# Patient Record
Sex: Male | Born: 1995
Health system: Southern US, Community
[De-identification: ages and names within clinical notes are randomized; demographics above are authoritative.]

## PROBLEM LIST (undated history)

## (undated) DIAGNOSIS — K6289 Other specified diseases of anus and rectum: Secondary | ICD-10-CM

## (undated) DIAGNOSIS — IMO0002 Reserved for concepts with insufficient information to code with codable children: Secondary | ICD-10-CM

## (undated) DIAGNOSIS — E669 Obesity, unspecified: Secondary | ICD-10-CM

## (undated) DIAGNOSIS — H539 Unspecified visual disturbance: Secondary | ICD-10-CM

## (undated) DIAGNOSIS — J189 Pneumonia, unspecified organism: Secondary | ICD-10-CM

## (undated) DIAGNOSIS — B20 Human immunodeficiency virus [HIV] disease: Secondary | ICD-10-CM

## (undated) DIAGNOSIS — F329 Major depressive disorder, single episode, unspecified: Secondary | ICD-10-CM

## (undated) DIAGNOSIS — L0293 Carbuncle, unspecified: Secondary | ICD-10-CM

## (undated) DIAGNOSIS — L0292 Furuncle, unspecified: Secondary | ICD-10-CM

## (undated) HISTORY — DX: Reserved for concepts with insufficient information to code with codable children: IMO0002

## (undated) HISTORY — DX: Major depressive disorder, single episode, unspecified: F32.9

## (undated) HISTORY — PX: EXCISIONAL HEMORRHOIDECTOMY: SHX1541

---
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
## 2015-01-09 ENCOUNTER — Ambulatory Visit: Payer: Medicaid Other | Admitting: Internal Medicine

## 2015-02-03 ENCOUNTER — Encounter (HOSPITAL_COMMUNITY): Payer: Self-pay | Admitting: *Deleted

## 2015-02-03 ENCOUNTER — Emergency Department (HOSPITAL_COMMUNITY)
Admission: EM | Admit: 2015-02-03 | Discharge: 2015-02-03 | Disposition: A | Discharge status: Home / Self Care | Payer: Medicaid Other | Attending: Emergency Medicine | Admitting: Emergency Medicine

## 2015-02-03 DIAGNOSIS — E119 Type 2 diabetes mellitus without complications: Secondary | ICD-10-CM | POA: Insufficient documentation

## 2015-02-03 DIAGNOSIS — J069 Acute upper respiratory infection, unspecified: Secondary | ICD-10-CM | POA: Diagnosis not present

## 2015-02-03 DIAGNOSIS — Z8614 Personal history of Methicillin resistant Staphylococcus aureus infection: Secondary | ICD-10-CM | POA: Insufficient documentation

## 2015-02-03 DIAGNOSIS — E669 Obesity, unspecified: Secondary | ICD-10-CM | POA: Diagnosis not present

## 2015-02-03 DIAGNOSIS — Z79899 Other long term (current) drug therapy: Secondary | ICD-10-CM | POA: Insufficient documentation

## 2015-02-03 DIAGNOSIS — H578 Other specified disorders of eye and adnexa: Secondary | ICD-10-CM | POA: Diagnosis present

## 2015-02-03 DIAGNOSIS — H109 Unspecified conjunctivitis: Secondary | ICD-10-CM | POA: Insufficient documentation

## 2015-02-03 DIAGNOSIS — Z8701 Personal history of pneumonia (recurrent): Secondary | ICD-10-CM | POA: Diagnosis not present

## 2015-02-03 DIAGNOSIS — J45909 Unspecified asthma, uncomplicated: Secondary | ICD-10-CM | POA: Diagnosis not present

## 2015-02-03 DIAGNOSIS — Z87891 Personal history of nicotine dependence: Secondary | ICD-10-CM | POA: Insufficient documentation

## 2015-02-03 DIAGNOSIS — Z21 Asymptomatic human immunodeficiency virus [HIV] infection status: Secondary | ICD-10-CM | POA: Diagnosis not present

## 2015-02-03 DIAGNOSIS — Z794 Long term (current) use of insulin: Secondary | ICD-10-CM | POA: Diagnosis not present

## 2015-02-03 MED ORDER — POLYMYXIN B-TRIMETHOPRIM 10000-0.1 UNIT/ML-% OP SOLN: 1.0000 [drp] | OPHTHALMIC | Status: DC

## 2015-02-03 NOTE — Discharge Instructions (Signed)
Read the information below.  Use the prescribed medication as directed.  Please discuss all new medications with your pharmacist.  You may return to the Emergency Department at any time for worsening condition or any new symptoms that concern you.     If you develop worsening pain in your eye, change in your vision, swelling around your eye, difficulty moving your eye, or fevers greater than 100.4, see your eye doctor or return to the Emergency Department immediately for a recheck.    ° ° °Conjunctivitis °Conjunctivitis is commonly called "pink eye." Conjunctivitis can be caused by bacterial or viral infection, allergies, or injuries. There is usually redness of the lining of the eye, itching, discomfort, and sometimes discharge. There may be deposits of matter along the eyelids. A viral infection usually causes a watery discharge, while a bacterial infection causes a yellowish, thick discharge. Pink eye is very contagious and spreads by direct contact. °You may be given antibiotic eyedrops as part of your treatment. Before using your eye medicine, remove all drainage from the eye by washing gently with warm water and cotton balls. Continue to use the medication until you have awakened 2 mornings in a row without discharge from the eye. Do not rub your eye. This increases the irritation and helps spread infection. Use separate towels from other household members. Wash your hands with soap and water before and after touching your eyes. Use cold compresses to reduce pain and sunglasses to relieve irritation from light. Do not wear contact lenses or wear eye makeup until the infection is gone. °SEEK MEDICAL CARE IF:  °· Your symptoms are not better after 3 days of treatment. °· You have increased pain or trouble seeing. °· The outer eyelids become very red or swollen. °Document Released: 06/03/2004 Document Revised: 07/19/2011 Document Reviewed: 04/26/2005 °ExitCare® Patient Information ©2015 [employer]. This  information is not intended to replace advice given to you by your health care provider. Make sure you discuss any questions you have with your health care provider. ° °

## 2015-02-03 NOTE — ED Provider Notes (Signed)
CSN: [id number]     Arrival date & time 02/03/15  4098 History   This chart was scribed for non-physician practitioner, Trixie Dredge, PA-C, working with Elwin Mocha, MD by Marica Otter, ED Scribe. This patient was seen in room TR10C/TR10C and the patient's care was started at 9:09 AM.   Chief Complaint  Patient presents with  . Eye Drainage   The history is provided by the patient. No language interpreter was used.   PCP: Grayce Sessions, NP HPI Comments: TADAN SHILL is a 19 y.o. male, with PMHx noted below including DM (pt reports glucose levels are controlled (120s per patient)and is compliant with meds), HIV disease (Pt's CD4 count was 470 in 11/2014), who presents to the Emergency Department complaining of sudden onset atraumatic left eye itching onset one week ago following a visit with his opthamologist at [facility] Pt notes the left eye became red and painful this morning. Pt reports using warm compresses at home without relief. Pt further notes a Hx of pink eye and reports his current Sx are consistent with pink eye. Pt also reports an improving cold with associated cough and runny nose onset approximately one week ago. Pt denies SOB, chest pain, fever, Sx associated with right eye, ear pain, fever, or chills.    Past Medical History  Diagnosis Date  . Asthma   . Diabetes mellitus   . Vision abnormalities   . Obesity   . IUGR (intrauterine growth restriction)   . Intrauterine drug exposure   . Proctitis   . HCAP (healthcare-associated pneumonia) 08/31/2014  . HIV disease 09/03/2014  . Recurrent boils     MRSA and MSSA   Past Surgical History  Procedure Laterality Date  . Excisional hemorrhoidectomy    . Foot surgery  1999    ingrown toenail removal   Family History  Problem Relation Age of Onset  . Drug abuse Mother   . Heart disease Mother   . Alcohol abuse Father   . Diabetes Paternal Aunt   . Arthritis Paternal Grandmother   . Stroke Paternal Grandfather   .  Diabetes Paternal Grandfather    Social History  Substance Use Topics  . Smoking status: Former Smoker    Types: Cigarettes    Quit date: 09/03/2014  . Smokeless tobacco: Never Used  . Alcohol Use: No    Review of Systems  Constitutional: Negative for fever and chills.  HENT: Positive for rhinorrhea. Negative for ear pain.   Eyes: Positive for pain, discharge, redness and itching (left eye). Negative for photophobia and visual disturbance.  Respiratory: Positive for cough. Negative for shortness of breath.   Cardiovascular: Negative for chest pain.  Musculoskeletal: Negative for myalgias, neck pain and neck stiffness.  Skin: Negative for rash.  Allergic/Immunologic: Positive for immunocompromised state.  Hematological: Does not bruise/bleed easily.  Psychiatric/Behavioral: Negative for self-injury.      Allergies  Review of patient's allergies indicates no known allergies.  Home Medications   Prior to Admission medications   Medication Sig Start Date End Date Taking? Authorizing Mycal Conde  albuterol (PROVENTIL HFA;VENTOLIN HFA) 108 (90 BASE) MCG/ACT inhaler Inhale 2 puffs into the lungs every 6 (six) hours as needed for wheezing or shortness of breath.     Historical Shaylynne Lunt, MD  elvitegravir-cobicistat-emtricitabine-tenofovir (GENVOYA) 150-150-200-10 MG TABS tablet Take 1 tablet by mouth daily with breakfast. 09/24/14   Cliffton Asters, MD  gabapentin (NEURONTIN) 100 MG capsule Take 2 capsules (200 mg total) by mouth 3 (three) times daily.  10/10/14   Cliffton Asters, MD  insulin aspart (NOVOLOG FLEXPEN) 100 UNIT/ML FlexPen Use up to 50 units daily 11/12/14   Dessa Phi, MD  Insulin Glargine (LANTUS SOLOSTAR) 100 UNIT/ML Solostar Pen Use up to 50 units daily 11/12/14   Dessa Phi, MD  ondansetron (ZOFRAN ODT) 4 MG disintegrating tablet Take 1 tablet (4 mg total) by mouth every 8 (eight) hours as needed for nausea or vomiting. Patient not taking: Reported on 09/10/2014 08/28/14    Vanetta Mulders, MD  potassium chloride SA (K-DUR,KLOR-CON) 20 MEQ tablet Take 1 tablet (20 mEq total) by mouth daily. Patient not taking: Reported on 09/10/2014 09/04/14   Dorothea Ogle, MD  ramipril (ALTACE) 1.25 MG capsule Take 1 capsule (1.25 mg total) by mouth daily. Patient not taking: Reported on 09/24/2014 09/04/14   Dorothea Ogle, MD   Triage Vitals: BP 123/72 mmHg  Pulse 78  Temp(Src) 97.8 F (36.6 C) (Oral)  Resp 18  Ht  (1.753 m)  Wt 180 lb (81.647 kg)  BMI 26.57 kg/m2  SpO2 100% Physical Exam  Constitutional: He appears well-developed and well-nourished. No distress.  HENT:  Head: Normocephalic and atraumatic.  Mouth/Throat: Oropharynx is clear and moist. No oropharyngeal exudate.  Eyes: EOM are normal. Pupils are equal, round, and reactive to light. Right eye exhibits no discharge. Left eye exhibits discharge (Dry crusted white discharge. ). Left conjunctiva is injected.  Left Eye: No periorbital erythema, edema and tenderness   Neck: Normal range of motion. Neck supple.  Cardiovascular: Normal rate and regular rhythm.   Pulmonary/Chest: Effort normal and breath sounds normal. No stridor. No respiratory distress. He has no wheezes. He has no rales.  Lymphadenopathy:    He has no cervical adenopathy.  Neurological: He is alert.  Skin: He is not diaphoretic.  Nursing note and vitals reviewed.   ED Course  Procedures (including critical care time) DIAGNOSTIC STUDIES: Oxygen Saturation is 100% on RA, nl by my interpretation.    COORDINATION OF CARE: 9:14 AM: Discussed treatment plan which includes antibacterial drops and with pt at bedside; patient verbalizes understanding and agrees with treatment plan.  MDM   Final diagnoses:  Conjunctivitis of left eye  URI (upper respiratory infection)    Afebrile, nontoxic patient with constellation of symptoms suggestive of viral syndrome and left eye conjunctivitis.  No concerning findings on exam.  Discharged home with  supportive care, PCP follow up.  Discussed result, findings, treatment, and follow up  with patient.  Pt given return precautions.  Pt verbalizes understanding and agrees with plan.      I personally performed the services described in this documentation, which was scribed in my presence. The recorded information has been reviewed and is accurate.    Trixie Dredge, PA-C 02/03/15 1415  Elwin Mocha, MD 02/03/15 1524

## 2015-02-03 NOTE — ED Notes (Signed)
PT reports a one week HX of LT eye itching . The itching started after he went to his Eye doctor . Pt goes to [facility] for eye exams.Pt did not follow up wit eye doctor for sx's

## 2015-02-03 NOTE — ED Notes (Signed)
Declined W/C at D/C and was escorted to lobby by RN. 

## 2015-04-15 ENCOUNTER — Other Ambulatory Visit: Payer: Self-pay

## 2015-04-15 DIAGNOSIS — B20 Human immunodeficiency virus [HIV] disease: Secondary | ICD-10-CM

## 2015-04-16 ENCOUNTER — Other Ambulatory Visit: Payer: Medicaid Other

## 2015-04-17 ENCOUNTER — Other Ambulatory Visit: Payer: Self-pay | Admitting: *Deleted

## 2015-04-17 ENCOUNTER — Other Ambulatory Visit: Payer: Medicaid Other

## 2015-04-20 ENCOUNTER — Emergency Department (HOSPITAL_COMMUNITY)
Admission: EM | Admit: 2015-04-20 | Discharge: 2015-04-21 | Disposition: A | Discharge status: Home / Self Care | Payer: Medicaid Other | Attending: Emergency Medicine | Admitting: Emergency Medicine

## 2015-04-20 ENCOUNTER — Encounter (HOSPITAL_COMMUNITY): Payer: Self-pay

## 2015-04-20 DIAGNOSIS — Z87891 Personal history of nicotine dependence: Secondary | ICD-10-CM | POA: Insufficient documentation

## 2015-04-20 DIAGNOSIS — Z79899 Other long term (current) drug therapy: Secondary | ICD-10-CM | POA: Insufficient documentation

## 2015-04-20 DIAGNOSIS — L0390 Cellulitis, unspecified: Secondary | ICD-10-CM

## 2015-04-20 DIAGNOSIS — Z8614 Personal history of Methicillin resistant Staphylococcus aureus infection: Secondary | ICD-10-CM | POA: Diagnosis not present

## 2015-04-20 DIAGNOSIS — E669 Obesity, unspecified: Secondary | ICD-10-CM | POA: Insufficient documentation

## 2015-04-20 DIAGNOSIS — E119 Type 2 diabetes mellitus without complications: Secondary | ICD-10-CM | POA: Insufficient documentation

## 2015-04-20 DIAGNOSIS — Z8719 Personal history of other diseases of the digestive system: Secondary | ICD-10-CM | POA: Diagnosis not present

## 2015-04-20 DIAGNOSIS — Z21 Asymptomatic human immunodeficiency virus [HIV] infection status: Secondary | ICD-10-CM | POA: Insufficient documentation

## 2015-04-20 DIAGNOSIS — L0291 Cutaneous abscess, unspecified: Secondary | ICD-10-CM

## 2015-04-20 DIAGNOSIS — L03116 Cellulitis of left lower limb: Secondary | ICD-10-CM | POA: Insufficient documentation

## 2015-04-20 DIAGNOSIS — J45909 Unspecified asthma, uncomplicated: Secondary | ICD-10-CM | POA: Insufficient documentation

## 2015-04-20 DIAGNOSIS — Z8701 Personal history of pneumonia (recurrent): Secondary | ICD-10-CM | POA: Insufficient documentation

## 2015-04-20 DIAGNOSIS — Z794 Long term (current) use of insulin: Secondary | ICD-10-CM | POA: Insufficient documentation

## 2015-04-20 DIAGNOSIS — L02416 Cutaneous abscess of left lower limb: Secondary | ICD-10-CM | POA: Insufficient documentation

## 2015-04-20 DIAGNOSIS — Z8669 Personal history of other diseases of the nervous system and sense organs: Secondary | ICD-10-CM | POA: Insufficient documentation

## 2015-04-20 MED ORDER — LIDOCAINE-EPINEPHRINE (PF) 2 %-1:200000 IJ SOLN
20.0000 mL | Freq: Once | INTRAMUSCULAR | Status: AC
  Administered 2015-04-20: 20 mL
  Filled 2015-04-20: qty 20

## 2015-04-20 MED ORDER — CEPHALEXIN 500 MG PO CAPS: 500.0000 mg | ORAL_CAPSULE | Freq: Two times a day (BID) | ORAL | Status: DC

## 2015-04-20 NOTE — ED Notes (Signed)
Pt has an abscess to his left lower leg posterior calf area. States he thought he got all the drainage out but it swoll back up.

## 2015-04-20 NOTE — Discharge Instructions (Signed)

## 2015-04-20 NOTE — ED Provider Notes (Signed)
CSN: [id number]     Arrival date & time 04/20/15  2250 History   First MD Initiated Contact with Patient 04/20/15 2308     Chief Complaint  Patient presents with  . Abscess   Scott Baxter is a 19 y.o. male who is HIV positive on HAART who presents to the emergency department complaining of a abscess to his left lateral calf for the past 2 days. Patient reports he had drainage from the site yesterday and today it has increased in size. He finds of mild pain to the area today. He denies anything for treatment today. He denies history of previous abscesses to this area. He reports abscesses before, but none recently. Denies fevers, chills, numbness, tingling, weakness, or injury to his leg. Reports his last tetanus was less than a year ago.  (Consider location/radiation/quality/duration/timing/severity/associated sxs/prior Treatment) HPI  Past Medical History  Diagnosis Date  . Asthma   . Diabetes mellitus   . Vision abnormalities   . Obesity   . IUGR (intrauterine growth restriction)   . Intrauterine drug exposure   . Proctitis   . HCAP (healthcare-associated pneumonia) 08/31/2014  . HIV disease (HCC) 09/03/2014  . Recurrent boils     MRSA and MSSA   Past Surgical History  Procedure Laterality Date  . Excisional hemorrhoidectomy    . Foot surgery  1999    ingrown toenail removal   Family History  Problem Relation Age of Onset  . Drug abuse Mother   . Heart disease Mother   . Alcohol abuse Father   . Diabetes Paternal Aunt   . Arthritis Paternal Grandmother   . Stroke Paternal Grandfather   . Diabetes Paternal Grandfather    Social History  Substance Use Topics  . Smoking status: Former Smoker    Types: Cigarettes    Quit date: 09/03/2014  . Smokeless tobacco: Never Used  . Alcohol Use: No    Review of Systems  Constitutional: Negative for fever.  Musculoskeletal: Negative for joint swelling and arthralgias.  Skin: Positive for wound.  Neurological: Negative for  weakness and numbness.      Allergies  Review of patient's allergies indicates no known allergies.  Home Medications   Prior to Admission medications   Medication Sig Start Date End Date Taking? Authorizing Provider  albuterol (PROVENTIL HFA;VENTOLIN HFA) 108 (90 BASE) MCG/ACT inhaler Inhale 2 puffs into the lungs every 6 (six) hours as needed for wheezing or shortness of breath.    Yes Historical Provider, MD  elvitegravir-cobicistat-emtricitabine-tenofovir (GENVOYA) 150-150-200-10 MG TABS tablet Take 1 tablet by mouth daily with breakfast. 09/24/14  Yes Cliffton Asters, MD  insulin aspart (NOVOLOG FLEXPEN) 100 UNIT/ML FlexPen Use up to 50 units daily Patient taking differently: Inject 0-50 Units into the skin 4 (four) times daily as needed for high blood sugar. Use up to 50 units daily 11/12/14  Yes Dessa Phi, MD  Insulin Glargine (LANTUS SOLOSTAR) 100 UNIT/ML Solostar Pen Use up to 50 units daily Patient taking differently: Inject 0-50 Units into the skin at bedtime. Use up to 50 units daily 11/12/14  Yes Dessa Phi, MD  cephALEXin (KEFLEX) 500 MG capsule Take 1 capsule (500 mg total) by mouth 2 (two) times daily. 04/20/15   Everlene Farrier, PA-C   BP 135/86 mmHg  Pulse 86  Temp(Src) 97.9 F (36.6 C) (Oral)  Resp 16  Ht  (1.778 m)  Wt 89.132 kg  BMI 28.19 kg/m2  SpO2 96% Physical Exam  Constitutional: He appears well-developed and  well-nourished. No distress.  Nontoxic appearing.  HENT:  Head: Normocephalic and atraumatic.  Eyes: Right eye exhibits no discharge. Left eye exhibits no discharge.  Cardiovascular:  Bilateral posterior tibialis and dorsalis pedis pulses are intact.  Pulmonary/Chest: Effort normal. No respiratory distress.  Musculoskeletal: Normal range of motion. He exhibits no edema or tenderness.  Patient is good strength in his bilateral lower extremity's. He has good strength in plantar and dorsiflexion of his bilateral feet.  Neurological: He is  alert. Coordination normal.  Sensation is intact to his bilateral lower extremities.   Skin: Skin is warm and dry. No rash noted. He is not diaphoretic. There is erythema. No pallor.  Patient has a large 4 cm indurated and fluctuant abscess to his left lateral lower leg. There is overlying erythema. No surrounding erythema. No discharge noted at this time. No streaking redness.  Psychiatric: He has a normal mood and affect. His behavior is normal.  Nursing note and vitals reviewed.   ED Course  .Marland Kitchen.Incision and Drainage Date/Time: 04/20/2015 11:40 PM Performed by: Everlene FarrierANSIE, Marrion Accomando Authorized by: Everlene FarrierANSIE, Arleta Ostrum Consent: Verbal consent obtained. Risks and benefits: risks, benefits and alternatives were discussed Consent given by: patient Patient understanding: patient states understanding of the procedure being performed Patient consent: the patient's understanding of the procedure matches consent given Procedure consent: procedure consent matches procedure scheduled Relevant documents: relevant documents present and verified Site marked: the operative site was marked Required items: required blood products, implants, devices, and special equipment available Patient identity confirmed: verbally with patient Time out: Immediately prior to procedure a "time out" was called to verify the correct patient, procedure, equipment, support staff and site/side marked as required. Type: abscess Body area: lower extremity Location details: left leg Anesthesia: local infiltration Local anesthetic: lidocaine 2% with epinephrine Anesthetic total: 2 ml Patient sedated: no Scalpel size: 11 Incision type: single straight Incision depth: dermal Complexity: simple Drainage: purulent Drainage amount: copious Wound treatment: wound left open Packing material: none Patient tolerance: Patient tolerated the procedure well with no immediate complications   (including critical care time) Labs Review Labs  Reviewed - No data to display  Imaging Review No results found. EMERGENCY DEPARTMENT US SOFT TISSUE INTERPRETATION "Study: Limited Ultrasound of the noted body part in comments below"  INDICATIONS: Soft tissue infection Multiple views of the body part are obtained with a multi-frequency linear probe  PERFORMED BY:  Myself  IMAGES ARCHIVED?: Yes  SIDE:Left  BODY PART:Lower extremity  FINDINGS: Abcess present  LIMITATIONS:  N/A  INTERPRETATION:  Abcess present and some evidence of cellulitis.   COMMENT:  Abscess with cellulitis of left lower leg.   .   EKG Interpretation None      Filed Vitals:   04/20/15 2258  BP: 135/86  Pulse: 86  Temp: 97.9 F (36.6 C)  TempSrc: Oral  Resp: 16  Height: 5\' 10"  (1.778 m)  Weight: 89.132 kg  SpO2: 96%     MDM   Meds given in ED:  Medications  lidocaine-EPINEPHrine (XYLOCAINE W/EPI) 2 %-1:200000 (PF) injection 20 mL (20 mLs Infiltration Given 04/20/15 2322)    New Prescriptions   CEPHALEXIN (KEFLEX) 500 MG CAPSULE    Take 1 capsule (500 mg total) by mouth 2 (two) times daily.    Final diagnoses:  Abscess and cellulitis   This is a 19 y.o. male who is HIV positive on HAART who presents to the emergency department complaining of a abscess to his left lateral calf for the past 2 days.  Patient reports he had drainage from the site yesterday and today it has increased in size. He finds of mild pain to the area today. He denies anything for treatment today. He denies history of previous abscesses to this area. He reports abscesses before, but none recently. Denies fevers.  On exam patient is afebrile nontoxic appearing. The patient appears to have a for similar fluctuant indurated abscess to his left lateral calf. No discharge noted. There is overlying erythema but no surrounding erythema. No streaking erythema. On physical exam there appears to be no cellulitis. On ultrasound at bedside there does appear to be some cobblestoning,  indicating some evidence of cellulitis. Incision and drainage performed by me tolerate well by the patient. Has her some evidence of cellulitis on ultrasound will prescribe the patient Keflex and encourage close follow up in 2 days by his primary care provider to have his wound rechecked. I discussed wound care. I discussed return precautions. I advised the patient to follow-up with their primary care provider this week. I advised the patient to return to the emergency department with new or worsening symptoms or new concerns. The patient verbalized understanding and agreement with plan.        Everlene Farrier, PA-C 04/20/15 2358  Raeford Razor, MD 04/21/15 [phone]

## 2015-04-21 NOTE — ED Notes (Signed)
Pt A&Ox4, ambulatory at d/c with steady gait, NAD and states he has all of his belongings with him at d/c 

## 2015-04-29 ENCOUNTER — Ambulatory Visit: Payer: Medicaid Other | Admitting: Internal Medicine

## 2015-05-11 ENCOUNTER — Encounter (HOSPITAL_COMMUNITY): Payer: Self-pay | Admitting: *Deleted

## 2015-05-11 ENCOUNTER — Emergency Department (HOSPITAL_COMMUNITY)
Admission: EM | Admit: 2015-05-11 | Discharge: 2015-05-11 | Disposition: A | Discharge status: Home / Self Care | Payer: Medicaid Other | Attending: Emergency Medicine | Admitting: Emergency Medicine

## 2015-05-11 ENCOUNTER — Emergency Department (HOSPITAL_COMMUNITY): Payer: Medicaid Other

## 2015-05-11 DIAGNOSIS — R0789 Other chest pain: Secondary | ICD-10-CM | POA: Diagnosis not present

## 2015-05-11 DIAGNOSIS — E669 Obesity, unspecified: Secondary | ICD-10-CM | POA: Insufficient documentation

## 2015-05-11 DIAGNOSIS — E1165 Type 2 diabetes mellitus with hyperglycemia: Secondary | ICD-10-CM | POA: Insufficient documentation

## 2015-05-11 DIAGNOSIS — Z79899 Other long term (current) drug therapy: Secondary | ICD-10-CM | POA: Insufficient documentation

## 2015-05-11 DIAGNOSIS — Z8669 Personal history of other diseases of the nervous system and sense organs: Secondary | ICD-10-CM | POA: Diagnosis not present

## 2015-05-11 DIAGNOSIS — J45909 Unspecified asthma, uncomplicated: Secondary | ICD-10-CM | POA: Insufficient documentation

## 2015-05-11 DIAGNOSIS — Z794 Long term (current) use of insulin: Secondary | ICD-10-CM | POA: Diagnosis not present

## 2015-05-11 DIAGNOSIS — J069 Acute upper respiratory infection, unspecified: Secondary | ICD-10-CM | POA: Diagnosis not present

## 2015-05-11 DIAGNOSIS — Z87891 Personal history of nicotine dependence: Secondary | ICD-10-CM | POA: Diagnosis not present

## 2015-05-11 DIAGNOSIS — Z8719 Personal history of other diseases of the digestive system: Secondary | ICD-10-CM | POA: Diagnosis not present

## 2015-05-11 DIAGNOSIS — R739 Hyperglycemia, unspecified: Secondary | ICD-10-CM

## 2015-05-11 DIAGNOSIS — Z8614 Personal history of Methicillin resistant Staphylococcus aureus infection: Secondary | ICD-10-CM | POA: Diagnosis not present

## 2015-05-11 DIAGNOSIS — Z21 Asymptomatic human immunodeficiency virus [HIV] infection status: Secondary | ICD-10-CM | POA: Insufficient documentation

## 2015-05-11 DIAGNOSIS — Z8701 Personal history of pneumonia (recurrent): Secondary | ICD-10-CM | POA: Diagnosis not present

## 2015-05-11 DIAGNOSIS — R05 Cough: Secondary | ICD-10-CM | POA: Diagnosis present

## 2015-05-11 LAB — BASIC METABOLIC PANEL
ANION GAP: 15 (ref 5–15)
BUN: 9 mg/dL (ref 6–20)
CHLORIDE: 97 mmol/L — AB (ref 101–111)
CO2: 23 mmol/L (ref 22–32)
CREATININE: 0.9 mg/dL (ref 0.61–1.24)
Calcium: 9.1 mg/dL (ref 8.9–10.3)
Glucose, Bld: 319 mg/dL — ABNORMAL HIGH (ref 65–99)
Potassium: 5.2 mmol/L — ABNORMAL HIGH (ref 3.5–5.1)
SODIUM: 135 mmol/L (ref 135–145)

## 2015-05-11 LAB — CBC WITH DIFFERENTIAL/PLATELET
BASOS ABS: 0 10*3/uL (ref 0.0–0.1)
BASOS PCT: 1 %
EOS ABS: 0 10*3/uL (ref 0.0–0.7)
Eosinophils Relative: 0 %
HEMATOCRIT: 45.3 % (ref 39.0–52.0)
HEMOGLOBIN: 15.4 g/dL (ref 13.0–17.0)
Lymphocytes Relative: 53 %
Lymphs Abs: 1.8 10*3/uL (ref 0.7–4.0)
MCH: 29.1 pg (ref 26.0–34.0)
MCHC: 34 g/dL (ref 30.0–36.0)
MCV: 85.5 fL (ref 78.0–100.0)
MONOS PCT: 10 %
Monocytes Absolute: 0.3 10*3/uL (ref 0.1–1.0)
NEUTROS ABS: 1.2 10*3/uL — AB (ref 1.7–7.7)
NEUTROS PCT: 36 %
Platelets: 158 10*3/uL (ref 150–400)
RBC: 5.3 MIL/uL (ref 4.22–5.81)
RDW: 12.5 % (ref 11.5–15.5)
WBC: 3.4 10*3/uL — AB (ref 4.0–10.5)

## 2015-05-11 LAB — I-STAT TROPONIN, ED: Troponin i, poc: 0 ng/mL (ref 0.00–0.08)

## 2015-05-11 MED ORDER — ALBUTEROL SULFATE HFA 108 (90 BASE) MCG/ACT IN AERS: 2.0000 | INHALATION_SPRAY | RESPIRATORY_TRACT | Status: AC | PRN

## 2015-05-11 MED ORDER — ALBUTEROL SULFATE (2.5 MG/3ML) 0.083% IN NEBU
5.0000 mg | INHALATION_SOLUTION | Freq: Once | RESPIRATORY_TRACT | Status: AC
  Administered 2015-05-11: 5 mg via RESPIRATORY_TRACT
  Filled 2015-05-11: qty 6

## 2015-05-11 MED ORDER — PREDNISONE 20 MG PO TABS
60.0000 mg | ORAL_TABLET | Freq: Once | ORAL | Status: AC
  Administered 2015-05-11: 60 mg via ORAL
  Filled 2015-05-11: qty 3

## 2015-05-11 MED ORDER — SODIUM CHLORIDE 0.9 % IV BOLUS (SEPSIS)
1000.0000 mL | Freq: Once | INTRAVENOUS | Status: AC
Start: 2015-05-11 — End: 2015-05-11
  Administered 2015-05-11: 1000 mL via INTRAVENOUS

## 2015-05-11 MED ORDER — AZITHROMYCIN 250 MG PO TABS: ORAL_TABLET | ORAL | Status: DC

## 2015-05-11 MED ORDER — IBUPROFEN 400 MG PO TABS
800.0000 mg | ORAL_TABLET | Freq: Once | ORAL | Status: AC
  Administered 2015-05-11: 800 mg via ORAL
  Filled 2015-05-11: qty 2

## 2015-05-11 NOTE — ED Notes (Signed)
Pt reports recent productive cough. Has chest pain that occurs when breathing and coughing, and reports fever/chills.

## 2015-05-11 NOTE — ED Provider Notes (Signed)
CSN: [id number]     Arrival date & time 05/11/15  1111 History   First MD Initiated Contact with Patient 05/11/15 1258     Chief Complaint  Patient presents with  . Chest Pain     (Consider location/radiation/quality/duration/timing/severity/associated sxs/prior Treatment) HPI  Blood pressure 128/89, pulse 80, temperature 97.9 F (36.6 C), temperature source Oral, resp. rate 18, SpO2 99 %.  Scott Baxter is a 20 y.o. male with past medical history significant for HIV (CD4 count was 470 in 11/2014) on HAART, type 1 diabetes complaining of substernal sharp, 4/10 chest pain exacerbated by cough onset 1 week ago. Associated with productive cough, shortness of breath and lightheaded sensation when he coughs. Patient denies fever, hemoptysis, recent immobilizations, calf pain, leg swelling, cocaine or methamphetamine abuse, family history of early cardiac death. On review of systems he notes a shortness of breath, chills, nasal congestion. She also has history of asthma but states that this does not feel like an asthma exacerbation. He smokes daily. States he has not been eating and drinking normally over the past few days because he has no appetite. No immiediate family history of early cardiac death however maternal aunt died of a massive heart attack at 5946. Patient ran out of his albuterol inhaler 2 days ago.   Past Medical History  Diagnosis Date  . Asthma   . Diabetes mellitus   . Vision abnormalities   . Obesity   . IUGR (intrauterine growth restriction)   . Intrauterine drug exposure   . Proctitis   . HCAP (healthcare-associated pneumonia) 08/31/2014  . HIV disease (HCC) 09/03/2014  . Recurrent boils     MRSA and MSSA   Past Surgical History  Procedure Laterality Date  . Excisional hemorrhoidectomy    . Foot surgery  1999    ingrown toenail removal   Family History  Problem Relation Age of Onset  . Drug abuse Mother   . Heart disease Mother   . Alcohol abuse Father   .  Diabetes Paternal Aunt   . Arthritis Paternal Grandmother   . Stroke Paternal Grandfather   . Diabetes Paternal Grandfather    Social History  Substance Use Topics  . Smoking status: Former Smoker    Types: Cigarettes    Quit date: 09/03/2014  . Smokeless tobacco: Never Used  . Alcohol Use: No    Review of Systems  10 systems reviewed and found to be negative, except as noted in the HPI.   Allergies  Review of patient's allergies indicates no known allergies.  Home Medications   Prior to Admission medications   Medication Sig Start Date End Date Taking? Authorizing Provider  elvitegravir-cobicistat-emtricitabine-tenofovir (GENVOYA) 150-150-200-10 MG TABS tablet Take 1 tablet by mouth daily with breakfast. 09/24/14  Yes Cliffton AstersJohn Campbell, MD  insulin aspart (NOVOLOG FLEXPEN) 100 UNIT/ML FlexPen Use up to 50 units daily Patient taking differently: Inject 0-50 Units into the skin 4 (four) times daily as needed for high blood sugar. Use up to 50 units daily 11/12/14  Yes Dessa PhiJennifer Badik, MD  Insulin Glargine (LANTUS SOLOSTAR) 100 UNIT/ML Solostar Pen Use up to 50 units daily Patient taking differently: Inject 0-50 Units into the skin at bedtime. Use up to 50 units daily 11/12/14  Yes Dessa PhiJennifer Badik, MD  albuterol (PROVENTIL HFA;VENTOLIN HFA) 108 (90 Base) MCG/ACT inhaler Inhale 2 puffs into the lungs every 4 (four) hours as needed for wheezing or shortness of breath. 05/11/15   Quiana Cobaugh, PA-C  azithromycin (ZITHROMAX Z-PAK) 250  MG tablet 2 po day one, then 1 daily x 4 days 05/11/15   Joni Reining Job Holtsclaw, PA-C   BP 128/89 mmHg  Pulse 80  Temp(Src) 97.9 F (36.6 C) (Oral)  Resp 18  SpO2 99% Physical Exam  Constitutional: He is oriented to person, place, and time. He appears well-developed and well-nourished. No distress.  HENT:  Head: Normocephalic.  Mouth/Throat: Oropharynx is clear and moist.  Eyes: Conjunctivae and EOM are normal.  Neck: Normal range of motion. No JVD present. No  tracheal deviation present.  Cardiovascular: Normal rate, regular rhythm and intact distal pulses.   Pulmonary/Chest: Effort normal and breath sounds normal. No stridor. No respiratory distress. He has no wheezes. He has no rales. He exhibits tenderness.    Abdominal: Soft. He exhibits no distension and no mass. There is no tenderness. There is no rebound and no guarding.  Musculoskeletal: Normal range of motion. He exhibits no edema or tenderness.  No calf asymmetry, superficial collaterals, palpable cords, edema, Homans sign negative bilaterally.    Neurological: He is alert and oriented to person, place, and time.  Skin: Skin is warm. He is not diaphoretic.  Psychiatric: He has a normal mood and affect.  Nursing note and vitals reviewed.   ED Course  Procedures (including critical care time) Labs Review Labs Reviewed  CBC WITH DIFFERENTIAL/PLATELET - Abnormal; Notable for the following:    WBC 3.4 (*)    Neutro Abs 1.2 (*)    All other components within normal limits  BASIC METABOLIC PANEL - Abnormal; Notable for the following:    Potassium 5.2 (*)    Chloride 97 (*)    Glucose, Bld 319 (*)    All other components within normal limits  I-STAT TROPOININ, ED    Imaging Review Dg Chest 2 View  05/11/2015  CLINICAL DATA:  Chest pain for 3 days EXAM: CHEST  2 VIEW COMPARISON:  08/30/2014 FINDINGS: The heart size and mediastinal contours are within normal limits. Both lungs are clear. The visualized skeletal structures are unremarkable. IMPRESSION: No active cardiopulmonary disease. Electronically Signed   By: Jolaine Click M.D.   On: 05/11/2015 13:19   I have personally reviewed and evaluated these images and lab results as part of my medical decision-making.   EKG Interpretation None      MDM   Final diagnoses:  URI (upper respiratory infection)  Chest wall pain    Filed Vitals:   05/11/15 1132  BP: 128/89  Pulse: 80  Temp: 97.9 F (36.6 C)  TempSrc: Oral  Resp:  18  SpO2: 99%    Medications  albuterol (PROVENTIL) (2.5 MG/3ML) 0.083% nebulizer solution 5 mg (5 mg Nebulization Given 05/11/15 1445)  predniSONE (DELTASONE) tablet 60 mg (60 mg Oral Given 05/11/15 1444)  ibuprofen (ADVIL,MOTRIN) tablet 800 mg (800 mg Oral Given 05/11/15 1444)  sodium chloride 0.9 % bolus 1,000 mL (1,000 mLs Intravenous New Bag/Given 05/11/15 1437)    Scott Baxter is 20 y.o. male presenting with pleuritic retrosternal chest pain onset 1 week ago with associated productive cough, shortness of breath, and nasal congestion. Patient is tender on the sternum, chest x-rays without infiltrate. Patient is low risk by heart score. Pt is low risk by Wells Criteria and PERC negative.  EKG with no ischemic changes, troponin negative, blood work reassuring with a hyperglycemia without ketosis and a mild hyperkalemia of 5.2. Patient's last CD4 count was on a defining, I do not think this is a PCP pneumonia. Likely URI, considering  the length of his symptoms will start on a Z-Pak, high-dose Motrin for pain control.   Evaluation does not show pathology that would require ongoing emergent intervention or inpatient treatment. Pt is hemodynamically stable and mentating appropriately. Discussed findings and plan with patient/guardian, who agrees with care plan. All questions answered. Return precautions discussed and outpatient follow up given.   New Prescriptions   ALBUTEROL (PROVENTIL HFA;VENTOLIN HFA) 108 (90 BASE) MCG/ACT INHALER    Inhale 2 puffs into the lungs every 4 (four) hours as needed for wheezing or shortness of breath.   AZITHROMYCIN (ZITHROMAX Z-PAK) 250 MG TABLET    2 po day one, then 1 daily x 4 days         Wynetta Emery, PA-C 05/11/15 1619  Cathren Laine, MD 05/13/15 [phone]

## 2015-05-11 NOTE — ED Notes (Signed)
Called and no answer also checked sub waiting. 

## 2015-05-11 NOTE — Discharge Instructions (Signed)
Please follow with your primary care doctor in the next 2 days for a check-up. They must obtain records for further management.  ° °Do not hesitate to return to the Emergency Department for any new, worsening or concerning symptoms.  ° ° °Cough, Adult °Coughing is a reflex that clears your throat and your airways. Coughing helps to heal and protect your lungs. It is normal to cough occasionally, but a cough that happens with other symptoms or lasts a long time may be a sign of a condition that needs treatment. A cough may last only 2-3 weeks (acute), or it may last longer than 8 weeks (chronic). °CAUSES °Coughing is commonly caused by: °· Breathing in substances that irritate your lungs. °· A viral or bacterial respiratory infection. °· Allergies. °· Asthma. °· Postnasal drip. °· Smoking. °· Acid backing up from the stomach into the esophagus (gastroesophageal reflux). °· Certain medicines. °· Chronic lung problems, including COPD (or rarely, lung cancer). °· Other medical conditions such as heart failure. °HOME CARE INSTRUCTIONS  °Pay attention to any changes in your symptoms. Take these actions to help with your discomfort: °· Take medicines only as told by your health care provider. °¨ If you were prescribed an antibiotic medicine, take it as told by your health care provider. Do not stop taking the antibiotic even if you start to feel better. °¨ Talk with your health care provider before you take a cough suppressant medicine. °· Drink enough fluid to keep your urine clear or pale yellow. °· If the air is dry, use a cold steam vaporizer or humidifier in your bedroom or your home to help loosen secretions. °· Avoid anything that causes you to cough at work or at home. °· If your cough is worse at night, try sleeping in a semi-upright position. °· Avoid cigarette smoke. If you smoke, quit smoking. If you need help quitting, ask your health care provider. °· Avoid caffeine. °· Avoid alcohol. °· Rest as needed. °SEEK  MEDICAL CARE IF:  °· You have new symptoms. °· You cough up pus. °· Your cough does not get better after 2-3 weeks, or your cough gets worse. °· You cannot control your cough with suppressant medicines and you are losing sleep. °· You develop pain that is getting worse or pain that is not controlled with pain medicines. °· You have a fever. °· You have unexplained weight loss. °· You have night sweats. °SEEK IMMEDIATE MEDICAL CARE IF: °· You cough up blood. °· You have difficulty breathing. °· Your heartbeat is very fast. °  °This information is not intended to replace advice given to you by your health care provider. Make sure you discuss any questions you have with your health care provider. °  °Document Released: 10/23/2010 Document Revised: 01/15/2015 Document Reviewed: 07/03/2014 °Elsevier Interactive Patient Education ©2016 Elsevier Inc. ° °

## 2015-05-11 NOTE — ED Notes (Signed)
IV attempt x 2 w/o success. Second RN asked to try.

## 2015-08-18 ENCOUNTER — Emergency Department (HOSPITAL_COMMUNITY)
Admission: EM | Admit: 2015-08-18 | Discharge: 2015-08-18 | Disposition: A | Discharge status: Home / Self Care | Payer: Medicaid Other | Attending: Emergency Medicine | Admitting: Emergency Medicine

## 2015-08-18 ENCOUNTER — Encounter (HOSPITAL_COMMUNITY): Payer: Self-pay | Admitting: *Deleted

## 2015-08-18 DIAGNOSIS — J45909 Unspecified asthma, uncomplicated: Secondary | ICD-10-CM | POA: Diagnosis not present

## 2015-08-18 DIAGNOSIS — Z87891 Personal history of nicotine dependence: Secondary | ICD-10-CM | POA: Diagnosis not present

## 2015-08-18 DIAGNOSIS — Z79899 Other long term (current) drug therapy: Secondary | ICD-10-CM | POA: Diagnosis not present

## 2015-08-18 DIAGNOSIS — L02411 Cutaneous abscess of right axilla: Secondary | ICD-10-CM | POA: Diagnosis not present

## 2015-08-18 DIAGNOSIS — E669 Obesity, unspecified: Secondary | ICD-10-CM | POA: Insufficient documentation

## 2015-08-18 DIAGNOSIS — Z8669 Personal history of other diseases of the nervous system and sense organs: Secondary | ICD-10-CM | POA: Diagnosis not present

## 2015-08-18 DIAGNOSIS — Z8719 Personal history of other diseases of the digestive system: Secondary | ICD-10-CM | POA: Insufficient documentation

## 2015-08-18 DIAGNOSIS — Z794 Long term (current) use of insulin: Secondary | ICD-10-CM | POA: Diagnosis not present

## 2015-08-18 DIAGNOSIS — Z8701 Personal history of pneumonia (recurrent): Secondary | ICD-10-CM | POA: Diagnosis not present

## 2015-08-18 DIAGNOSIS — E119 Type 2 diabetes mellitus without complications: Secondary | ICD-10-CM | POA: Diagnosis not present

## 2015-08-18 DIAGNOSIS — Z21 Asymptomatic human immunodeficiency virus [HIV] infection status: Secondary | ICD-10-CM | POA: Insufficient documentation

## 2015-08-18 MED ORDER — LIDOCAINE-EPINEPHRINE (PF) 2 %-1:200000 IJ SOLN
10.0000 mL | Freq: Once | INTRAMUSCULAR | Status: AC
  Administered 2015-08-18: 10 mL via INTRADERMAL
  Filled 2015-08-18: qty 20

## 2015-08-18 NOTE — ED Provider Notes (Signed)
By signing my name below, I, Freida BusmanDiana Omoyeni, attest that this documentation has been prepared under the direction and in the presence of Kristen N Ward, DO . Electronically Signed: Freida Busmaniana Omoyeni, Scribe. 08/18/2015. 4:11 AM.  TIME SEEN: 4:01 AM  CHIEF COMPLAINT:  Chief Complaint  Patient presents with  . Abscess    HPI:   HPI Comments:  Scott Baxter is a 20 y.o. male with a history of HIV (CD4 470 and VL 78 in July 2016, on Anti-retroviral therapy), DM, who presents to the Emergency Department complaining of an abscess to his right axilla which he first noticed 4 days ago. He reports associated surrounding 6/10 pain.  Pt also reports a h/o same to the same area, as well as to his LE which have required incision and drainage. He denies fever, nausea and vomiting. No alleviating factors noted. Pt has no other complaints or symptoms at this time. Reports blood sugars have been normal.  NKDA   ROS: See HPI Constitutional: no fever  Eyes: no drainage  ENT: no runny nose   Cardiovascular:  no chest pain  Resp: no SOB  GI: no vomiting GU: no dysuria Integumentary: no rash  Allergy: no hives  Musculoskeletal: no leg swelling  Neurological: no slurred speech ROS otherwise negative  PAST MEDICAL HISTORY/PAST SURGICAL HISTORY:  Past Medical History  Diagnosis Date  . Asthma   . Diabetes mellitus   . Vision abnormalities   . Obesity   . IUGR (intrauterine growth restriction)   . Intrauterine drug exposure   . Proctitis   . HCAP (healthcare-associated pneumonia) 08/31/2014  . HIV disease (HCC) 09/03/2014  . Recurrent boils     MRSA and MSSA    MEDICATIONS:  Prior to Admission medications   Medication Sig Start Date End Date Taking? Authorizing Provider  albuterol (PROVENTIL HFA;VENTOLIN HFA) 108 (90 Base) MCG/ACT inhaler Inhale 2 puffs into the lungs every 4 (four) hours as needed for wheezing or shortness of breath. 05/11/15  Yes Nicole Pisciotta, PA-C   elvitegravir-cobicistat-emtricitabine-tenofovir (GENVOYA) 150-150-200-10 MG TABS tablet Take 1 tablet by mouth daily with breakfast. 09/24/14  Yes Cliffton AstersJohn Campbell, MD  insulin aspart (NOVOLOG FLEXPEN) 100 UNIT/ML FlexPen Use up to 50 units daily Patient taking differently: Inject 0-50 Units into the skin 3 (three) times daily with meals.  11/12/14  Yes Dessa PhiJennifer Badik, MD  Insulin Glargine (LANTUS SOLOSTAR) 100 UNIT/ML Solostar Pen Use up to 50 units daily Patient taking differently: Inject 25 Units into the skin at bedtime.  11/12/14  Yes Dessa PhiJennifer Badik, MD  azithromycin (ZITHROMAX Z-PAK) 250 MG tablet 2 po day one, then 1 daily x 4 days Patient not taking: Reported on 08/18/2015 05/11/15   Joni ReiningNicole Pisciotta, PA-C    ALLERGIES:  No Known Allergies  SOCIAL HISTORY:  Social History  Substance Use Topics  . Smoking status: Former Smoker    Types: Cigarettes    Quit date: 09/03/2014  . Smokeless tobacco: Never Used  . Alcohol Use: No    FAMILY HISTORY: Family History  Problem Relation Age of Onset  . Drug abuse Mother   . Heart disease Mother   . Alcohol abuse Father   . Diabetes Paternal Aunt   . Arthritis Paternal Grandmother   . Stroke Paternal Grandfather   . Diabetes Paternal Grandfather     EXAM: BP 102/66 mmHg  Pulse 54  Temp(Src) 98.9 F (37.2 C)  Resp 16  Ht 5\' 10"  (1.778 m)  SpO2 100% CONSTITUTIONAL: Alert and oriented and  responds appropriately to questions. Well-appearing; well-nourished HEAD: Normocephalic EYES: Conjunctivae clear, PERRL ENT: normal nose; no rhinorrhea; moist mucous membranes NECK: Supple, no meningismus, no LAD  CARD: RRR; S1 and S2 appreciated; no murmurs, no clicks, no rubs, no gallops RESP: Normal chest excursion without splinting or tachypnea; breath sounds clear and equal bilaterally; no wheezes, no rhonchi, no rales, no hypoxia or respiratory distress, speaking full sentences ABD/GI: Normal bowel sounds; non-distended; soft, non-tender, no  rebound, no guarding, no peritoneal signs BACK:  The back appears normal and is non-tender to palpation, there is no CVA tenderness EXT: Normal ROM in all joints; non-tender to palpation; no edema; normal capillary refill; no cyanosis, no calf tenderness or swelling    SKIN: Normal color for age and race; warm; no rash;  right axilla with 5x4 cm area of  fluctuance  without drainage, no surrounding erythema or warmth NEURO: Moves all extremities equally, sensation to light touch intact diffusely, cranial nerves II through XII intact PSYCH: The patient's mood and manner are appropriate. Grooming and personal hygiene are appropriate.  4:04 AM INCISION AND DRAINAGE PROCEDURE NOTE: Patient identification was confirmed and verbal consent was obtained. This procedure was performed by Layla Maw Ward, DO at 4:04 AM. Site: right axilla  Sterile procedures observed Needle size: 21 Anesthetic used (type and amt): 2% lido with epi Blade size: 11 Drainage: Moderate amount of purulent Complexity: Complex Packing used quarter inch iodoform Site anesthetized, incision made over site, wound drained and explored loculations, rinsed with copious amounts of normal saline, wound packed with sterile gauze, covered with dry, sterile dressing.  Pt tolerated procedure well without complications.  Instructions for care discussed verbally and pt provided with additional written instructions for homecare and f/u.  MEDICAL DECISION MAKING: Patient here with abscess to the right axilla. Hemodynamically stable, nontoxic. Reports normal blood sugar at home. No other complaints. Has HIV but is essentially immunocompetent on last CD4 check. No sign of surrounding cellulitis. We have incise and drain this area and placed packing. I do not feel he needs to be on antibiotics. We'll have him remove the packing in 3 days. Discussed return precautions.    At this time, I do not feel there is any life-threatening condition  present. I have reviewed and discussed all results (EKG, imaging, lab, urine as appropriate), exam findings with patient. I have reviewed nursing notes and appropriate previous records.  I feel the patient is safe to be discharged home without further emergent workup. Discussed usual and customary return precautions. Patient and family (if present) verbalize understanding and are comfortable with this plan.  Patient will follow-up with their primary care provider. If they do not have a primary care provider, information for follow-up has been provided to them. All questions have been answered.   I personally performed the services described in this documentation, which was scribed in my presence. The recorded information has been reviewed and is accurate.    Layla Maw Ward, DO 08/18/15 [id number]

## 2015-08-18 NOTE — ED Notes (Signed)
The pt is c/o an abscess under his rt arm for 4 days history of the same

## 2015-08-18 NOTE — ED Notes (Signed)
Patient has abscess  noted to right underarm. Noted to the red and hard. Patient rates pain 6/10.

## 2015-08-18 NOTE — Discharge Instructions (Signed)
Please remove your packing in 3 days. If you feel like your swelling is getting worse or you having redness, warmth streaking throughout your arm, fever, please return to the hospital.  You may alternate between ibuprofen 800 mg every 8 hours as needed for pain and Tylenol 1009 g every 6 hours as needed for pain.   Abscess An abscess is an infected area that contains a collection of pus and debris.It can occur in almost any part of the body. An abscess is also known as a furuncle or boil. CAUSES  An abscess occurs when tissue gets infected. This can occur from blockage of oil or sweat glands, infection of hair follicles, or a minor injury to the skin. As the body tries to fight the infection, pus collects in the area and creates pressure under the skin. This pressure causes pain. People with weakened immune systems have difficulty fighting infections and get certain abscesses more often.  SYMPTOMS Usually an abscess develops on the skin and becomes a painful mass that is red, warm, and tender. If the abscess forms under the skin, you may feel a moveable soft area under the skin. Some abscesses break open (rupture) on their own, but most will continue to get worse without care. The infection can spread deeper into the body and eventually into the bloodstream, causing you to feel ill.  DIAGNOSIS  Your caregiver will take your medical history and perform a physical exam. A sample of fluid may also be taken from the abscess to determine what is causing your infection. TREATMENT  Your caregiver may prescribe antibiotic medicines to fight the infection. However, taking antibiotics alone usually does not cure an abscess. Your caregiver may need to make a small cut (incision) in the abscess to drain the pus. In some cases, gauze is packed into the abscess to reduce pain and to continue draining the area. HOME CARE INSTRUCTIONS   Only take over-the-counter or prescription medicines for pain, discomfort, or  fever as directed by your caregiver.  If you were prescribed antibiotics, take them as directed. Finish them even if you start to feel better.  If gauze is used, follow your caregiver's directions for changing the gauze.  To avoid spreading the infection:  Keep your draining abscess covered with a bandage.  Wash your hands well.  Do not share personal care items, towels, or whirlpools with others.  Avoid skin contact with others.  Keep your skin and clothes clean around the abscess.  Keep all follow-up appointments as directed by your caregiver. SEEK MEDICAL CARE IF:   You have increased pain, swelling, redness, fluid drainage, or bleeding.  You have muscle aches, chills, or a general ill feeling.  You have a fever. MAKE SURE YOU:   Understand these instructions.  Will watch your condition.  Will get help right away if you are not doing well or get worse.   This information is not intended to replace advice given to you by your health care provider. Make sure you discuss any questions you have with your health care provider.   Document Released: 02/03/2005 Document Revised: 10/26/2011 Document Reviewed: 07/09/2011 Elsevier Interactive Patient Education 2016 Elsevier Inc.  Incision and Drainage Incision and drainage is a procedure in which a sac-like structure (cystic structure) is opened and drained. The area to be drained usually contains material such as pus, fluid, or blood.  LET YOUR CAREGIVER KNOW ABOUT:   Allergies to medicine.  Medicines taken, including vitamins, herbs, eyedrops, over-the-counter medicines, and  creams.  Use of steroids (by mouth or creams).  Previous problems with anesthetics or numbing medicines.  History of bleeding problems or blood clots.  Previous surgery.  Other health problems, including diabetes and kidney problems.  Possibility of pregnancy, if this applies. RISKS AND  COMPLICATIONS  Pain.  Bleeding.  Scarring.  Infection. BEFORE THE PROCEDURE  You may need to have an ultrasound or other imaging tests to see how large or deep your cystic structure is. Blood tests may also be used to determine if you have an infection or how severe the infection is. You may need to have a tetanus shot. PROCEDURE  The affected area is cleaned with a cleaning fluid. The cyst area will then be numbed with a medicine (local anesthetic). A small incision will be made in the cystic structure. A syringe or catheter may be used to drain the contents of the cystic structure, or the contents may be squeezed out. The area will then be flushed with a cleansing solution. After cleansing the area, it is often gently packed with a gauze or another wound dressing. Once it is packed, it will be covered with gauze and tape or some other type of wound dressing. AFTER THE PROCEDURE   Often, you will be allowed to go home right after the procedure.  You may be given antibiotic medicine to prevent or heal an infection.  If the area was packed with gauze or some other wound dressing, you will likely need to come back in 1 to 2 days to get it removed.  The area should heal in about 14 days.   This information is not intended to replace advice given to you by your health care provider. Make sure you discuss any questions you have with your health care provider.   Document Released: 10/20/2000 Document Revised: 10/26/2011 Document Reviewed: 06/21/2011 Elsevier Interactive Patient Education [employer] Elsevier Inc.

## 2015-08-26 ENCOUNTER — Other Ambulatory Visit (HOSPITAL_COMMUNITY)
Admission: RE | Admit: 2015-08-26 | Discharge: 2015-08-26 | Disposition: A | Discharge status: Home / Self Care | Payer: Medicaid Other | Source: Ambulatory Visit | Attending: Internal Medicine | Admitting: Internal Medicine

## 2015-08-26 ENCOUNTER — Other Ambulatory Visit: Payer: Medicaid Other

## 2015-08-26 ENCOUNTER — Other Ambulatory Visit: Payer: Self-pay | Admitting: Internal Medicine

## 2015-08-26 DIAGNOSIS — Z113 Encounter for screening for infections with a predominantly sexual mode of transmission: Secondary | ICD-10-CM | POA: Diagnosis present

## 2015-08-26 DIAGNOSIS — Z21 Asymptomatic human immunodeficiency virus [HIV] infection status: Secondary | ICD-10-CM

## 2015-08-26 LAB — CBC WITH DIFFERENTIAL/PLATELET
BASOS ABS: 0 {cells}/uL (ref 0–200)
BASOS PCT: 0 %
EOS ABS: 45 {cells}/uL (ref 15–500)
Eosinophils Relative: 1 %
HEMATOCRIT: 47.8 % (ref 38.5–50.0)
HEMOGLOBIN: 15.4 g/dL (ref 13.2–17.1)
LYMPHS PCT: 39 %
Lymphs Abs: 1755 cells/uL (ref 850–3900)
MCH: 29.2 pg (ref 27.0–33.0)
MCHC: 32.2 g/dL (ref 32.0–36.0)
MCV: 90.5 fL (ref 80.0–100.0)
MONOS PCT: 7 %
MPV: 12.3 fL (ref 7.5–12.5)
Monocytes Absolute: 315 cells/uL (ref 200–950)
NEUTROS PCT: 53 %
Neutro Abs: 2385 cells/uL (ref 1500–7800)
PLATELETS: 207 10*3/uL (ref 140–400)
RBC: 5.28 MIL/uL (ref 4.20–5.80)
RDW: 14 % (ref 11.0–15.0)
WBC: 4.5 10*3/uL (ref 3.8–10.8)

## 2015-08-27 LAB — COMPREHENSIVE METABOLIC PANEL
ALBUMIN: 4.2 g/dL (ref 3.6–5.1)
ALK PHOS: 79 U/L (ref 48–230)
ALT: 29 U/L (ref 8–46)
AST: 24 U/L (ref 12–32)
BUN: 15 mg/dL (ref 7–20)
CO2: 23 mmol/L (ref 20–31)
Calcium: 9.2 mg/dL (ref 8.9–10.4)
Chloride: 100 mmol/L (ref 98–110)
Creat: 1 mg/dL (ref 0.60–1.26)
Glucose, Bld: 282 mg/dL — ABNORMAL HIGH (ref 65–99)
POTASSIUM: 4.4 mmol/L (ref 3.8–5.1)
Sodium: 137 mmol/L (ref 135–146)
TOTAL PROTEIN: 6.9 g/dL (ref 6.3–8.2)
Total Bilirubin: 0.4 mg/dL (ref 0.2–1.1)

## 2015-08-27 LAB — T-HELPER CELL (CD4) - (RCID CLINIC ONLY)
CD4 % Helper T Cell: 34 % (ref 33–55)
CD4 T Cell Abs: 720 /uL (ref 400–2700)

## 2015-08-27 LAB — HIV-1 RNA QUANT-NO REFLEX-BLD
HIV 1 RNA Quant: <20 copies/mL (ref <20)
HIV-1 RNA Quant, Log: <1.3 Log copies/mL (ref <1.30)

## 2015-08-27 LAB — URINE CYTOLOGY ANCILLARY ONLY
CHLAMYDIA, DNA PROBE: NEGATIVE
NEISSERIA GONORRHEA: NEGATIVE

## 2015-08-27 LAB — RPR

## 2015-09-23 ENCOUNTER — Ambulatory Visit: Payer: Medicaid Other | Admitting: Internal Medicine

## 2015-09-23 ENCOUNTER — Other Ambulatory Visit: Payer: Self-pay | Admitting: *Deleted

## 2015-09-23 ENCOUNTER — Telehealth: Payer: Self-pay | Admitting: *Deleted

## 2015-09-23 DIAGNOSIS — B20 Human immunodeficiency virus [HIV] disease: Secondary | ICD-10-CM

## 2015-09-23 MED ORDER — ELVITEG-COBIC-EMTRICIT-TENOFAF 150-150-200-10 MG PO TABS: 1.0000 | ORAL_TABLET | Freq: Every day | ORAL | Status: DC

## 2015-09-23 NOTE — Telephone Encounter (Signed)
Unable to contact pt to schedule a new appt.

## 2015-10-07 ENCOUNTER — Other Ambulatory Visit: Payer: Self-pay | Admitting: *Deleted

## 2015-10-07 DIAGNOSIS — B20 Human immunodeficiency virus [HIV] disease: Secondary | ICD-10-CM

## 2015-10-07 MED ORDER — ELVITEG-COBIC-EMTRICIT-TENOFAF 150-150-200-10 MG PO TABS: 1.0000 | ORAL_TABLET | Freq: Every day | ORAL | Status: DC

## 2015-10-21 ENCOUNTER — Ambulatory Visit: Payer: Medicaid Other | Admitting: Internal Medicine

## 2015-11-25 ENCOUNTER — Telehealth: Payer: Self-pay

## 2015-11-25 NOTE — Telephone Encounter (Signed)
Per Efrain SellaMichelle Edward, NP patient will need A1C at next visit.   Laurell Josephsammy K Okie Jansson, RN

## 2015-11-26 ENCOUNTER — Encounter: Payer: Self-pay | Admitting: Internal Medicine

## 2015-11-26 ENCOUNTER — Ambulatory Visit: Payer: Medicaid Other | Admitting: *Deleted

## 2015-11-26 ENCOUNTER — Ambulatory Visit (INDEPENDENT_AMBULATORY_CARE_PROVIDER_SITE_OTHER): Payer: Medicaid Other | Admitting: Internal Medicine

## 2015-11-26 VITALS — BP 131/80 | HR 67 | Temp 98.6°F | Wt 201.0 lb

## 2015-11-26 DIAGNOSIS — E109 Type 1 diabetes mellitus without complications: Secondary | ICD-10-CM | POA: Diagnosis not present

## 2015-11-26 DIAGNOSIS — F329 Major depressive disorder, single episode, unspecified: Secondary | ICD-10-CM

## 2015-11-26 DIAGNOSIS — B001 Herpesviral vesicular dermatitis: Secondary | ICD-10-CM

## 2015-11-26 DIAGNOSIS — E1065 Type 1 diabetes mellitus with hyperglycemia: Secondary | ICD-10-CM

## 2015-11-26 DIAGNOSIS — F32A Depression, unspecified: Secondary | ICD-10-CM

## 2015-11-26 DIAGNOSIS — B20 Human immunodeficiency virus [HIV] disease: Secondary | ICD-10-CM | POA: Diagnosis not present

## 2015-11-26 DIAGNOSIS — IMO0001 Reserved for inherently not codable concepts without codable children: Secondary | ICD-10-CM

## 2015-11-26 NOTE — Assessment & Plan Note (Signed)
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 His viral load is undetectable and he's had complete CD4 reconstitution back to normal. He is taking his Genvoya correctly and consistently. He will continue it and follow-up after lab work in 6 months. I did talk to him about the importance of partner selection, limiting the number partners and always using condoms.

## 2015-11-26 NOTE — BH Specialist Note (Signed)
Counselor met with Scott Baxter in the exam room today for a warm hand off.  Counselor found patient to be oriented times four with hopeful affect.  Patient shared that he was happy and in good spirits.  Patient said that he was depressed when he first found out about his HIV.  But, patient said that he has since risen above the diagnosis and realizes that he is able to move on with his life and his goals.  Counselor provided support and encouragement.  Counselor shared with patient about support group and encouraged him to attend.  Patient has a very mature and positive attitude and was encouraged to share his story with others.  Patient indicated that he would love to speak to other patients his age that have been newly diagnosed. Patient was given the necessary information regarding the support group.  Rolena Infante, MA ,LPC [facility]

## 2015-11-26 NOTE — Assessment & Plan Note (Signed)
He can continue to use Valtrex as self-directed therapy.

## 2015-11-26 NOTE — Progress Notes (Signed)
Patient Active Problem List   Diagnosis Date Noted  . HIV disease (HCC) 09/03/2014    Priority: High  . Herpes labialis 11/26/2015  . Gastroparesis 08/30/2014  . Diabetic neuropathy (HCC) 08/30/2014  . Asthma 08/30/2014  . Tobacco abuse 08/17/2014  . Uncontrolled type 1 diabetes mellitus (HCC) 09/01/2010  . Goiter, unspecified 09/01/2010  . Hypertension 09/01/2010    Patient's Medications  New Prescriptions   No medications on file  Previous Medications   ALBUTEROL (PROVENTIL HFA;VENTOLIN HFA) 108 (90 BASE) MCG/ACT INHALER    Inhale 2 puffs into the lungs every 4 (four) hours as needed for wheezing or shortness of breath.   AZITHROMYCIN (ZITHROMAX Z-PAK) 250 MG TABLET    2 po day one, then 1 daily x 4 days   ELVITEGRAVIR-COBICISTAT-EMTRICITABINE-TENOFOVIR (GENVOYA) 150-150-200-10 MG TABS TABLET    Take 1 tablet by mouth daily with breakfast.   INSULIN ASPART (NOVOLOG FLEXPEN) 100 UNIT/ML FLEXPEN    Use up to 50 units daily   INSULIN GLARGINE (LANTUS SOLOSTAR) 100 UNIT/ML SOLOSTAR PEN    Use up to 50 units daily  Modified Medications   No medications on file  Discontinued Medications   No medications on file    Subjective: Scott Baxter is in for his first visit in one year. He continues to be very busy with work and school. 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 He plans on completing school sometime next year. He has had no problems during his Genvoya except when his year-long refills ran out recently. He takes it each day around 11:30 with food. He tolerates it well. He recently developed fever blister on his lower lip. It is resolving slowly after taking Valtrex last week. His diabetes has been under reasonably good control. He has not had a recent A1c. He states that he tries to take his own food to work at [employer] and not eat fast food and sodas. He has been sexually active in the past year he  had 3 partners and all use condoms. He disclosed his HIV status to all of them. He is not in a relationship currently.  Review of Systems: Review of Systems  Constitutional: Negative for fever, chills, weight loss, malaise/fatigue and diaphoresis.  HENT: Negative for sore throat.        As noted in history of present illness  Respiratory: Negative for cough, sputum production and shortness of breath.   Cardiovascular: Negative for chest pain.  Gastrointestinal: Negative for nausea, vomiting and diarrhea.  Skin: Negative for rash.  Neurological: Negative for headaches.  Psychiatric/Behavioral: Negative for depression and substance abuse. The patient is not nervous/anxious.     Past Medical History  Diagnosis Date  . Asthma   . Diabetes mellitus   . Vision abnormalities   . Obesity   . IUGR (intrauterine growth restriction)   . Intrauterine drug exposure   . Proctitis   . HCAP (healthcare-associated pneumonia) 08/31/2014  . HIV disease (HCC) 09/03/2014  . Recurrent boils     MRSA and MSSA    Social History  Substance Use Topics  . Smoking status: Former Smoker    Types: Cigarettes    Quit date: 09/03/2014  . Smokeless tobacco: Never Used  . Alcohol Use: No    Family History  Problem Relation Age of Onset  . Drug abuse Mother   . Heart disease Mother   .  Alcohol abuse Father   . Diabetes Paternal Aunt   . Arthritis Paternal Grandmother   . Stroke Paternal Grandfather   . Diabetes Paternal Grandfather     No Known Allergies  Objective:  Filed Vitals:   11/26/15 1418  BP: 131/80  Pulse: 67  Temp: 98.6 F (37 C)  TempSrc: Oral  Weight: 201 lb (91.173 kg)   Body mass index is 28.84 kg/(m^2).  Physical Exam  Constitutional: He is oriented to person, place, and time.  He is in good spirits.  HENT:  Mouth/Throat: No oropharyngeal exudate.  His lower lip appear somewhat overall without any discrete vesicles or ulcers.  Cardiovascular: Normal rate and regular  rhythm.   No murmur heard. Pulmonary/Chest: Effort normal and breath sounds normal.  Neurological: He is alert and oriented to person, place, and time.  Skin: No rash noted.  Psychiatric: Mood and affect normal.    Lab Results Lab Results  Component Value Date   WBC 4.5 08/26/2015   HGB 15.4 08/26/2015   HCT 47.8 08/26/2015   MCV 90.5 08/26/2015   PLT 207 08/26/2015    Lab Results  Component Value Date   CREATININE 1.00 08/26/2015   BUN 15 08/26/2015   NA 137 08/26/2015   K 4.4 08/26/2015   CL 100 08/26/2015   CO2 23 08/26/2015    Lab Results  Component Value Date   ALT 29 08/26/2015   AST 24 08/26/2015   ALKPHOS 79 08/26/2015   BILITOT 0.4 08/26/2015    Lab Results  Component Value Date   CHOL 128 08/18/2014   HDL 19* 08/18/2014   LDLCALC 97 08/18/2014   TRIG 58 08/18/2014   CHOLHDL 6.7 08/18/2014   HIV 1 RNA QUANT (copies/mL)  Date Value  08/26/2015 <20  12/05/2014 78*  09/10/2014 161096*   CD4 T CELL ABS (/uL)  Date Value  08/26/2015 720  12/05/2014 470  09/03/2014 270*     Problem List Items Addressed This Visit      High   HIV disease (HCC)    His infection has come under excellent control since starting therapy last year. His viral load is undetectable and he's had complete CD4 reconstitution back to normal. He is taking his Genvoya correctly and consistently. He will continue it and follow-up after lab work in 6 months. I did talk to him about the importance of partner selection, limiting the number partners and always using condoms.      Relevant Orders   T-helper cell (CD4)- ([facility] clinic only)   HIV 1 RNA quant-no reflex-bld   CBC   Comprehensive metabolic panel   Lipid panel   RPR   Hemoglobin A1c     Unprioritized   Herpes labialis - Primary    He can continue to use Valtrex as self-directed therapy.      Uncontrolled type 1 diabetes mellitus (HCC)    He states his blood sugars have been running around 250. I will check an A1c  today.           Cliffton Asters, MD [facility] [phone] pager   [phone] cell 11/26/2015, 3:11 PM

## 2015-11-26 NOTE — Assessment & Plan Note (Signed)
He states his blood sugars have been running around 250. I will check an A1c today.

## 2015-11-27 LAB — HEMOGLOBIN A1C
Hgb A1c MFr Bld: 13.4 % — ABNORMAL HIGH (ref <5.7)
MEAN PLASMA GLUCOSE: 338 mg/dL

## 2016-02-18 ENCOUNTER — Ambulatory Visit (HOSPITAL_COMMUNITY)
Admission: EM | Admit: 2016-02-18 | Discharge: 2016-02-18 | Disposition: A | Discharge status: Home / Self Care | Payer: Medicaid Other | Attending: Internal Medicine | Admitting: Internal Medicine

## 2016-02-18 ENCOUNTER — Encounter (HOSPITAL_COMMUNITY): Payer: Self-pay | Admitting: Family Medicine

## 2016-02-18 DIAGNOSIS — L0291 Cutaneous abscess, unspecified: Secondary | ICD-10-CM | POA: Diagnosis not present

## 2016-02-18 MED ORDER — SULFAMETHOXAZOLE-TRIMETHOPRIM 800-160 MG PO TABS
1.0000 | ORAL_TABLET | Freq: Two times a day (BID) | ORAL | 0 refills | Status: AC
Start: 2016-02-18 — End: 2016-02-25

## 2016-02-18 MED ORDER — IBUPROFEN 800 MG PO TABS: 800.0000 mg | ORAL_TABLET | Freq: Three times a day (TID) | ORAL | 0 refills | Status: DC

## 2016-02-18 MED ORDER — TRAMADOL HCL 50 MG PO TABS: 50.0000 mg | ORAL_TABLET | Freq: Four times a day (QID) | ORAL | 0 refills | Status: DC | PRN

## 2016-02-18 NOTE — Discharge Instructions (Signed)
Your abscess is not ready to be drained yet. Please continue to do the wet warm compress 15-20 min each time, four times a day. Take the antibiotic as well twice a day for 7 days. For pain, you may do ibuprofen. I am giving you a prescription for tramadol ( a stronger pain medicine) to take if the ibuprofen is not enough. If your abscess becomes softer, or if it comes to a head, then it may be ready to be drain. Either us or your regular doctor can do this for you.

## 2016-02-18 NOTE — ED Triage Notes (Signed)
Pt here for abscess to right groin area. Pt has been using warm compresses. Denies drainage,

## 2016-02-18 NOTE — ED Provider Notes (Signed)
CSN: [id number]     Arrival date & time 02/18/16  1019 History   First MD Initiated Contact with Patient 02/18/16 1207     Chief Complaint  Patient presents with  . Abscess   (Consider location/radiation/quality/duration/timing/severity/associated sxs/prior Treatment) Patient is a 20 y.o male presents today for an abscess to his right groin. He reports to get abscess quite frequently at different locations. This current abscess have been present for 4 days. He have tried warm compress with no improve. He reports his past abscess would popped and go away after warm compress but warm compress has not help this time.       Past Medical History:  Diagnosis Date  . Asthma   . Diabetes mellitus   . HCAP (healthcare-associated pneumonia) 08/31/2014  . HIV disease (HCC) 09/03/2014  . Intrauterine drug exposure   . IUGR (intrauterine growth restriction)   . Obesity   . Proctitis   . Recurrent boils    MRSA and MSSA  . Vision abnormalities    Past Surgical History:  Procedure Laterality Date  . EXCISIONAL HEMORRHOIDECTOMY    . FOOT SURGERY  1999   ingrown toenail removal   Family History  Problem Relation Age of Onset  . Drug abuse Mother   . Heart disease Mother   . Alcohol abuse Father   . Diabetes Paternal Aunt   . Arthritis Paternal Grandmother   . Stroke Paternal Grandfather   . Diabetes Paternal Grandfather    Social History  Substance Use Topics  . Smoking status: Former Smoker    Types: Cigarettes    Quit date: 09/03/2014  . Smokeless tobacco: Never Used  . Alcohol use No    Review of Systems  All other systems reviewed and are negative.   Allergies  Review of patient's allergies indicates no known allergies.  Home Medications   Prior to Admission medications   Medication Sig Start Date End Date Taking? Authorizing Provider  albuterol (PROVENTIL HFA;VENTOLIN HFA) 108 (90 Base) MCG/ACT inhaler Inhale 2 puffs into the lungs every 4 (four) hours as needed for  wheezing or shortness of breath. 05/11/15   Nicole Pisciotta, PA-C  elvitegravir-cobicistat-emtricitabine-tenofovir (GENVOYA) 150-150-200-10 MG TABS tablet Take 1 tablet by mouth daily with breakfast. 10/07/15   Cliffton Asters, MD  ibuprofen (ADVIL,MOTRIN) 800 MG tablet Take 1 tablet (800 mg total) by mouth 3 (three) times daily. 02/18/16   Lucia Estelle, NP  insulin aspart (NOVOLOG FLEXPEN) 100 UNIT/ML FlexPen Use up to 50 units daily Patient taking differently: Inject 0-50 Units into the skin 3 (three) times daily with meals.  11/12/14   Dessa Phi, MD  Insulin Glargine (LANTUS SOLOSTAR) 100 UNIT/ML Solostar Pen Use up to 50 units daily Patient taking differently: Inject 25 Units into the skin at bedtime.  11/12/14   Dessa Phi, MD  sulfamethoxazole-trimethoprim (BACTRIM DS,SEPTRA DS) 800-160 MG tablet Take 1 tablet by mouth 2 (two) times daily. 02/18/16 02/25/16  Lucia Estelle, NP  traMADol (ULTRAM) 50 MG tablet Take 1 tablet (50 mg total) by mouth every 6 (six) hours as needed. 02/18/16   Lucia Estelle, NP   Meds Ordered and Administered this Visit  Medications - No data to display  BP 119/60   Pulse 70   Temp 98.6 F (37 C)   Resp 18   SpO2 100%  No data found.   Physical Exam  Constitutional: He appears well-developed and well-nourished.  HENT:  Head: Normocephalic and atraumatic.  Cardiovascular: Normal rate, regular rhythm and normal  heart sounds.   Pulmonary/Chest: Effort normal and breath sounds normal. No respiratory distress. He has no wheezes.  Abdominal: Soft.  Skin:  Has a round 2.4cm firm and indurated round lump at the right groin that is painful on palpation. No drainage. Skin is intact. 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   Nursing note and vitals reviewed.   Urgent Care Course   Clinical Course    Procedures (including critical care time)  Labs Review Labs Reviewed - No data to display  Imaging Review No results found.   MDM   1.  Abscess    Patient send home with Bactrim DS BID x 7 days and instructed to continue the warm compress 3-4 x per day, 15- 20 min each time. Rx for ibuprofen and tramadol given. Take ibuprofen first and may start tramadol if ibuprofen does not provide the adequate pain relief. [place] Controlled Substance registry reviewed and is appropriate. Return to clinic or f/u with PCP if the abscess becomes fluctuance and if it has come to a head. All questions answered. Discharge paperwork given.    Lucia EstelleFeng Melessa Cowell, NP 02/18/16 1236

## 2016-04-28 ENCOUNTER — Telehealth: Payer: Self-pay | Admitting: Internal Medicine

## 2016-04-28 NOTE — Telephone Encounter (Signed)
Gwinda PasseMichelle Edwards, NP called to let me know that she had made special [facility] Dr. Debara PickettJeff Kerr so he could help manage Joshaua's diabetes. Unfortunately [name] did not show for that appointment.

## 2016-10-20 ENCOUNTER — Telehealth: Payer: Self-pay | Admitting: *Deleted

## 2016-10-20 ENCOUNTER — Telehealth: Payer: Self-pay | Admitting: Pharmacist

## 2016-10-20 ENCOUNTER — Telehealth: Payer: Self-pay | Admitting: Internal Medicine

## 2016-10-20 NOTE — Telephone Encounter (Signed)
Called and left voicemail for him to call me back so we can get him in to see us ASAP.

## 2016-10-20 NOTE — Telephone Encounter (Signed)
I received a phone call today from Scott PasseMichelle Edwards, NP. She was seeing Scott Baxter after a long absence. Scott Baxter has not been here since July of last year. He told Scott Baxter that he has been breaking his Genvoya in half to make his supply last longer. I instructed Scott Baxter to have him quit Genvoya completely now. I will ask one of our infectious disease pharmacists to reach out to him today to schedule a visit as soon as possible for blood work and adherence counseling.

## 2016-10-20 NOTE — Telephone Encounter (Signed)
Patient's PCP Gwinda PasseMichelle Edwards NP called on behalf of the patient. He has not been to see Dr Orvan Falconerampbell in 1 year. He has been breaking his pills in half to "make them last." NP spoke with Dr Orvan Falconerampbell first, stated he will direct Pharmacy to contact the patient for labs, follow up.  NP called triage to see if A1C and Lipid panel could be added to this future bloodwork. Andree CossHowell, Chanele Douglas M, RN

## 2016-10-27 ENCOUNTER — Ambulatory Visit (INDEPENDENT_AMBULATORY_CARE_PROVIDER_SITE_OTHER): Payer: Medicaid Other | Admitting: Pharmacist Clinician (PhC)/ Clinical Pharmacy Specialist

## 2016-10-27 ENCOUNTER — Other Ambulatory Visit (HOSPITAL_COMMUNITY)
Admission: RE | Admit: 2016-10-27 | Discharge: 2016-10-27 | Disposition: A | Discharge status: Home / Self Care | Payer: Medicaid Other | Source: Ambulatory Visit | Attending: Internal Medicine | Admitting: Internal Medicine

## 2016-10-27 DIAGNOSIS — B20 Human immunodeficiency virus [HIV] disease: Secondary | ICD-10-CM

## 2016-10-27 LAB — COMPLETE METABOLIC PANEL WITH GFR
ALBUMIN: 4.1 g/dL (ref 3.6–5.1)
ALT: 10 U/L (ref 9–46)
AST: 13 U/L (ref 10–40)
Alkaline Phosphatase: 60 U/L (ref 40–115)
BILIRUBIN TOTAL: 0.6 mg/dL (ref 0.2–1.2)
BUN: 12 mg/dL (ref 7–25)
CO2: 21 mmol/L (ref 20–31)
Calcium: 8.9 mg/dL (ref 8.6–10.3)
Chloride: 103 mmol/L (ref 98–110)
Creat: 0.86 mg/dL (ref 0.60–1.35)
GFR, Est African American: >89 mL/min (ref >=60)
GLUCOSE: 311 mg/dL — AB (ref 65–99)
Potassium: 4.4 mmol/L (ref 3.5–5.3)
SODIUM: 134 mmol/L — AB (ref 135–146)
TOTAL PROTEIN: 8 g/dL (ref 6.1–8.1)

## 2016-10-27 LAB — CBC
HCT: 47.2 % (ref 38.5–50.0)
Hemoglobin: 15.3 g/dL (ref 13.2–17.1)
MCH: 28 pg (ref 27.0–33.0)
MCHC: 32.4 g/dL (ref 32.0–36.0)
MCV: 86.3 fL (ref 80.0–100.0)
MPV: 12.5 fL (ref 7.5–12.5)
PLATELETS: 176 10*3/uL (ref 140–400)
RBC: 5.47 MIL/uL (ref 4.20–5.80)
RDW: 14.6 % (ref 11.0–15.0)
WBC: 3.6 10*3/uL — ABNORMAL LOW (ref 3.8–10.8)

## 2016-10-27 LAB — LIPID PANEL
Cholesterol: 162 mg/dL (ref <200)
HDL: 47 mg/dL (ref >40)
LDL CALC: 106 mg/dL — AB (ref <100)
TRIGLYCERIDES: 46 mg/dL (ref <150)
Total CHOL/HDL Ratio: 3.4 Ratio (ref <5.0)
VLDL: 9 mg/dL (ref <30)

## 2016-10-27 NOTE — Patient Instructions (Signed)
Stop your Genvoya Come back ans see me on 7/5 at 10:30

## 2016-10-27 NOTE — Progress Notes (Signed)
HPI: Scott Baxter is a 21 y.o. male is here to see pharmacy after missing several appts with Scott Baxter.   Allergies: No Known Allergies  Vitals:    Past Medical History: Past Medical History:  Diagnosis Date  . Asthma   . Diabetes mellitus   . HCAP (healthcare-associated pneumonia) 08/31/2014  . HIV disease (HCC) 09/03/2014  . Intrauterine drug exposure   . IUGR (intrauterine growth restriction)   . Obesity   . Proctitis   . Recurrent boils    MRSA and MSSA  . Vision abnormalities     Social History: Social History   Social History  . Marital status: Single    Spouse name: N/A  . Number of children: N/A  . Years of education: N/A   Social History Main Topics  . Smoking status: Former Smoker    Types: Cigarettes    Quit date: 09/03/2014  . Smokeless tobacco: Never Used  . Alcohol use No  . Drug use: No  . Sexual activity: Yes     Comment: declined condoms   Other Topics Concern  . Not on file   Social History Narrative   Scott Baxter has custody. 12th grade at [employer] Accepted to [employer] for the fall. Wants to major in Nursing.     Previous Regimen:   Current Regimen: Genvoya  Labs: HIV 1 RNA Quant (copies/mL)  Date Value  08/26/2015 <20  12/05/2014 78 (H)  09/10/2014 929,083 (H)   CD4 T Cell Abs (/uL)  Date Value  08/26/2015 720  12/05/2014 470  09/03/2014 270 (L)   Hepatitis B Surface Ag (no units)  Date Value  09/03/2014 NEGATIVE   HCV Ab (no units)  Date Value  09/03/2014 NEGATIVE    CrCl: CrCl cannot be calculated (Patient's most recent lab result is older than the maximum 21 days allowed.).  Lipids:    Component Value Date/Time   CHOL 128 08/18/2014 1443   TRIG 58 08/18/2014 1443   HDL 19 (L) 08/18/2014 1443   CHOLHDL 6.7 08/18/2014 1443   VLDL 12 08/18/2014 1443   LDLCALC 97 08/18/2014 1443    Assessment: Scott Baxter was last seen by Dr. Orvan Falconerampbell in July of 2017. He has missed a lot of appts since then due to stress  with work and school. Luckily, got him on the phone today and told him to come in today. 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 He started halving his Genvoya about 2 months ago. When asked why, he stated that due to his work/school schedule, he didn't have time to pick it up. Therefore, he decided to halving the tablet. This could really be a disaster with resistance here. Counseled him extensively on why you should never do that because resistance could easily develop. Before that, he missed about 1 dose of Genvoya a month. Told him that mail order is always an option and he should have asked Scott Baxter. We will [facility] in the future once we figure out his new regimen.   In the past 6 months, he had about 3 sexual partners. He is a bottom person. He did claim that condoms were used 100% of the time. He said that condoms are always used since his HIV dx. He stated that he has not had any sores or genital discharge in the past year. We are going to swab all sites today and get all labs including VL with reflex. Told him  to stop his therapy completely today and f/u with me in 3 wks to see if we can craft a new regimen. If a genotype can't be done today due to a VL of <2000, I'll repeat it in 3 wks since he'll be off of therapy.   Recommendations:  Stop Genvoya HIV VL with reflex with all classes today STDs all sites All other HIV labs F/u with me in 3 wks F/u with Dr. Orvan Baxter in July   Ulyses Southward, PharmD, BCPS, AAHIVP, CPP Clinical Infectious Disease Pharmacist [facility] 10/27/2016, 11:08 AM

## 2016-10-28 LAB — RPR

## 2016-10-28 LAB — CYTOLOGY, (ORAL, ANAL, URETHRAL) ANCILLARY ONLY
CHLAMYDIA, DNA PROBE: POSITIVE — AB
Chlamydia: NEGATIVE
NEISSERIA GONORRHEA: NEGATIVE
Neisseria Gonorrhea: NEGATIVE

## 2016-10-28 LAB — URINE CYTOLOGY ANCILLARY ONLY
CHLAMYDIA, DNA PROBE: NEGATIVE
Neisseria Gonorrhea: NEGATIVE

## 2016-10-28 LAB — T-HELPER CELL (CD4) - (RCID CLINIC ONLY)
CD4 T CELL HELPER: 23 % — AB (ref 33–55)
CD4 T Cell Abs: 410 /uL (ref 400–2700)

## 2016-10-29 ENCOUNTER — Other Ambulatory Visit: Payer: Self-pay | Admitting: Internal Medicine

## 2016-10-29 ENCOUNTER — Telehealth: Payer: Self-pay | Admitting: *Deleted

## 2016-10-29 DIAGNOSIS — A749 Chlamydial infection, unspecified: Secondary | ICD-10-CM

## 2016-10-29 DIAGNOSIS — B20 Human immunodeficiency virus [HIV] disease: Secondary | ICD-10-CM

## 2016-10-29 MED ORDER — AZITHROMYCIN 500 MG PO TABS: 1000.0000 mg | ORAL_TABLET | Freq: Once | ORAL | 0 refills | Status: AC

## 2016-10-29 NOTE — Telephone Encounter (Signed)
-----   Message from Cliffton AstersJohn Campbell, MD sent at 10/29/2016  1:12 PM EDT ----- Please let Clydie BraunKaren knows that he has a chlamydia. I have sent an order for azithromycin to his pharmacy.

## 2016-10-29 NOTE — Telephone Encounter (Signed)
Called patient to advise him of test result, left generic message that per test results there was a medication waiting for pick up at pharmacy.   Andree CossHowell, Shimshon Narula M, RN

## 2016-10-29 NOTE — Progress Notes (Signed)
Scott Baxter rectal swab was positive for chlamydia. GC was negative. I will treat him with azithromycin 1000 mg.

## 2016-10-31 LAB — HIV RNA, RTPCR W/R GT (RTI, PI,INT)
HIV-1 RNA, QN PCR: 1290 copies/mL — ABNORMAL HIGH
HIV-1 RNA, QN PCR: 3.11 {Log_copies}/mL — AB

## 2016-10-31 NOTE — Telephone Encounter (Signed)
Thanks Marcelino DusterMichelle and Jonny RuizJohn

## 2016-11-06 LAB — HIV-1 GENOTYPE: HIV-1 GENOTYPE: DETECTED — AB

## 2016-11-06 LAB — RFLX HIV-1 INTEGRASE GENOTYPE

## 2016-11-11 ENCOUNTER — Telehealth: Payer: Self-pay | Admitting: Pharmacist

## 2016-11-11 ENCOUNTER — Other Ambulatory Visit: Payer: Medicaid Other

## 2016-11-11 ENCOUNTER — Ambulatory Visit (INDEPENDENT_AMBULATORY_CARE_PROVIDER_SITE_OTHER): Payer: Self-pay | Admitting: Pharmacist

## 2016-11-11 DIAGNOSIS — A749 Chlamydial infection, unspecified: Secondary | ICD-10-CM

## 2016-11-11 DIAGNOSIS — B20 Human immunodeficiency virus [HIV] disease: Secondary | ICD-10-CM

## 2016-11-11 MED ORDER — AZITHROMYCIN 500 MG PO TABS: 1000.0000 mg | ORAL_TABLET | Freq: Once | ORAL | 0 refills | Status: AC

## 2016-11-11 MED ORDER — BICTEGRAVIR-EMTRICITAB-TENOFOV 50-200-25 MG PO TABS: 1.0000 | ORAL_TABLET | Freq: Every day | ORAL | 5 refills | Status: DC

## 2016-11-11 NOTE — Telephone Encounter (Signed)
Pt's Medicaid has changed from regular Medicaid to family planning and he is not able to fill [facility] or his insulin at [facility].  Called Scott Baxter and he was unaware that this happened.  He will call his case manager today and get it fixed.  Told him to call us and let us know and then we can fill his Biktarvy.

## 2016-11-11 NOTE — Progress Notes (Signed)
HPI: Scott Baxter is a 21 y.o. male who presents to the [facility] pharmacy clinic for HIV follow-up.   Allergies: No Known Allergies  Past Medical History: Past Medical History:  Diagnosis Date  . Asthma   . Diabetes mellitus   . HCAP (healthcare-associated pneumonia) 08/31/2014  . HIV disease (HCC) 09/03/2014  . Intrauterine drug exposure   . IUGR (intrauterine growth restriction)   . Obesity   . Proctitis   . Recurrent boils    MRSA and MSSA  . Vision abnormalities     Social History: Social History   Social History  . Marital status: Single    Spouse name: N/A  . Number of children: N/A  . Years of education: N/A   Social History Main Topics  . Smoking status: Former Smoker    Types: Cigarettes    Quit date: 09/03/2014  . Smokeless tobacco: Never Used  . Alcohol use No  . Drug use: No  . Sexual activity: Yes     Comment: declined condoms   Other Topics Concern  . Not on file   Social History Narrative   Gearldine Baxter has custody. 12th grade [employer] [facility]ed [employer] for the fall. Wants to major in Nursing.     Current Regimen: Nothing right now  Labs: HIV 1 RNA Quant (copies/mL)  Date Value  08/26/2015 <20  12/05/2014 78 (H)  09/10/2014 929,083 (H)   CD4 T Cell Abs (/uL)  Date Value  10/27/2016 410  08/26/2015 720  12/05/2014 470   Hepatitis B Surface Ag (no units)  Date Value  09/03/2014 NEGATIVE   HCV Ab (no units)  Date Value  09/03/2014 NEGATIVE    CrCl: CrCl cannot be calculated (Unknown ideal weight.).  Lipids:    Component Value Date/Time   CHOL 162 10/27/2016 1141   TRIG 46 10/27/2016 1141   HDL 47 10/27/2016 1141   CHOLHDL 3.4 10/27/2016 1141   VLDL 9 10/27/2016 1141   LDLCALC 106 (H) 10/27/2016 1141    Assessment: Scott Baxter is here today to follow-up for his HIV infection.  He was previously on Genvoya and splitting tablets in half and taking them due to not being able to get an appointment and [facility] not  supplying any refills.  Luckily, he has no resistance when checked last month.  His HIV viral load was 1290 and his CD4 count was ok at 410. He has had HIV for ~2 years and usually takes his medications in the morning.  I counseled him extensively on how to take his medications and to not split them in half ever again.  Discussed other options besides Genvoya and we will start him on Biktarvy today.  He has Medicaid and [facility].  He was diagnosed with rectal chlamydia last time he was here and never took the azithromycin (message left on his machine to call us and he never got it). I sent in a new Rx to [facility] for him to pick up today. He sees Dr. Orvan Falconerampbell in a few weeks.  He can recheck labs at that time.   Plans: - Start Biktarvy PO once daily - [facility] - Azithromycin 1 gm PO x 1  - F/u with Dr. Orvan Falconerampbell 7/18 at 315pm  [name] L. Kuppelweiser, PharmD, CPP Infectious Diseases Clinical Pharmacist [facility] 11/11/2016, 3:20 PM

## 2016-11-18 ENCOUNTER — Telehealth: Payer: Self-pay | Admitting: Pharmacist

## 2016-11-18 MED FILL — BIKTARVY 50-200-25 MG TABS: 50-200-25 | 30 days supply | Qty: 30 | Fill #0

## 2016-11-18 NOTE — Telephone Encounter (Signed)
Scott Baxter was able to get his Medicaid fixed.  [facility] [place] [facility] and it went through without any issues.  It will be mailed out to Scott Baxter.

## 2016-11-22 ENCOUNTER — Telehealth: Payer: Self-pay | Admitting: *Deleted

## 2016-11-22 NOTE — Telephone Encounter (Signed)
PA COMPLETED FOR BIKTARVY, MEDICAIID

## 2016-11-24 ENCOUNTER — Ambulatory Visit: Payer: Medicaid Other | Admitting: Internal Medicine

## 2016-12-20 ENCOUNTER — Other Ambulatory Visit: Payer: Self-pay | Admitting: Pharmacist

## 2016-12-20 ENCOUNTER — Telehealth: Payer: Self-pay | Admitting: Pharmacist

## 2016-12-20 MED FILL — BIKTARVY 50-200-25 MG TABS: 50-200-25 | 30 days supply | Qty: 30 | Fill #1

## 2016-12-20 NOTE — Telephone Encounter (Signed)
[name] lost his Medicaid. I was able to get a 30 day supply of B[facility]vy from [facility].  He is coming in tomorrow to apply for ADAP.

## 2016-12-21 ENCOUNTER — Ambulatory Visit: Payer: Self-pay

## 2016-12-22 ENCOUNTER — Ambulatory Visit: Payer: Self-pay

## 2016-12-27 ENCOUNTER — Ambulatory Visit (INDEPENDENT_AMBULATORY_CARE_PROVIDER_SITE_OTHER): Payer: Self-pay | Admitting: Pharmacist Clinician (PhC)/ Clinical Pharmacy Specialist

## 2016-12-27 DIAGNOSIS — B20 Human immunodeficiency virus [HIV] disease: Secondary | ICD-10-CM

## 2016-12-27 NOTE — Progress Notes (Signed)
HPI: Scott Baxter is a 21 y.o. male who is here to see pharmacy for his ART coverage issue.   Allergies: No Known Allergies  Vitals:    Past Medical History: Past Medical History:  Diagnosis Date  . Asthma   . Diabetes mellitus   . HCAP (healthcare-associated pneumonia) 08/31/2014  . HIV disease (HCC) 09/03/2014  . Intrauterine drug exposure   . IUGR (intrauterine growth restriction)   . Obesity   . Proctitis   . Recurrent boils    MRSA and MSSA  . Vision abnormalities     Social History: Social History   Social History  . Marital status: Single    Spouse name: N/A  . Number of children: N/A  . Years of education: N/A   Social History Main Topics  . Smoking status: Former Smoker    Types: Cigarettes    Quit date: 09/03/2014  . Smokeless tobacco: Never Used  . Alcohol use No  . Drug use: No  . Sexual activity: Yes     Comment: declined condoms   Other Topics Concern  . Not on file   Social History Narrative   Scott Baxter has custody. 12th grade at [employer]. Accepted to [employer] for the fall. Wants to major in Nursing.     Previous Regimen: Genovya  Current Regimen: Biktarvy  Labs: HIV 1 RNA Quant (copies/mL)  Date Value  08/26/2015 <20  12/05/2014 78 (H)  09/10/2014 929,083 (H)   CD4 T Cell Abs (/uL)  Date Value  10/27/2016 410  08/26/2015 720  12/05/2014 470   Hepatitis B Surface Ag (no units)  Date Value  09/03/2014 NEGATIVE   HCV Ab (no units)  Date Value  09/03/2014 NEGATIVE    CrCl: CrCl cannot be calculated (Patient's most recent lab result is older than the maximum 21 days allowed.).  Lipids:    Component Value Date/Time   CHOL 162 10/27/2016 1141   TRIG 46 10/27/2016 1141   HDL 47 10/27/2016 1141   CHOLHDL 3.4 10/27/2016 1141   VLDL 9 10/27/2016 1141   LDLCALC 106 (H) 10/27/2016 1141    Assessment: Scott Baxter has now lost Medicaid because they have converted himt Family Planning Medicaid only. Obviously, this  will not cover any meds. Scott Baxter got him a 30d supply of Biktarvy, which he started it last week. Scott Baxter will submit with ADAP application today. I'll also send in the application for his Biktarvy to provide more coverage beyond the 30d, just in case that his ADAP won't be approved by then. He is tolerating the medication well without any side effects. He is still working at [employer] and in school for Agricultural engineer. I'll bring him back in Sept to do labs before scheduling him with Scott Baxter.   Recommendations:  Cont Biktarvy 1 PO qday ADAP and PAP app today F/u in Sept for labs Schedule f/u with Scott Baxter later  Scott Baxter, PharmD, BCPS, AAHIVP, CPP Clinical Infectious Disease Pharmacist [facility] 12/27/2016, 2:09 PM

## 2016-12-27 NOTE — Patient Instructions (Signed)
Follow up with [place] in Sept for labs Continue [place]

## 2016-12-30 ENCOUNTER — Encounter (HOSPITAL_COMMUNITY): Payer: Self-pay | Admitting: Emergency Medicine

## 2016-12-30 ENCOUNTER — Ambulatory Visit (HOSPITAL_COMMUNITY)
Admission: EM | Admit: 2016-12-30 | Discharge: 2016-12-30 | Disposition: A | Discharge status: Home / Self Care | Payer: Self-pay | Attending: Emergency Medicine | Admitting: Emergency Medicine

## 2016-12-30 DIAGNOSIS — A630 Anogenital (venereal) warts: Secondary | ICD-10-CM

## 2016-12-30 MED ORDER — IMIQUIMOD 5 % EX CREA: TOPICAL_CREAM | CUTANEOUS | 1 refills | Status: DC

## 2016-12-30 NOTE — ED Provider Notes (Signed)
[facility]    CSN: [id number] Arrival date & time: 12/30/16  1552     History   Chief Complaint Chief Complaint  Patient presents with  . Genital Warts    HPI Scott Baxter is a 21 y.o. male.   HPI  Scott Baxter is a 21 y.o. male presenting to UC with c/o multiple small bumps around his rectum.  He has known HIV for about 2 years. He is followed closely by the ID clinic and takes his medications as prescribed.  Pt believes the bumps are genital warts.  His ID doctor told him if prescription cream does not help he may need to see a dermatologist or surgeon.  He is wanting to try the prescription cream today.  So far he has only tried apply cider vinegar.  Denies shrinking of bumps. No bleeding or drainage. They are mildly itchy but not painful.    Past Medical History:  Diagnosis Date  . Asthma   . Diabetes mellitus   . HCAP (healthcare-associated pneumonia) 08/31/2014  . HIV disease (HCC) 09/03/2014  . Intrauterine drug exposure   . IUGR (intrauterine growth restriction)   . Obesity   . Proctitis   . Recurrent boils    MRSA and MSSA  . Vision abnormalities     Patient Active Problem List   Diagnosis Date Noted  . Chlamydia infection 10/29/2016  . Herpes labialis 11/26/2015  . HIV disease (HCC) 09/03/2014  . Gastroparesis 08/30/2014  . Diabetic neuropathy (HCC) 08/30/2014  . Asthma 08/30/2014  . Tobacco abuse 08/17/2014  . Uncontrolled type 1 diabetes mellitus (HCC) 09/01/2010  . Goiter, unspecified 09/01/2010  . Hypertension 09/01/2010    Past Surgical History:  Procedure Laterality Date  . EXCISIONAL HEMORRHOIDECTOMY    . FOOT SURGERY  1999   ingrown toenail removal       Home Medications    Prior to Admission medications   Medication Sig Start Date End Date Taking? Authorizing Provider  albuterol (PROVENTIL HFA;VENTOLIN HFA) 108 (90 Base) MCG/ACT inhaler Inhale 2 puffs into the lungs every 4 (four) hours as needed for wheezing or  shortness of breath. 05/11/15   Pisciotta, Joni Reining, PA-C  bictegravir-emtricitabine-tenofovir AF (BIKTARVY) 50-200-25 MG TABS tablet Take 1 tablet by mouth daily. 11/11/16   Cliffton Asters, MD  ibuprofen (ADVIL,MOTRIN) 800 MG tablet Take 1 tablet (800 mg total) by mouth 3 (three) times daily. Patient not taking: Reported on 10/27/2016 02/18/16   Lucia Estelle, NP  imiquimod Mathis Dad) 5 % cream Apply topically 3 (three) times a week. Prior to bedtime. Leave on skin 6-10 hours, remove with mild soap and water 12/31/16   Waylan Rocher O, PA-C  insulin aspart (NOVOLOG FLEXPEN) 100 UNIT/ML FlexPen Use up to 50 units daily Patient taking differently: Inject 0-50 Units into the skin 3 (three) times daily with meals.  11/12/14   Dessa Phi, MD  Insulin Glargine (LANTUS SOLOSTAR) 100 UNIT/ML Solostar Pen Use up to 50 units daily Patient taking differently: Inject 25 Units into the skin at bedtime.  11/12/14   Dessa Phi, MD  traMADol (ULTRAM) 50 MG tablet Take 1 tablet (50 mg total) by mouth every 6 (six) hours as needed. Patient not taking: Reported on 10/27/2016 02/18/16   Lucia Estelle, NP    Family History Family History  Problem Relation Age of Onset  . Drug abuse Mother   . Heart disease Mother   . Alcohol abuse Father   . Diabetes Paternal Aunt   .  Arthritis Paternal Grandmother   . Stroke Paternal Grandfather   . Diabetes Paternal Grandfather     Social History Social History  Substance Use Topics  . Smoking status: Former Smoker    Types: Cigarettes    Quit date: 09/03/2014  . Smokeless tobacco: Never Used  . Alcohol use No     Allergies   Patient has no known allergies.   Review of Systems Review of Systems  Constitutional: Negative for chills and fever.  Gastrointestinal: Positive for rectal pain (irritation, itching- small bumps). Negative for anal bleeding, constipation, diarrhea, nausea and vomiting.  Genitourinary: Negative for discharge, dysuria and hematuria.  Skin:  Negative for wound.     Physical Exam Triage Vital Signs ED Triage Vitals  Enc Vitals Group     BP 12/30/16 1622 121/82     Pulse Rate 12/30/16 1622 64     Resp 12/30/16 1622 16     Temp 12/30/16 1622 98.3 F (36.8 C)     Temp Source 12/30/16 1622 Oral     SpO2 12/30/16 1622 98 %     Weight 12/30/16 1621 196 lb (88.9 kg)     Height 12/30/16 1621 5\' 10"  (1.778 m)     Head Circumference --      Peak Flow --      Pain Score 12/30/16 1621 0     Pain Loc --      Pain Edu? --      Excl. in GC? --    No data found.   Updated Vital Signs BP 121/82 (BP Location: Left Arm)   Pulse 64   Temp 98.3 F (36.8 C) (Oral)   Resp 16   Ht 5\' 10"  (1.778 m)   Wt 196 lb (88.9 kg)   SpO2 98%   BMI 28.12 kg/m   Visual Acuity Right Eye Distance:   Left Eye Distance:   Bilateral Distance:    Right Eye Near:   Left Eye Near:    Bilateral Near:     Physical Exam  Constitutional: He is oriented to person, place, and time. He appears well-developed and well-nourished. No distress.  HENT:  Head: Normocephalic and atraumatic.  Eyes: EOM are normal.  Neck: Normal range of motion.  Cardiovascular: Normal rate.   Pulmonary/Chest: Effort normal.  Genitourinary:  Genitourinary Comments: Chaperoned exam. External rectal exam: multiple small (2-72mm) flesh colored pedunculated non-tender papular lesions. No bleeding or drainage.   Musculoskeletal: Normal range of motion.  Neurological: He is alert and oriented to person, place, and time.  Skin: Skin is warm and dry. He is not diaphoretic.  Psychiatric: He has a normal mood and affect. His behavior is normal.  Nursing note and vitals reviewed.    UC Treatments / Results  Labs (all labs ordered are listed, but only abnormal results are displayed) Labs Reviewed - No data to display  EKG  EKG Interpretation None       Radiology No results found.  Procedures Procedures (including critical care time)  Medications Ordered in  UC Medications - No data to display   Initial Impression / Assessment and Plan / UC Course  I have reviewed the triage vital signs and the nursing notes.  Pertinent labs & imaging results that were available during my care of the patient were reviewed by me and considered in my medical decision making (see chart for details).       Final Clinical Impressions(s) / UC Diagnoses   Final diagnoses:  Genital warts  Hx and exam c/w genital warts Advised pt the topical cream can take several months to work.  F/u with PCP in 4-6 weeks for recheck.  Home care instructions provided.  New Prescriptions Discharge Medication List as of 12/30/2016  4:44 PM    START taking these medications   Details  imiquimod (ALDARA) 5 % cream Apply topically 3 (three) times a week. Prior to bedtime. Leave on skin 6-10 hours, remove with mild soap and water, Starting Fri 12/31/2016, Normal         Controlled Substance Prescriptions [place] Controlled Substance Registry consulted? Not Applicable   Rolla Plate 12/30/16 1735

## 2016-12-30 NOTE — ED Triage Notes (Signed)
PT reports he is HIV positive. He noticed bumps around rectum. PT reports his HIV practice told him they may be genital herpes.

## 2017-01-04 ENCOUNTER — Encounter: Payer: Self-pay | Admitting: Internal Medicine

## 2017-01-27 ENCOUNTER — Ambulatory Visit (INDEPENDENT_AMBULATORY_CARE_PROVIDER_SITE_OTHER): Payer: Self-pay | Admitting: Pharmacist

## 2017-01-27 DIAGNOSIS — B20 Human immunodeficiency virus [HIV] disease: Secondary | ICD-10-CM

## 2017-01-27 MED FILL — BIKTARVY 50-200-25 MG TABS: 50-200-25 | 30 days supply | Qty: 30 | Fill #2

## 2017-01-27 NOTE — Progress Notes (Signed)
HPI: Scott Baxter is a 21 y.o. male who presents to the [facility] pharmacy clinic for follow-up of his HIV infection.   Allergies: No Known Allergies  Past Medical History: Past Medical History:  Diagnosis Date  . Asthma   . Diabetes mellitus   . HCAP (healthcare-associated pneumonia) 08/31/2014  . HIV disease (HCC) 09/03/2014  . Intrauterine drug exposure   . IUGR (intrauterine growth restriction)   . Obesity   . Proctitis   . Recurrent boils    MRSA and MSSA  . Vision abnormalities     Social History: Social History   Social History  . Marital status: Single    Spouse name: N/A  . Number of children: N/A  . Years of education: N/A   Social History Main Topics  . Smoking status: Former Smoker    Types: Cigarettes    Quit date: 09/03/2014  . Smokeless tobacco: Never Used  . Alcohol use No  . Drug use: No  . Sexual activity: Yes     Comment: declined condoms   Other Topics Concern  . Not on file   Social History Narrative   Scott Baxter has custody. 12th grade at [employer]. Accepted to [employer] for the fall. Wants to major in Nursing.     Current Regimen: Biktarvy  Labs: HIV 1 RNA Quant (copies/mL)  Date Value  08/26/2015 <20  12/05/2014 78 (H)  09/10/2014 929,083 (H)   CD4 T Cell Abs (/uL)  Date Value  10/27/2016 410  08/26/2015 720  12/05/2014 470   Hepatitis B Surface Ag (no units)  Date Value  09/03/2014 NEGATIVE   HCV Ab (no units)  Date Value  09/03/2014 NEGATIVE    CrCl: CrCl cannot be calculated (Patient's most recent lab result is older than the maximum 21 days allowed.).  Lipids:    Component Value Date/Time   CHOL 162 10/27/2016 1141   TRIG 46 10/27/2016 1141   HDL 47 10/27/2016 1141   CHOLHDL 3.4 10/27/2016 1141   VLDL 9 10/27/2016 1141   LDLCALC 106 (H) 10/27/2016 1141    Assessment: Scott Baxter is here today to follow-up for his HIV infection. He was recently switched to [facility] after splitting his Genvoya in half  due to not being able to make his appointments and not having refills. He lost his insurance last month but we were able to get him approval for a year through [employer] patient assistance program. His ADAP is still in process. He is doing well on the Biktarvy and states he has missed no doses in the last month. He takes it at 7am when he gets off of work (he works 3rd shift at [employer]). He isn't having any side effects but does have some bumps he complained to Scott Baxter about last month.  He will get a cream for this as soon as his ADAP is approved, which should be very soon. I will get labs today and schedule with Scott Baxter since he hasn't seen him since July 2017.  Plans: - Continue Biktarvy PO once daily - HIV RNA today - F/u on ADAP - F/u with Scott Baxter 10/3 at 145pm  Scott Baxter, PharmD, CPP Infectious Diseases Clinical Pharmacist [facility] 01/27/2017, 4:27 PM

## 2017-01-28 ENCOUNTER — Other Ambulatory Visit: Payer: Self-pay | Admitting: Pharmacist

## 2017-01-28 DIAGNOSIS — B20 Human immunodeficiency virus [HIV] disease: Secondary | ICD-10-CM

## 2017-01-28 DIAGNOSIS — A630 Anogenital (venereal) warts: Secondary | ICD-10-CM

## 2017-01-28 MED ORDER — IMIQUIMOD 5 % EX CREA: TOPICAL_CREAM | CUTANEOUS | 1 refills | Status: DC

## 2017-01-28 MED ORDER — BICTEGRAVIR-EMTRICITAB-TENOFOV 50-200-25 MG PO TABS: 1.0000 | ORAL_TABLET | Freq: Every day | ORAL | 5 refills | Status: DC

## 2017-01-31 ENCOUNTER — Telehealth: Payer: Self-pay | Admitting: Pharmacist

## 2017-01-31 LAB — HIV-1 RNA QUANT-NO REFLEX-BLD
HIV 1 RNA Quant: <20 copies/mL — AB
HIV-1 RNA Quant, Log: <1.3 Log copies/mL — AB

## 2017-01-31 NOTE — Telephone Encounter (Signed)
His ADAP is approved now so I will send his Biktarvy and Aldara cream to [facility]. He will see Dr. Orvan Falconer in a few weeks.

## 2017-02-09 ENCOUNTER — Telehealth: Payer: Self-pay

## 2017-02-09 ENCOUNTER — Ambulatory Visit: Payer: Self-pay | Admitting: Internal Medicine

## 2017-02-09 NOTE — Telephone Encounter (Signed)
I called [name] about his missed appt today 02/09/17 in an attempt to reschedule him, however, I was unable to reach him and left a call back number for him to reach [place].  Scott Baxter, [place]

## 2017-02-24 ENCOUNTER — Ambulatory Visit (INDEPENDENT_AMBULATORY_CARE_PROVIDER_SITE_OTHER): Payer: Self-pay | Admitting: Internal Medicine

## 2017-02-24 ENCOUNTER — Encounter: Payer: Self-pay | Admitting: Internal Medicine

## 2017-02-24 DIAGNOSIS — L309 Dermatitis, unspecified: Secondary | ICD-10-CM | POA: Insufficient documentation

## 2017-02-24 DIAGNOSIS — F330 Major depressive disorder, recurrent, mild: Secondary | ICD-10-CM

## 2017-02-24 DIAGNOSIS — A749 Chlamydial infection, unspecified: Secondary | ICD-10-CM

## 2017-02-24 DIAGNOSIS — B20 Human immunodeficiency virus [HIV] disease: Secondary | ICD-10-CM

## 2017-02-24 DIAGNOSIS — F329 Major depressive disorder, single episode, unspecified: Secondary | ICD-10-CM | POA: Insufficient documentation

## 2017-02-24 DIAGNOSIS — L308 Other specified dermatitis: Secondary | ICD-10-CM

## 2017-02-24 DIAGNOSIS — F32A Depression, unspecified: Secondary | ICD-10-CM | POA: Insufficient documentation

## 2017-02-24 HISTORY — DX: Depression, unspecified: F32.A

## 2017-02-24 NOTE — Progress Notes (Signed)
Patient Active Problem List   Diagnosis Date Noted  . HIV disease (HCC) 09/03/2014    Priority: High  . Depression 02/24/2017  . Eczema 02/24/2017  . Chlamydia infection 10/29/2016  . Herpes labialis 11/26/2015  . Gastroparesis 08/30/2014  . Diabetic neuropathy (HCC) 08/30/2014  . Asthma 08/30/2014  . Tobacco abuse 08/17/2014  . Uncontrolled type 1 diabetes mellitus (HCC) 09/01/2010  . Goiter, unspecified 09/01/2010  . Hypertension 09/01/2010    Patient's Medications  New Prescriptions   No medications on file  Previous Medications   ALBUTEROL (PROVENTIL HFA;VENTOLIN HFA) 108 (90 BASE) MCG/ACT INHALER    Inhale 2 puffs into the lungs every 4 (four) hours as needed for wheezing or shortness of breath.   BICTEGRAVIR-EMTRICITABINE-TENOFOVIR AF (BIKTARVY) 50-200-25 MG TABS TABLET    Take 1 tablet by mouth daily.   IBUPROFEN (ADVIL,MOTRIN) 800 MG TABLET    Take 1 tablet (800 mg total) by mouth 3 (three) times daily.   IMIQUIMOD (ALDARA) 5 % CREAM    Apply topically 3 (three) times a week. Prior to bedtime. Leave on skin 6-10 hours, remove with mild soap and water   INSULIN ASPART (NOVOLOG FLEXPEN) 100 UNIT/ML FLEXPEN    Use up to 50 units daily   INSULIN GLARGINE (LANTUS SOLOSTAR) 100 UNIT/ML SOLOSTAR PEN    Use up to 50 units daily   TRAMADOL (ULTRAM) 50 MG TABLET    Take 1 tablet (50 mg total) by mouth every 6 (six) hours as needed.  Modified Medications   No medications on file  Discontinued Medications   No medications on file    Subjective: Scott Baxter is in for his first visit with me in over one year. He lost his insurance recently and started to break his Genvoya and have to make the supply lasts longer. When we became aware that he was brought in immediately. His CD4 count was 410 and his viral load was up to 1290. Fortunately he has not developed any resistance mutations. He was changed to [employer]. He takes it each morning when he gets off of work at [employer].  He is having no side effects and has not missed any doses. 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 He now has [employer] insurance and hopes to go back to seeing Gwinda PasseMichelle Edwards, his primary care provider. He has had 3 male partners in the last year. He knows that one is HIV positive and they did not use condoms. Condoms were used with his other 2 partners. He has been recently treated for chlamydia and he is on a imiquimod cream for perirectal warts. The cream is helping. He has noted recently that his eczema has flared. He is out of his triamcinolone cream. When asked him about his mood he says that he does have his down days when he thinks about his HIV infection. He said that speaking with man well here has been helpful in the past.  Review of Systems: Review of Systems  Constitutional: Negative for chills, diaphoresis, fever, malaise/fatigue and weight loss.  HENT: Negative for sore throat.   Respiratory: Negative for cough, sputum production and shortness of breath.   Cardiovascular: Negative for chest pain.  Gastrointestinal: Negative for abdominal pain, diarrhea, heartburn, nausea and vomiting.  Genitourinary: Negative for  dysuria and frequency.  Musculoskeletal: Negative for joint pain and myalgias.  Skin: Positive for itching and rash.  Neurological: Negative for dizziness and headaches.  Psychiatric/Behavioral: Positive for depression. Negative for substance abuse. The patient is not nervous/anxious.     Past Medical History:  Diagnosis Date  . Asthma   . Depression 02/24/2017  . Diabetes mellitus   . HCAP (healthcare-associated pneumonia) 08/31/2014  . HIV disease (HCC) 09/03/2014  . Intrauterine drug exposure   . IUGR (intrauterine growth restriction)   . Obesity   . Proctitis   . Recurrent boils    MRSA and MSSA  .  Vision abnormalities     Social History  Substance Use Topics  . Smoking status: Former Smoker    Types: Cigarettes    Quit date: 09/03/2014  . Smokeless tobacco: Never Used  . Alcohol use No    Family History  Problem Relation Age of Onset  . Drug abuse Mother   . Heart disease Mother   . Alcohol abuse Father   . Diabetes Paternal Aunt   . Arthritis Paternal Grandmother   . Stroke Paternal Grandfather   . Diabetes Paternal Grandfather     No Known Allergies  Health Maintenance  Topic Date Due  . FOOT EXAM  10/11/2005  . OPHTHALMOLOGY EXAM  10/11/2005  . URINE MICROALBUMIN  07/22/2013  . PNEUMOCOCCAL POLYSACCHARIDE VACCINE (2) 04/09/2016  . HEMOGLOBIN A1C  05/28/2016  . TETANUS/TDAP  10/30/2016  . INFLUENZA VACCINE  12/08/2016  . HIV Screening  Completed    Objective:  There were no vitals filed for this visit. There is no height or weight on file to calculate BMI.  Physical Exam  Constitutional: He is oriented to person, place, and time.  He is in good spirits.  HENT:  Mouth/Throat: No oropharyngeal exudate.  Eyes: Conjunctivae are normal.  Cardiovascular: Normal rate and regular rhythm.   No murmur heard. Pulmonary/Chest: Effort normal and breath sounds normal.  Abdominal: Soft. He exhibits no mass. There is no tenderness.  Musculoskeletal: Normal range of motion.  Neurological: He is alert and oriented to person, place, and time.  Skin: Rash noted.  He has dark, hyperpigmented and scaly patches in both antecubital fossae and on his lower legs.  Psychiatric: Mood and affect normal.    Lab Results Lab Results  Component Value Date   WBC 3.6 (L) 10/27/2016   HGB 15.3 10/27/2016   HCT 47.2 10/27/2016   MCV 86.3 10/27/2016   PLT 176 10/27/2016    Lab Results  Component Value Date   CREATININE 0.86 10/27/2016   BUN 12 10/27/2016   NA 134 (L) 10/27/2016   K 4.4 10/27/2016   CL 103 10/27/2016   CO2 21 10/27/2016    Lab Results  Component Value  Date   ALT 10 10/27/2016   AST 13 10/27/2016   ALKPHOS 60 10/27/2016   BILITOT 0.6 10/27/2016    Lab Results  Component Value Date   CHOL 162 10/27/2016   HDL 47 10/27/2016   LDLCALC 106 (H) 10/27/2016   TRIG 46 10/27/2016   CHOLHDL 3.4 10/27/2016   Lab Results  Component Value Date   LABRPR NON REAC 10/27/2016   HIV 1 RNA Quant (copies/mL)  Date Value  01/27/2017 <20 DETECTED (A)  08/26/2015 <20  12/05/2014 78 (H)   CD4 T Cell Abs (/uL)  Date Value  10/27/2016 410  08/26/2015 720  12/05/2014 470     Problem List Items Addressed This Visit  High   HIV disease (HCC)    His HIV infection has come back under good control now that he is on a full and effective regimen and taking it consistently. I told him to always call us immediately or contact us with MyChart if he has any difficulty getting his medications. I told him to always think of his regimen as all or none to avoid the development of resistance. He will follow-up after blood work in 3 months.        Unprioritized   Chlamydia infection    He was recently treated for chlamydia and is on therapy for genital warts. He has had unprotected sex with in HIV-infected partner. I talked to him about the utmost importance of limiting the number of partners he has in the future, being very careful about who those partners are and condom use.      Depression    I had him meet with man well today.      Eczema    He will get back with his primary care provider as soon as possible for management of his eczema and diabetes.           Cliffton Asters, MD [facility] for Infectious Disease [facility] [phone] pager   [phone] cell 02/24/2017, 12:05 PM

## 2017-02-24 NOTE — Assessment & Plan Note (Signed)
I had him meet with man well today.

## 2017-02-24 NOTE — Assessment & Plan Note (Signed)
His HIV infection has come back under good control now that he is on a full and effective regimen and taking it consistently. I told him to always call us immediately or contact us with MyChart if he has any difficulty getting his medications. I told him to always think of his regimen as all or none to avoid the development of resistance. He will follow-up after blood work in 3 months.

## 2017-02-24 NOTE — Assessment & Plan Note (Signed)
He will get back with his primary care provider as soon as possible for management of his eczema and diabetes.

## 2017-02-24 NOTE — Assessment & Plan Note (Signed)
He was recently treated for chlamydia and is on therapy for genital warts. He has had unprotected sex with in HIV-infected partner. I talked to him about the utmost importance of limiting the number of partners he has in the future, being very careful about who those partners are and condom use.

## 2017-03-13 ENCOUNTER — Encounter (HOSPITAL_COMMUNITY): Payer: Self-pay

## 2017-03-13 ENCOUNTER — Emergency Department (HOSPITAL_COMMUNITY)
Admission: EM | Admit: 2017-03-13 | Discharge: 2017-03-13 | Disposition: A | Discharge status: Home / Self Care | Payer: BLUE CROSS/BLUE SHIELD | Attending: Emergency Medicine | Admitting: Emergency Medicine

## 2017-03-13 DIAGNOSIS — Z79899 Other long term (current) drug therapy: Secondary | ICD-10-CM | POA: Insufficient documentation

## 2017-03-13 DIAGNOSIS — Z87891 Personal history of nicotine dependence: Secondary | ICD-10-CM | POA: Diagnosis not present

## 2017-03-13 DIAGNOSIS — E109 Type 1 diabetes mellitus without complications: Secondary | ICD-10-CM | POA: Insufficient documentation

## 2017-03-13 DIAGNOSIS — Z794 Long term (current) use of insulin: Secondary | ICD-10-CM | POA: Insufficient documentation

## 2017-03-13 DIAGNOSIS — J45909 Unspecified asthma, uncomplicated: Secondary | ICD-10-CM | POA: Diagnosis not present

## 2017-03-13 DIAGNOSIS — L0291 Cutaneous abscess, unspecified: Secondary | ICD-10-CM

## 2017-03-13 DIAGNOSIS — L02415 Cutaneous abscess of right lower limb: Secondary | ICD-10-CM | POA: Insufficient documentation

## 2017-03-13 MED ORDER — LIDOCAINE-EPINEPHRINE (PF) 2 %-1:200000 IJ SOLN
20.0000 mL | Freq: Once | INTRAMUSCULAR | Status: DC
Start: 2017-03-13 — End: 2017-03-13
  Filled 2017-03-13: qty 20

## 2017-03-13 MED ORDER — SULFAMETHOXAZOLE-TRIMETHOPRIM 800-160 MG PO TABS: 1.0000 | ORAL_TABLET | Freq: Two times a day (BID) | ORAL | 0 refills | Status: AC

## 2017-03-13 NOTE — ED Notes (Signed)
Declined W/C at D/C and was escorted to lobby by RN. 

## 2017-03-13 NOTE — ED Triage Notes (Signed)
Patient complains of abscess with drainage to right lower leg, discomfort with same, no fver

## 2017-03-13 NOTE — ED Provider Notes (Signed)
[facility] EMERGENCY DEPARTMENT Provider Note   CSN: [id number] Arrival date & time: 03/13/17  4098     History   Chief Complaint No chief complaint on file.   HPI Scott Baxter is a 21 y.o. male.  HPI  Scott Baxter 21 year old male with a history of type 1 diabetes, HIV, hypertension, depression, recurrent abscesses, tobacco abuse who presents to the emergency department for evaluation of 2 abscesses on the right lower leg.  Patient has a history of recurrent abscesses in the past, states that he noticed two to developing abscess on the right lower leg approximately 4 days ago.  He used a hot needle and pierced both of them two days ago and expressed purulent drainage.  He has been placing warm compresses over the leg to help alleviate the pain and swelling, also used ibuprofen with some relief.  At this time he states his pain is 8/10 in severity, "throbbing" and constant in nature.  Pain is worsened with palpating over the redness on the skin.  He denies fever, nausea/vomiting, abdominal pain, numbness, weakness, wound or lesion elsewhere.  Past Medical History:  Diagnosis Date  . Asthma   . Depression 02/24/2017  . Diabetes mellitus   . HCAP (healthcare-associated pneumonia) 08/31/2014  . HIV disease (HCC) 09/03/2014  . Intrauterine drug exposure   . IUGR (intrauterine growth restriction)   . Obesity   . Proctitis   . Recurrent boils    MRSA and MSSA  . Vision abnormalities     Patient Active Problem List   Diagnosis Date Noted  . Depression 02/24/2017  . Eczema 02/24/2017  . Chlamydia infection 10/29/2016  . Herpes labialis 11/26/2015  . HIV disease (HCC) 09/03/2014  . Gastroparesis 08/30/2014  . Diabetic neuropathy (HCC) 08/30/2014  . Asthma 08/30/2014  . Tobacco abuse 08/17/2014  . Uncontrolled type 1 diabetes mellitus (HCC) 09/01/2010  . Goiter, unspecified 09/01/2010  . Hypertension 09/01/2010    Past Surgical History:  Procedure  Laterality Date  . EXCISIONAL HEMORRHOIDECTOMY    . FOOT SURGERY  1999   ingrown toenail removal       Home Medications    Prior to Admission medications   Medication Sig Start Date End Date Taking? Authorizing Provider  albuterol (PROVENTIL HFA;VENTOLIN HFA) 108 (90 Base) MCG/ACT inhaler Inhale 2 puffs into the lungs every 4 (four) hours as needed for wheezing or shortness of breath. 05/11/15   Pisciotta, Joni Reining, PA-C  bictegravir-emtricitabine-tenofovir AF (BIKTARVY) 50-200-25 MG TABS tablet Take 1 tablet by mouth daily. 01/28/17   Kuppelweiser, [name] L, RPH-CPP  ibuprofen (ADVIL,MOTRIN) 800 MG tablet Take 1 tablet (800 mg total) by mouth 3 (three) times daily. Patient not taking: Reported on 10/27/2016 02/18/16   Lucia Estelle, NP  imiquimod [place] Dad) 5 % cream Apply topically 3 (three) times a week. Prior to bedtime. Leave on skin 6-10 hours, remove with mild soap and water 01/28/17   Kuppelweiser, [name] L, RPH-CPP  insulin aspart (NOVOLOG FLEXPEN) 100 UNIT/ML FlexPen Use up to 50 units daily Patient taking differently: Inject 0-50 Units into the skin 3 (three) times daily with meals.  11/12/14   Dessa Phi, MD  Insulin Glargine (LANTUS SOLOSTAR) 100 UNIT/ML Solostar Pen Use up to 50 units daily Patient taking differently: Inject 25 Units into the skin at bedtime.  11/12/14   Dessa Phi, MD  sulfamethoxazole-trimethoprim (BACTRIM DS,SEPTRA DS) 800-160 MG tablet Take 1 tablet 2 (two) times daily for 7 days by mouth. 03/13/17 03/20/17  Shirlyn Goltz  J, PA-C  traMADol (ULTRAM) 50 MG tablet Take 1 tablet (50 mg total) by mouth every 6 (six) hours as needed. Patient not taking: Reported on 10/27/2016 02/18/16   Lucia Estelle, NP    Family History Family History  Problem Relation Age of Onset  . Drug abuse Mother   . Heart disease Mother   . Alcohol abuse Father   . Diabetes Paternal Aunt   . Arthritis Paternal Grandmother   . Stroke Paternal Grandfather   . Diabetes Paternal  Grandfather     Social History Social History   Tobacco Use  . Smoking status: Former Smoker    Types: Cigarettes    Last attempt to quit: 09/03/2014    Years since quitting: 2.5  . Smokeless tobacco: Never Used  Substance Use Topics  . Alcohol use: No    Alcohol/week: 0.0 oz  . Drug use: No     Allergies   Patient has no known allergies.   Review of Systems Review of Systems  Constitutional: Negative for chills, fatigue and fever.  Gastrointestinal: Negative for abdominal pain, nausea and vomiting.  Musculoskeletal: Positive for myalgias (right lower leg). Negative for back pain and gait problem.  Skin: Positive for color change (redness) and wound (drainiage from the right leg).  Neurological: Negative for weakness and numbness.     Physical Exam Updated Vital Signs BP (!) 158/99   Pulse 78   Temp 98.6 F (37 C) (Oral)   Resp 16   SpO2 99%   Physical Exam  Constitutional: He is oriented to person, place, and time. He appears well-developed and well-nourished. No distress.  HENT:  Head: Normocephalic and atraumatic.  Eyes: Right eye exhibits no discharge. Left eye exhibits no discharge.  Pulmonary/Chest: Effort normal. No respiratory distress.  Musculoskeletal:  Approximately 1 cm raised area of erythema noted in the anterior portion of the right lower leg which is warm and tender to the touch.  There is a small puncture mark with crusted white drainage overlying.  There is surrounding erythema and induration which is painful to the touch.  Another area of induration, warmth, and tenderness which is approximately 2 cm in diameter noted on the lateral portion of the right lower leg.  DP pulses 2+ bilaterally.  Neurological: He is alert and oriented to person, place, and time. Coordination normal.  Distal sensation to light/sharp touch intact in bilateral lower extremities.  Gait normal in coordination and balance.  Skin: Skin is warm and dry. Capillary refill takes  less than 2 seconds. He is not diaphoretic.  Psychiatric: He has a normal mood and affect. His behavior is normal.  Nursing note and vitals reviewed.    ED Treatments / Results  Labs (all labs ordered are listed, but only abnormal results are displayed) Labs Reviewed - No data to display  EKG  EKG Interpretation None       Radiology No results found.  Procedures .Marland KitchenIncision and Drainage Date/Time: 03/13/2017 12:06 PM Performed by: Kellie Shropshire, PA-C Authorized by: Kellie Shropshire, PA-C   Consent:    Consent obtained:  Verbal and emergent situation   Consent given by:  Patient   Risks discussed:  Bleeding, incomplete drainage, infection and pain Location:    Type:  Abscess   Size:  2cm   Location:  Lower extremity   Lower extremity location:  Leg   Leg location:  R lower leg Pre-procedure details:    Skin preparation:  Betadine Anesthesia (see MAR for exact  dosages):    Anesthesia method:  Local infiltration   Local anesthetic:  Lidocaine 2% WITH epi Procedure type:    Complexity:  Simple Procedure details:    Incision types:  Single straight   Scalpel blade:  11   Wound management:  Probed and deloculated and irrigated with saline   Drainage:  Purulent and bloody   Drainage amount:  Scant   Wound treatment:  Wound left open   Packing materials:  None Post-procedure details:    Patient tolerance of procedure:  Tolerated well, no immediate complications   (including critical care time)  Medications Ordered in ED Medications  lidocaine-EPINEPHrine (XYLOCAINE W/EPI) 2 %-1:200000 (PF) injection 20 mL (not administered)     Initial Impression / Assessment and Plan / ED Course  I have reviewed the triage vital signs and the nursing notes.  Pertinent labs & imaging results that were available during my care of the patient were reviewed by me and considered in my medical decision making (see chart for details).    Patient with skin abscess amenable  to incision and drainage.  Abscess was not large enough to warrant packing or drain,  wound recheck in 2 days. Encouraged home warm soaks and flushing.  Given patient has type 1 diabetes and HIV with surrounding cellulitis will d/c with antibiotic. Discussed return precautions and patient agrees and voices understanding. Patient's blood pressure was elevated in the ER today, counseled him to have this rechecked by his PCP. He agrees and voices understanding to the plan.    Final Clinical Impressions(s) / ED Diagnoses   Final diagnoses:  Abscess    New Prescriptions This SmartLink is deprecated. Use AVSMEDLIST instead to display the medication list for a patient.   Kellie ShropshireShrosbree, Emily J, PA-C 03/13/17 1214    Donnetta Hutchingook, Brian, MD 03/15/17 1536

## 2017-03-13 NOTE — Discharge Instructions (Signed)
Please fill prescription for antibiotic called Bactrim and take twice a day for the next 7 days.   Please gently soak the right leg in warm soapy water daily to encourage drainage.  Your blood pressure was elevated in the ER today, please have this rechecked at your primary care office.  Please take all of your antibiotics until finished!   You may develop abdominal discomfort or diarrhea from the antibiotic.  You may help offset this with probiotics which you can buy or get in yogurt. Do not eat  or take the probiotics until 2 hours after your antibiotic.   Return to the ER if you develop fever greater than 100.4 F, worsening redness/tenderness/swelling despite taking antibiotics or have any new or worsening symptoms.

## 2017-05-31 ENCOUNTER — Ambulatory Visit: Payer: Medicaid Other | Admitting: Internal Medicine

## 2017-06-29 ENCOUNTER — Encounter: Payer: Self-pay | Admitting: Internal Medicine

## 2017-06-29 ENCOUNTER — Ambulatory Visit: Payer: Medicaid Other | Admitting: Internal Medicine

## 2017-06-29 VITALS — BP 126/84 | HR 88 | Ht 70.0 in | Wt 225.6 lb

## 2017-06-29 DIAGNOSIS — E1065 Type 1 diabetes mellitus with hyperglycemia: Secondary | ICD-10-CM

## 2017-06-29 DIAGNOSIS — IMO0002 Reserved for concepts with insufficient information to code with codable children: Secondary | ICD-10-CM

## 2017-06-29 DIAGNOSIS — E1043 Type 1 diabetes mellitus with diabetic autonomic (poly)neuropathy: Secondary | ICD-10-CM

## 2017-06-29 DIAGNOSIS — K3184 Gastroparesis: Secondary | ICD-10-CM

## 2017-06-29 LAB — POCT GLYCOSYLATED HEMOGLOBIN (HGB A1C): HEMOGLOBIN A1C: 10

## 2017-06-29 MED ORDER — GLUCOSE BLOOD VI STRP: ORAL_STRIP | 1 refills | Status: DC

## 2017-06-29 MED ORDER — INSULIN DEGLUDEC 200 UNIT/ML ~~LOC~~ SOPN: 26.0000 [IU] | PEN_INJECTOR | Freq: Every day | SUBCUTANEOUS | 5 refills | Status: DC

## 2017-06-29 NOTE — Progress Notes (Signed)
Patient ID: Scott Baxter, male   DOB: [birth year], 22 y.o.   MRN: [id number]   HPI: LAVANCE Baxter is a 22 y.o.-year-old male, referred by his PCP, Kerin Perna, NP for management of DM1, uncontrolled, with complications (gastroparesis, PN - although it is unclear if this is not from HIV inf.). BCBS.  Patient has been diagnosed with diabetes at age 28; he started on insulin at dx.   He needs medical clearance for [place] driving licence.  Last hemoglobin A1c was: 04/13/2017: HbA1c 11.9% Lab Results  Component Value Date   HGBA1C 13.4 (H) 11/26/2015   HGBA1C 13.5 (H) 08/17/2014   HGBA1C >14.0 10/10/2013   Pt is on: - Lantus 20 units at bedtime - misses 1-2 doses a week - Novolog 5-15 units 3x a day before meals  Meter: AccuChek (?)  Pt checks his sugars 4x a day and they are: - am: 200-210 - 2h after b'fast: n/c - before lunch: 185 - 2h after lunch: n/c - before dinner: 180s - 2h after dinner: 180s - bedtime: n/c - nighttime: n/c No lows. Lowest sugar was 90s; he has hypoglycemia awareness at 90. No previous hypoglycemia admission. Does have a glucagon kit at home. Highest sugar was 300s. + previous DKA admissions 2015-2016.    Pt's meals are: - Breakfast: skips (takes maybe 5 units with coffee) - Lunch: apple (takes 5 units) - Dinner: meat + potatoes (takes up to 15 units) - Snacks: no No sodas.  - no CKD, last BUN/creatinine:  04/13/2017: Glu 337, BUN/Cr 15/0.99 Lab Results  Component Value Date   BUN 12 10/27/2016   BUN 15 08/26/2015   CREATININE 0.86 10/27/2016   CREATININE 1.00 08/26/2015   - + HL; last set of lipids: Lab Results  Component Value Date   CHOL 162 10/27/2016   HDL 47 10/27/2016   LDLCALC 106 (H) 10/27/2016   TRIG 46 10/27/2016   CHOLHDL 3.4 10/27/2016   - last eye exam was in 2017. No DR.  - resolved numbness and tingling in his feet. (? From HIV)  Last TSH: Lab Results  Component Value Date   TSH 1.817 07/22/2012   Pt has FH  of DM in father, P aunt.  Of note, he is HIV positive. He also has HTN.  ROS: Constitutional: no weight gain/loss, no fatigue, no subjective hyperthermia/hypothermia Eyes: no blurry vision, no xerophthalmia ENT: no sore throat, no nodules palpated in throat, no dysphagia/odynophagia, no hoarseness Cardiovascular: no CP/SOB/palpitations/leg swelling Respiratory: no cough/SOB Gastrointestinal: no N/V/D/C Musculoskeletal: no muscle/joint aches Skin: no rashes Neurological: no tremors/numbness/tingling/dizziness Psychiatric: no depression/anxiety  Past Medical History:  Diagnosis Date  . Asthma   . Depression 02/24/2017  . Diabetes mellitus   . HCAP (healthcare-associated pneumonia) 08/31/2014  . HIV disease ([place]) 09/03/2014  . Intrauterine drug exposure   . IUGR (intrauterine growth restriction)   . Obesity   . Proctitis   . Recurrent boils    MRSA and MSSA  . Vision abnormalities    Past Surgical History:  Procedure Laterality Date  . EXCISIONAL HEMORRHOIDECTOMY    . FOOT SURGERY  1999   ingrown toenail removal   Social History   Socioeconomic History  . Marital status: Single    Spouse name: Not on file  . Number of children: 0  Social Needs  Occupational History  . n/a  Tobacco Use  . Smoking status: Former Smoker    Types: Cigarettes    Last attempt to quit: 09/03/2014  Years since quitting: 2.8  . Smokeless tobacco: Never Used  Substance and Sexual Activity  . Alcohol use: No    Alcohol/week: 1 beer per day  . Drug use: No  . Sexual activity: Yes    Comment: declined condoms  Other Topics Concern  . Not on file  Social History Narrative   [name] has custody. 12th grade at [employer]. Accepted to [employer] for the fall. Wants to major in Nursing.    Current Outpatient Medications on File Prior to Visit  Medication Sig Dispense Refill  . albuterol (PROVENTIL HFA;VENTOLIN HFA) 108 (90 Base) MCG/ACT inhaler Inhale 2 puffs into the lungs every  4 (four) hours as needed for wheezing or shortness of breath. 1 Inhaler 0  . bictegravir-emtricitabine-tenofovir AF (BIKTARVY) 50-200-25 MG TABS tablet Take 1 tablet by mouth daily. 30 tablet 5  . ibuprofen (ADVIL,MOTRIN) 800 MG tablet Take 1 tablet (800 mg total) by mouth 3 (three) times daily. 21 tablet 0  . imiquimod (ALDARA) 5 % cream Apply topically 3 (three) times a week. Prior to bedtime. Leave on skin 6-10 hours, remove with mild soap and water 12 each 1  . insulin aspart (NOVOLOG FLEXPEN) 100 UNIT/ML FlexPen Use up to 50 units daily (Patient taking differently: Inject 0-50 Units into the skin 3 (three) times daily with meals. ) 5 pen 2  . Insulin Glargine (LANTUS SOLOSTAR) 100 UNIT/ML Solostar Pen Use up to 50 units daily (Patient taking differently: Inject 25 Units into the skin at bedtime. ) 5 pen 2  . traMADol (ULTRAM) 50 MG tablet Take 1 tablet (50 mg total) by mouth every 6 (six) hours as needed. 12 tablet 0   No current facility-administered medications on file prior to visit.    No Known Allergies Family History  Problem Relation Age of Onset  . Drug abuse Mother   . Heart disease Mother   . Alcohol abuse Father   . Diabetes Paternal Aunt   . Arthritis Paternal Grandmother   . Stroke Paternal Grandfather   . Diabetes Paternal Grandfather     PE: BP 126/84 (BP Location: Left Arm, Patient Position: Sitting, Cuff Size: Large)   Pulse 88   Ht 5' 10"  (1.778 m)   Wt 225 lb 9.6 oz (102.3 kg)   SpO2 98%   BMI 32.37 kg/m  Wt Readings from Last 3 Encounters:  06/29/17 225 lb 9.6 oz (102.3 kg)  12/30/16 196 lb (88.9 kg)  11/26/15 201 lb (91.2 kg)   Constitutional: obese, in NAD Eyes: PERRLA, EOMI, no exophthalmos ENT: moist mucous membranes, no thyromegaly, no cervical lymphadenopathy Cardiovascular: RRR, No MRG Respiratory: CTA B Gastrointestinal: abdomen soft, NT, ND, BS+ Musculoskeletal: no deformities, strength intact in all 4 Skin: moist, warm, no  rashes Neurological: no tremor with outstretched hands, DTR normal in all 4  ASSESSMENT: 1. DM1, uncontrolled, with complications - gastroparesis - PN  PLAN:  1. Patient with long-standing, uncontrolled DM1, on basal-bolus insulin therapy. Most recent HbA1c was checked in 04/13/2017: 11.9% (very high). At today's visit, HbA1c is slightly better, at 10%. - We reviewed his diabetic regimen, which is adequate, however, we discussed about changing from Lantus to [place], due to longer action, decreased incidence of low blood sugars, and the possibility of injecting it at any time of the day.  As of now, he forgets Lantus at least once a week since he has to take it at bedtime.  We discussed that if Tyler Aas is not covered, he can continue  with Lantus, but he can take it around dinnertime, to make sure that he is not forgetting it. - Regarding NovoLog, the dose that he is taking now appears to be adequate, however, the main problem is that he is not eating regular meals due to his gastroparesis.  In fact, he has 1 meal a day, at dinnertime.  Unfortunately, this is never conducive to good diabetes control.  For now, we will continue the current doses of NovoLog (he adapts the dose based on the size of the meal).  He takes this after meals, again, due to his gastroparesis. - We did discuss about the fact that I cannot feel his DMV paper until next visit, since one of the questions is whether he is compliant with the recommended regimen. - We discussed about changes to his insulin regimen, as follows:  Patient Instructions   Please stop Lantus, and start: - Tresiba 26 units daily  Please continue: - Novolog 5-15 units with meals  Please return in 3 months with your sugar log.   - Strongly advised him to start checking sugars at different times of the day - check at least 3-4 times a day, rotating checks - given sugar log and advised how to fill it and to bring it at next appt  - given foot care  handout and explained the principles  - given instructions for hypoglycemia management "15-15 rule"  - advised for yearly eye exams - He does have a glucagon kit at home, which is not expired - advised to get ketone strips - advised to always have Glu tablets with him - advised for a Med-alert bracelet mentioning "type 1 diabetes mellitus". - at next visit, will need to refer to DM education for further help with the pump: carb counting check, basal rate validation, extended bolusing, sick days rules, etc. - given instruction Re: exercising and driving in DM1 (pt instructions) - no signs of other autoimmune disorders, but will need to check a TSH at next visit - Return to clinic in 3 mo with sugar log    Philemon Kingdom, MD PhD [facility] Endocrinology

## 2017-06-29 NOTE — Patient Instructions (Addendum)
  Please stop Lantus, and start: - Tresiba 26 units daily  Please continue: - Novolog 5-15 units with meals  Please return in 3 months with your sugar log.   Basic Rules for Patients with Type I Diabetes Mellitus  1. The American Diabetes Association (ADA) recommended targets: - fasting sugar <130 - after meal sugar <180 - HbA1C <7%  2. Engage in ?150 min moderate exercise per week  3. Make sure you have ?8h of sleep every night as this helps both blood sugars and your weight.  4. Always keep a sugar log (not only record in your meter) and bring it to all appointments with us.  5. "15-15 rule" for hypoglycemia: if sugars are low, take 15 g of carbs** ("fast sugar" - e.g. 4 glucose tablets, 4 oz orange juice), wait 15 min, then check sugars again. If still <80, repeat. Continue  until your sugars >80, then eat a normal meal.   6. Teach family members and coworkers to inject glucagon. Have a glucagon set at home and one at work. They should call 911 after using the set.  7. Check sugar before driving. If <100, correct, and only start driving if sugars rise ?160100. Check sugar every hour when on a long drive.  8. Check sugar before exercising. If <100, correct, and only start exercising if sugars rise ?100. Check sugar every hour when on a long exercise routine and 1h after you finished exercising.   If >250, check urine for ketones. If you have moderate-large ketones in urine, do not start exercise. Hydrate yourself with clear liquids and correct the high sugar. Recheck sugars and ketones before attempting to exercise.  Be aware that you might need less insulin when exercising.  *intense, short, exercise bursts can increase your sugars, but  *less intense, longer (>1h), exercise routines can decrease your sugars.   9. Make sure you have a MedAlert bracelet or pendant mentioning "Type I Diabetes Mellitus". If you have a prior episode of severe hypoglycemia or hypoglycemia unawareness,  it should also mention this.  10. Please do not walk barefoot. Inspect your feet for sores/cuts and let us know if you have them.   **E.g. of "fast carbs": ? first choice (15 g):  1 tube glucose gel, GlucoPouch 15, 2 oz glucose liquid ? second choice (15-16 g):  3 or 4 glucose tablets (best taken  with water), 15 Dextrose Bits chewable ? third choice (15-20 g):   cup fruit juice,  cup regular soda, 1 cup skim milk,  1 cup sports drink ? fourth choice (15-20 g):  1 small tube [place] gel (not frosting), 2 tbsp raisins, 1 tbsp table sugar,  candy, jelly beans, gum drops - check package for carb amount   (adapted from: Juluis RainierMcCall A.L. "Insulin therapy and hypoglycemia" Endocrinol Metab Clin N Am 2012, 41: 57-87)

## 2017-07-01 ENCOUNTER — Other Ambulatory Visit: Payer: Self-pay | Admitting: *Deleted

## 2017-07-01 DIAGNOSIS — E1065 Type 1 diabetes mellitus with hyperglycemia: Principal | ICD-10-CM

## 2017-07-01 DIAGNOSIS — K3184 Gastroparesis: Principal | ICD-10-CM

## 2017-07-01 DIAGNOSIS — IMO0002 Reserved for concepts with insufficient information to code with codable children: Secondary | ICD-10-CM

## 2017-07-01 DIAGNOSIS — E1043 Type 1 diabetes mellitus with diabetic autonomic (poly)neuropathy: Secondary | ICD-10-CM

## 2017-07-01 MED ORDER — GLUCOSE BLOOD VI STRP: ORAL_STRIP | 12 refills | Status: DC

## 2017-07-01 MED ORDER — ONETOUCH VERIO FLEX SYSTEM W/DEVICE KIT: 1.0000 | PACK | Freq: Four times a day (QID) | 0 refills | Status: DC

## 2017-07-01 MED ORDER — ONETOUCH ULTRASOFT LANCETS MISC: 12 refills | Status: DC

## 2017-07-06 ENCOUNTER — Telehealth: Payer: Self-pay | Admitting: Internal Medicine

## 2017-07-06 NOTE — Telephone Encounter (Signed)
Insulin Degludec (TRESIBA FLEXTOUCH) 200 UNIT/ML SOPN   Cover My Meds is calling on status of PA for the medication listed above  Please advise  [phone] [id number]

## 2017-07-08 ENCOUNTER — Telehealth: Payer: Self-pay | Admitting: Internal Medicine

## 2017-07-08 NOTE — Telephone Encounter (Signed)
OK to try a PA

## 2017-07-08 NOTE — Telephone Encounter (Signed)
Insulin Degludec (TRESIBA FLEXTOUCH) 200 UNIT/ML SOPN    Cover my meds is calling for status of PA   [phone] REF KEY # [id number]

## 2017-07-08 NOTE — Telephone Encounter (Signed)
Please advise on below and if PA is neccesary

## 2017-07-11 NOTE — Telephone Encounter (Signed)
He has been on Lantus for a long time >> no control of his DM >> last HbA1c was 10.

## 2017-07-11 NOTE — Telephone Encounter (Signed)
Humalog in fridge with his name, testing supplies on Scott Baxter desk, and no Lantus or Levemir to give to pt

## 2017-07-11 NOTE — Telephone Encounter (Signed)
Called pt because his Medicaid is deemed inactive as of now and BCBS is deemed inactive as well. He currently has no insurance so no PA can be done at this time. He is out of Lantus, Novolog, and test strips. Left him test strips and a new meter to pick up since we do not have strips for the meter he currently has. Have a sample on Humalog but no Novolog, ok to give Humalog?

## 2017-07-11 NOTE — Telephone Encounter (Signed)
Yes, OK to give him both Humalog and Lantus or levemir if we have.

## 2017-07-11 NOTE — Telephone Encounter (Signed)
Pt has Medicaid and a PA will be denied without documented trail and failure of either Lantus or Levemir. Please advise

## 2017-07-12 ENCOUNTER — Ambulatory Visit: Payer: Medicaid Other

## 2017-07-12 ENCOUNTER — Encounter: Payer: Self-pay | Admitting: Internal Medicine

## 2017-07-12 ENCOUNTER — Ambulatory Visit (INDEPENDENT_AMBULATORY_CARE_PROVIDER_SITE_OTHER): Payer: Self-pay | Admitting: Internal Medicine

## 2017-07-12 DIAGNOSIS — B20 Human immunodeficiency virus [HIV] disease: Secondary | ICD-10-CM

## 2017-07-12 DIAGNOSIS — E1065 Type 1 diabetes mellitus with hyperglycemia: Secondary | ICD-10-CM

## 2017-07-12 DIAGNOSIS — A749 Chlamydial infection, unspecified: Secondary | ICD-10-CM

## 2017-07-12 DIAGNOSIS — IMO0002 Reserved for concepts with insufficient information to code with codable children: Secondary | ICD-10-CM

## 2017-07-12 DIAGNOSIS — K3184 Gastroparesis: Secondary | ICD-10-CM

## 2017-07-12 DIAGNOSIS — E1043 Type 1 diabetes mellitus with diabetic autonomic (poly)neuropathy: Secondary | ICD-10-CM

## 2017-07-12 NOTE — Progress Notes (Signed)
Patient Active Problem List   Diagnosis Date Noted  . HIV disease ([place]) 09/03/2014    Priority: High  . Depression 02/24/2017  . Eczema 02/24/2017  . Chlamydia infection 10/29/2016  . Herpes labialis 11/26/2015  . Gastroparesis 08/30/2014  . Diabetic neuropathy ([place]) 08/30/2014  . Asthma 08/30/2014  . Tobacco abuse 08/17/2014  . Type 1 diabetes, uncontrolled, with gastroparesis ([place]) 09/01/2010  . Goiter, unspecified 09/01/2010  . Hypertension 09/01/2010    Patient's Medications  New Prescriptions   No medications on file  Previous Medications   ALBUTEROL (PROVENTIL HFA;VENTOLIN HFA) 108 (90 BASE) MCG/ACT INHALER    Inhale 2 puffs into the lungs every 4 (four) hours as needed for wheezing or shortness of breath.   BICTEGRAVIR-EMTRICITABINE-TENOFOVIR AF (BIKTARVY) 50-200-25 MG TABS TABLET    Take 1 tablet by mouth daily.   BLOOD GLUCOSE MONITORING SUPPL (ONETOUCH VERIO FLEX SYSTEM) W/DEVICE KIT    1 Device by Does not apply route 4 (four) times daily.   GLUCOSE BLOOD (ONETOUCH VERIO) TEST STRIP    Use as instructed   IBUPROFEN (ADVIL,MOTRIN) 800 MG TABLET    Take 1 tablet (800 mg total) by mouth 3 (three) times daily.   IMIQUIMOD (ALDARA) 5 % CREAM    Apply topically 3 (three) times a week. Prior to bedtime. Leave on skin 6-10 hours, remove with mild soap and water   INSULIN ASPART (NOVOLOG FLEXPEN) 100 UNIT/ML FLEXPEN    Use up to 50 units daily   INSULIN DEGLUDEC (TRESIBA FLEXTOUCH) 200 UNIT/ML SOPN    Inject 26 Units into the skin daily.   INSULIN GLARGINE (LANTUS SOLOSTAR) 100 UNIT/ML SOLOSTAR PEN    Use up to 50 units daily   LANCETS (ONETOUCH ULTRASOFT) LANCETS    Use as instructed   TRAMADOL (ULTRAM) 50 MG TABLET    Take 1 tablet (50 mg total) by mouth every 6 (six) hours as needed.  Modified Medications   No medications on file  Discontinued Medications   No medications on file    Subjective: Scott Baxter is in for his routine HIV follow-up visit.  He has had  no problems obtaining, taking or tolerating his Biktarvy.  He takes it each morning around 7:30 AM.  He is still working at [employer].  He has worked 7 days in a row from 2 PM until 10 PM.  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  If he passes he is going to try to get a job at [employer].  He is measuring his blood sugar 3 times daily.  It is often between 200 and 225.  He is not getting any regular exercise.   Review of Systems: ROS  Past Medical History:  Diagnosis Date  . Asthma   . Depression 02/24/2017  . Diabetes mellitus   . HCAP (healthcare-associated pneumonia) 08/31/2014  . HIV disease ([place]) 09/03/2014  . Intrauterine drug exposure   . IUGR (intrauterine growth restriction)   . Obesity   . Proctitis   . Recurrent boils    MRSA and MSSA  . Vision abnormalities     Social History   Tobacco Use  . Smoking status: Current Every Day Smoker    Packs/day: 0.15    Types: Cigarettes    Last attempt to quit: 09/03/2014    Years since quitting: 2.8  . Smokeless tobacco: Never Used  Substance Use Topics  . Alcohol use: No  Alcohol/week: 0.0 oz  . Drug use: Yes    Frequency: 3.0 times per week    Types: Marijuana    Family History  Problem Relation Age of Onset  . Drug abuse Mother   . Heart disease Mother   . Alcohol abuse Father   . Diabetes Paternal Aunt   . Arthritis Paternal Grandmother   . Stroke Paternal Grandfather   . Diabetes Paternal Grandfather     No Known Allergies  Health Maintenance  Topic Date Due  . FOOT EXAM  10/11/2005  . OPHTHALMOLOGY EXAM  10/11/2005  . URINE MICROALBUMIN  07/22/2013  . PNEUMOCOCCAL POLYSACCHARIDE VACCINE (2) 04/09/2016  . TETANUS/TDAP  10/30/2016  . HEMOGLOBIN A1C  12/27/2017  . INFLUENZA VACCINE  Completed  . HIV Screening  Completed    Objective:  Vitals:   07/12/17 1050  BP: 134/84  Pulse: (!) 57  Temp: 97.9 F (36.6 C)  TempSrc: Oral  Weight: 224 lb (101.6 kg)  Height:  _0  (1.753 m)   Body mass index is 33.08 kg/m.  Physical Exam  Lab Results Lab Results  Component Value Date   WBC 4.5 07/12/2017   HGB 15.7 07/12/2017   HCT 47.1 07/12/2017   MCV 85.5 07/12/2017   PLT 186 07/12/2017    Lab Results  Component Value Date   CREATININE 1.21 07/12/2017   BUN 15 07/12/2017   NA 137 07/12/2017   K 4.1 07/12/2017   CL 107 07/12/2017   CO2 23 07/12/2017    Lab Results  Component Value Date   ALT 16 07/12/2017   AST 16 07/12/2017   ALKPHOS 60 10/27/2016   BILITOT 0.5 07/12/2017    Lab Results  Component Value Date   CHOL 138 07/12/2017   HDL 59 07/12/2017   LDLCALC 106 (H) 10/27/2016   TRIG 49 07/12/2017   CHOLHDL 2.3 07/12/2017   Lab Results  Component Value Date   LABRPR NON-REACTIVE 07/12/2017   HIV 1 RNA Quant (copies/mL)  Date Value  01/27/2017 <20 DETECTED (A)  08/26/2015 <20  12/05/2014 78 (H)   CD4 T Cell Abs (/uL)  Date Value  10/27/2016 410  08/26/2015 720  12/05/2014 470     Problem List Items Addressed This Visit      High   HIV disease ([place])    Scott Baxter adherence has improved recently.  He will continue Biktarvy and get repeat lab work today.  He will follow-up in 6 months.      Relevant Orders   T-helper cell (CD4)- ([facility] clinic only)   HIV 1 RNA quant-no reflex-bld   CBC (Completed)   Comprehensive metabolic panel (Completed)   RPR (Completed)   Lipid panel (Completed)     Unprioritized   Chlamydia infection    I am concerned that he has multiple partners.  I talked to him about the importance of limiting the number of partners that he has, choosing his partners very carefully, disclosing his status, having his partners tested and consistent and correct use of condoms.      Type 1 diabetes, uncontrolled, with gastroparesis ([facility])    His recent A1c was still quite elevated at 10.  I encouraged him to keep working on glycemic control.  I suggested trying to get regular exercise.            Michel Bickers, MD [facility] [place] [phone] pager   [phone] cell 07/13/2017, 11:00 AM

## 2017-07-13 LAB — RPR: RPR Ser Ql: NONREACTIVE

## 2017-07-13 LAB — CBC
HCT: 47.1 % (ref 38.5–50.0)
Hemoglobin: 15.7 g/dL (ref 13.2–17.1)
MCH: 28.5 pg (ref 27.0–33.0)
MCHC: 33.3 g/dL (ref 32.0–36.0)
MCV: 85.5 fL (ref 80.0–100.0)
MPV: 12.7 fL — ABNORMAL HIGH (ref 7.5–12.5)
PLATELETS: 186 10*3/uL (ref 140–400)
RBC: 5.51 10*6/uL (ref 4.20–5.80)
RDW: 13.7 % (ref 11.0–15.0)
WBC: 4.5 10*3/uL (ref 3.8–10.8)

## 2017-07-13 LAB — LIPID PANEL
CHOLESTEROL: 138 mg/dL (ref <200)
HDL: 59 mg/dL (ref >40)
LDL Cholesterol (Calc): 65 mg/dL (calc)
NON-HDL CHOLESTEROL (CALC): 79 mg/dL (ref <130)
TRIGLYCERIDES: 49 mg/dL (ref <150)
Total CHOL/HDL Ratio: 2.3 (calc) (ref <5.0)

## 2017-07-13 LAB — COMPREHENSIVE METABOLIC PANEL
AG RATIO: 1.5 (calc) (ref 1.0–2.5)
ALKALINE PHOSPHATASE (APISO): 59 U/L (ref 40–115)
ALT: 16 U/L (ref 9–46)
AST: 16 U/L (ref 10–40)
Albumin: 4.5 g/dL (ref 3.6–5.1)
BUN: 15 mg/dL (ref 7–25)
CO2: 23 mmol/L (ref 20–32)
Calcium: 9.8 mg/dL (ref 8.6–10.3)
Chloride: 107 mmol/L (ref 98–110)
Creat: 1.21 mg/dL (ref 0.60–1.35)
GLUCOSE: 186 mg/dL — AB (ref 65–99)
Globulin: 3 g/dL (calc) (ref 1.9–3.7)
Potassium: 4.1 mmol/L (ref 3.5–5.3)
Sodium: 137 mmol/L (ref 135–146)
Total Bilirubin: 0.5 mg/dL (ref 0.2–1.2)
Total Protein: 7.5 g/dL (ref 6.1–8.1)

## 2017-07-13 LAB — T-HELPER CELL (CD4) - (RCID CLINIC ONLY)
CD4 T CELL HELPER: 35 % (ref 33–55)
CD4 T Cell Abs: 720 /uL (ref 400–2700)

## 2017-07-13 NOTE — Assessment & Plan Note (Signed)
Quenton's adherence has improved recently.  He will continue Biktarvy and get repeat lab work today.  He will follow-up in 6 months.

## 2017-07-13 NOTE — Assessment & Plan Note (Signed)
His recent A1c was still quite elevated at 10.  I encouraged him to keep working on glycemic control.  I suggested trying to get regular exercise.

## 2017-07-13 NOTE — Assessment & Plan Note (Signed)
I am concerned that he has multiple partners.  I talked to him about the importance of limiting the number of partners that he has, choosing his partners very carefully, disclosing his status, having his partners tested and consistent and correct use of condoms.

## 2017-07-14 LAB — HIV-1 RNA QUANT-NO REFLEX-BLD
HIV 1 RNA Quant: <20 copies/mL — AB
HIV-1 RNA QUANT, LOG: DETECTED {Log_copies}/mL — AB

## 2017-07-25 ENCOUNTER — Encounter: Payer: Self-pay | Admitting: Internal Medicine

## 2017-09-01 ENCOUNTER — Other Ambulatory Visit: Payer: Self-pay | Admitting: Pharmacist

## 2017-09-01 DIAGNOSIS — B20 Human immunodeficiency virus [HIV] disease: Secondary | ICD-10-CM

## 2017-09-01 MED ORDER — BICTEGRAVIR-EMTRICITAB-TENOFOV 50-200-25 MG PO TABS
1.0000 | ORAL_TABLET | Freq: Every day | ORAL | 11 refills | Status: DC
Start: 2017-09-01 — End: 2018-09-11

## 2017-09-28 ENCOUNTER — Ambulatory Visit: Payer: Medicaid Other | Admitting: Internal Medicine

## 2017-09-28 ENCOUNTER — Encounter: Payer: Self-pay | Admitting: Internal Medicine

## 2017-09-28 VITALS — BP 134/92 | HR 98 | Ht 69.0 in | Wt 220.2 lb

## 2017-09-28 DIAGNOSIS — K3184 Gastroparesis: Secondary | ICD-10-CM

## 2017-09-28 DIAGNOSIS — E1043 Type 1 diabetes mellitus with diabetic autonomic (poly)neuropathy: Secondary | ICD-10-CM

## 2017-09-28 DIAGNOSIS — IMO0002 Reserved for concepts with insufficient information to code with codable children: Secondary | ICD-10-CM

## 2017-09-28 DIAGNOSIS — E1065 Type 1 diabetes mellitus with hyperglycemia: Secondary | ICD-10-CM

## 2017-09-28 LAB — POCT GLYCOSYLATED HEMOGLOBIN (HGB A1C): Hemoglobin A1C: 7.5 % — AB (ref 4.0–5.6)

## 2017-09-28 MED ORDER — INSULIN DEGLUDEC 200 UNIT/ML ~~LOC~~ SOPN: 26.0000 [IU] | PEN_INJECTOR | Freq: Every day | SUBCUTANEOUS | 5 refills | Status: DC

## 2017-09-28 MED ORDER — INSULIN ASPART 100 UNIT/ML FLEXPEN
PEN_INJECTOR | SUBCUTANEOUS | 3 refills | Status: DC
Start: 2017-09-28 — End: 2018-07-07

## 2017-09-28 MED ORDER — GLUCOSE BLOOD VI STRP
ORAL_STRIP | 5 refills | Status: DC
Start: 2017-09-28 — End: 2019-05-16

## 2017-09-28 NOTE — Progress Notes (Signed)
Patient ID: Scott Baxter, male   DOB: 09/10/1995, 22 y.o.   MRN: [id number]   HPI: Scott Baxter is a 22 y.o.-year-old male, returning for follow-up for DM1, diagnosed at age 22, uncontrolled, with complications (gastroparesis, PN - although it is unclear if this is not from HIV inf.).  Last visit 3 months ago. [employer].  He needs medical clearance for  driving license.  Since last visit, he started to take his insulin consistently, eat breakfast, and also exercise so his sugars are much better!  He also lost 5 pounds.  Last hemoglobin A1c was: Lab Results  Component Value Date   HGBA1C 10.0 06/29/2017   HGBA1C 13.4 (H) 11/26/2015   HGBA1C 13.5 (H) 08/17/2014  04/13/2017: HbA1c 11.9%  Pt is on: - Lantus 20 units at bedtime (was missing 1-2 doses a week) >> changed to [place] 26 units daily at last visit, however, he could not afford it so we gave him samples of Lantus (he was between insurance plans) - Novolog 5-15 units 3x a day before meals  Meter: Accu-Chek  Pt checks his sugars 4X a day: - am: 200-210 >> 90-130 - 2h after b'fast: n/c - before lunch: 185 >> 110-117 - 2h after lunch: n/c - before dinner: 180s >> 110-117 - 2h after dinner: 180s >> 110-120 - bedtime: n/c - nighttime: n/c No lows.  Lowest sugar was 90s >> 84; he has hypoglycemia awareness in the 80s.  No previous hypoglycemia admissions.  He does have a glucagon kit at home.   Highest sugar was 300s >> 156.  He does have previous DKA admissions between 2015 and 16.  Pt's meals are: - Breakfast: skips (takes maybe 5 units with coffee) >> PB sandwich - Lunch: apple (takes 5 units) - Dinner: meat + potatoes (takes up to 15 units) - Snacks: no No sodas.  -No CKD, last BUN/creatinine:  04/13/2017: Glu 337, BUN/Cr 15/0.99 Lab Results  Component Value Date   BUN 15 07/12/2017   BUN 12 10/27/2016   CREATININE 1.21 07/12/2017   CREATININE 0.86 10/27/2016  He is not on an ACE inhibitor/ARB -no HL; last set  of lipids: Lab Results  Component Value Date   CHOL 138 07/12/2017   HDL 59 07/12/2017   LDLCALC 65 07/12/2017   TRIG 49 07/12/2017   CHOLHDL 2.3 07/12/2017  He is not on a statin. - last eye exam was in 2017: No DR - He had numbness and tingling in his feet, now resolved. (? From HIV)  Latest TSH was normal 5 years ago: Lab Results  Component Value Date   TSH 1.817 07/22/2012   Pt has FH of DM in father, P aunt.  Of note, he is HIV positive.  He also has hypertension.  ROS: Constitutional: no weight gain/no weight loss, no fatigue, no subjective hyperthermia, no subjective hypothermia Eyes: no blurry vision, no xerophthalmia ENT: no sore throat, no nodules palpated in throat, no dysphagia, no odynophagia, no hoarseness Cardiovascular: no CP/no SOB/no palpitations/no leg swelling Respiratory: no cough/no SOB/no wheezing Gastrointestinal: no N/no V/no D/no C/no acid reflux Musculoskeletal: no muscle aches/no joint aches Skin: no rashes, no hair loss Neurological: no tremors/no numbness/no tingling/no dizziness  I reviewed pt's medications, allergies, PMH, social hx, family hx, and changes were documented in the history of present illness. Otherwise, unchanged from my initial visit note.  Past Medical History:  Diagnosis Date  . Asthma   . Depression 02/24/2017  . Diabetes mellitus   . HCAP (  healthcare-associated pneumonia) 08/31/2014  . HIV disease ([place]) 09/03/2014  . Intrauterine drug exposure   . IUGR (intrauterine growth restriction)   . Obesity   . Proctitis   . Recurrent boils    MRSA and MSSA  . Vision abnormalities    Past Surgical History:  Procedure Laterality Date  . EXCISIONAL HEMORRHOIDECTOMY    . FOOT SURGERY  1999   ingrown toenail removal   Social History   Socioeconomic History  . Marital status: Single    Spouse name: Not on file  . Number of children: 0  Social Needs  Occupational History  . n/a  Tobacco Use  . Smoking status: Former  Smoker    Types: Cigarettes    Last attempt to quit: 09/03/2014    Years since quitting: 2.8  . Smokeless tobacco: Never Used  Substance and Sexual Activity  . Alcohol use: No    Alcohol/week: 1 beer per day  . Drug use: No  . Sexual activity: Yes    Comment: declined condoms  Other Topics Concern  . Not on file  Social History Narrative   Scott Baxter has custody. 12th grade at [employer]. Accepted to [employer] for the fall. Wants to major in Nursing.    Current Outpatient Medications on File Prior to Visit  Medication Sig Dispense Refill  . albuterol (PROVENTIL HFA;VENTOLIN HFA) 108 (90 Base) MCG/ACT inhaler Inhale 2 puffs into the lungs every 4 (four) hours as needed for wheezing or shortness of breath. 1 Inhaler 0  . bictegravir-emtricitabine-tenofovir AF (BIKTARVY) 50-200-25 MG TABS tablet Take 1 tablet by mouth daily. 30 tablet 11  . Blood Glucose Monitoring Suppl (ONETOUCH VERIO FLEX SYSTEM) w/Device KIT 1 Device by Does not apply route 4 (four) times daily. 1 kit 0  . glucose blood (ONETOUCH VERIO) test strip Use as instructed 200 each 12  . ibuprofen (ADVIL,MOTRIN) 800 MG tablet Take 1 tablet (800 mg total) by mouth 3 (three) times daily. 21 tablet 0  . imiquimod (ALDARA) 5 % cream Apply topically 3 (three) times a week. Prior to bedtime. Leave on skin 6-10 hours, remove with mild soap and water (Patient not taking: Reported on 07/12/2017) 12 each 1  . insulin aspart (NOVOLOG FLEXPEN) 100 UNIT/ML FlexPen Use up to 50 units daily (Patient taking differently: Inject 0-50 Units into the skin 3 (three) times daily with meals. ) 5 pen 2  . Insulin Degludec (TRESIBA FLEXTOUCH) 200 UNIT/ML SOPN Inject 26 Units into the skin daily. (Patient not taking: Reported on 07/12/2017) 6 pen 5  . Insulin Glargine (LANTUS SOLOSTAR) 100 UNIT/ML Solostar Pen Use up to 50 units daily (Patient taking differently: Inject 25 Units into the skin at bedtime. ) 5 pen 2  . Lancets (ONETOUCH ULTRASOFT)  lancets Use as instructed 200 each 12  . traMADol (ULTRAM) 50 MG tablet Take 1 tablet (50 mg total) by mouth every 6 (six) hours as needed. (Patient not taking: Reported on 07/12/2017) 12 tablet 0   No current facility-administered medications on file prior to visit.    No Known Allergies Family History  Problem Relation Age of Onset  . Drug abuse Mother   . Heart disease Mother   . Alcohol abuse Father   . Diabetes Paternal Aunt   . Arthritis Paternal Grandmother   . Stroke Paternal Grandfather   . Diabetes Paternal Grandfather    PE: BP (!) 134/92   Pulse 98   Ht _0  (1.753 m)   Wt 220 lb 3.2  oz (99.9 kg)   SpO2 98%   BMI 32.52 kg/m  Wt Readings from Last 3 Encounters:  09/28/17 220 lb 3.2 oz (99.9 kg)  07/12/17 224 lb (101.6 kg)  06/29/17 225 lb 9.6 oz (102.3 kg)   Constitutional: Obese, in NAD Eyes: PERRLA, EOMI, no exophthalmos ENT: moist mucous membranes, no thyromegaly, no cervical lymphadenopathy Cardiovascular: tachycardia, RR, No MRG Respiratory: CTA B Gastrointestinal: abdomen soft, NT, ND, BS+ Musculoskeletal: no deformities, strength intact in all 4 Skin: moist, warm, no rashes Neurological: no tremor with outstretched hands, DTR normal in all 4  ASSESSMENT: 1. DM1, uncontrolled, with complications - gastroparesis - PN  2.  Obesity  PLAN:  1. Patient with long-standing, uncontrolled, type 1 diabetes, on basal-bolus insulin therapy.  His HbA1c levels decreased from >14% gradually to 10% at last visit 3 months ago.  At last visit, we changed from Lantus to [place] due to longer action, decreased incidence of low blood sugars and the possibility of injecting it at any time of the day, as he was missing some Lantus doses.  However, since then, he was [employer] and we had to give him samples of Lantus and NovoLog. - At last visit, he was not eating regular meals due to gastroparesis and was having only one meal a day, supper.  Therefore, we  discussed about splitting his meals throughout the day and covering these with NovoLog.  I advised him to take the NovoLog at the start of or right after the meal, rather than 15 minutes before due to gastroparesis. - at this visit, sugars are much better especially after starting to exercise (his home now, but plans to start going to the gym), starting to eat breakfast and taking his insulin consistently.  He has no lows and no significant hyperglycemic spikes. - I congratulated him for this significant improvement and strongly advised him to continue.   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  Please return in 4 months with your sugar log.   -- today, HbA1c is 7.5% (MUCH better) - continue checking sugars at different times of the day - check 4x a day, rotating checks - advised for yearly eye exams >> he is not up-to-date, again advised to schedule  - will need to check his thyroid tests at next visit - Return to clinic in 4 mo with sugar log   2.  Obesity - Lost 5 pounds since last visit despite increase in insulin dose - He is now eating breakfast, which is an improvement from last time when he was only eating 1 meal a day, at night - He also started to exercise, and will soon start going to the gym - I congratulated him and advised him to continue   Philemon Kingdom, MD PhD [facility] Endocrinology

## 2017-09-28 NOTE — Patient Instructions (Addendum)
Please switch from Lantus to:  - [place] 26 units daily  Continue: - Novolog 5-15 units with meals  KEEP UP THE GOOD WORK!  Please return in 4 months with your sugar log.

## 2017-10-05 ENCOUNTER — Telehealth: Payer: Self-pay | Admitting: *Deleted

## 2017-10-05 NOTE — Telephone Encounter (Signed)
Patient's DMV forms are completed and ready for p/u. He still needs to complete some parts on the form. I called pt and left him a detailed message advising him of this. Forms are upfront in brown folder.

## 2018-01-14 ENCOUNTER — Encounter (HOSPITAL_COMMUNITY): Payer: Self-pay | Admitting: Emergency Medicine

## 2018-01-14 ENCOUNTER — Other Ambulatory Visit: Payer: Self-pay

## 2018-01-14 ENCOUNTER — Emergency Department (HOSPITAL_COMMUNITY)
Admission: EM | Admit: 2018-01-14 | Discharge: 2018-01-14 | Disposition: A | Discharge status: Home / Self Care | Payer: BLUE CROSS/BLUE SHIELD | Attending: Emergency Medicine | Admitting: Emergency Medicine

## 2018-01-14 ENCOUNTER — Emergency Department (HOSPITAL_COMMUNITY): Payer: BLUE CROSS/BLUE SHIELD

## 2018-01-14 DIAGNOSIS — J45909 Unspecified asthma, uncomplicated: Secondary | ICD-10-CM | POA: Insufficient documentation

## 2018-01-14 DIAGNOSIS — R0602 Shortness of breath: Secondary | ICD-10-CM | POA: Diagnosis present

## 2018-01-14 DIAGNOSIS — Z21 Asymptomatic human immunodeficiency virus [HIV] infection status: Secondary | ICD-10-CM | POA: Insufficient documentation

## 2018-01-14 DIAGNOSIS — F17210 Nicotine dependence, cigarettes, uncomplicated: Secondary | ICD-10-CM | POA: Diagnosis not present

## 2018-01-14 DIAGNOSIS — E1040 Type 1 diabetes mellitus with diabetic neuropathy, unspecified: Secondary | ICD-10-CM | POA: Diagnosis not present

## 2018-01-14 DIAGNOSIS — I10 Essential (primary) hypertension: Secondary | ICD-10-CM | POA: Diagnosis not present

## 2018-01-14 DIAGNOSIS — J069 Acute upper respiratory infection, unspecified: Secondary | ICD-10-CM

## 2018-01-14 LAB — URINALYSIS, ROUTINE W REFLEX MICROSCOPIC
BACTERIA UA: NONE SEEN
Bilirubin Urine: NEGATIVE
Hgb urine dipstick: NEGATIVE
Ketones, ur: 5 mg/dL — AB
Leukocytes, UA: NEGATIVE
Nitrite: NEGATIVE
PH: 7 (ref 5.0–8.0)
Protein, ur: NEGATIVE mg/dL
Specific Gravity, Urine: 1.006 (ref 1.005–1.030)

## 2018-01-14 LAB — CBC
HEMATOCRIT: 50.1 % (ref 39.0–52.0)
Hemoglobin: 16.4 g/dL (ref 13.0–17.0)
MCH: 29 pg (ref 26.0–34.0)
MCHC: 32.7 g/dL (ref 30.0–36.0)
MCV: 88.7 fL (ref 78.0–100.0)
Platelets: 225 10*3/uL (ref 150–400)
RBC: 5.65 MIL/uL (ref 4.22–5.81)
RDW: 13.5 % (ref 11.5–15.5)
WBC: 13.2 10*3/uL — ABNORMAL HIGH (ref 4.0–10.5)

## 2018-01-14 LAB — COMPREHENSIVE METABOLIC PANEL
ALBUMIN: 4.6 g/dL (ref 3.5–5.0)
ALT: 17 U/L (ref 0–44)
AST: 20 U/L (ref 15–41)
Alkaline Phosphatase: 76 U/L (ref 38–126)
Anion gap: 11 (ref 5–15)
BUN: 6 mg/dL (ref 6–20)
CO2: 22 mmol/L (ref 22–32)
CREATININE: 0.96 mg/dL (ref 0.61–1.24)
Calcium: 9.7 mg/dL (ref 8.9–10.3)
Chloride: 101 mmol/L (ref 98–111)
GFR calc Af Amer: >60 mL/min (ref >60)
GLUCOSE: 210 mg/dL — AB (ref 70–99)
POTASSIUM: 3.9 mmol/L (ref 3.5–5.1)
SODIUM: 134 mmol/L — AB (ref 135–145)
TOTAL PROTEIN: 8.2 g/dL — AB (ref 6.5–8.1)
Total Bilirubin: 1.6 mg/dL — ABNORMAL HIGH (ref 0.3–1.2)

## 2018-01-14 LAB — LIPASE, BLOOD: Lipase: 23 U/L (ref 11–51)

## 2018-01-14 MED ORDER — ALBUTEROL SULFATE HFA 108 (90 BASE) MCG/ACT IN AERS
2.0000 | INHALATION_SPRAY | Freq: Once | RESPIRATORY_TRACT | Status: AC
  Administered 2018-01-14: 2 via RESPIRATORY_TRACT
  Filled 2018-01-14: qty 6.7

## 2018-01-14 MED ORDER — IPRATROPIUM-ALBUTEROL 0.5-2.5 (3) MG/3ML IN SOLN
3.0000 mL | Freq: Once | RESPIRATORY_TRACT | Status: AC
  Administered 2018-01-14: 3 mL via RESPIRATORY_TRACT
  Filled 2018-01-14: qty 3

## 2018-01-14 NOTE — ED Triage Notes (Signed)
Pt. Stated, I started having SOB and irts different from my asthma. I have nausea, chills, and runny nose

## 2018-01-14 NOTE — ED Provider Notes (Signed)
[facility] EMERGENCY DEPARTMENT Provider Note   CSN: [id number] Arrival date & time: 01/14/18  0736     History   Chief Complaint Chief Complaint  Patient presents with  . Shortness of Breath  . Chills  . Nasal Congestion  . Nausea    HPI Scott Baxter is a 22 y.o. male.  The history is provided by the patient. No language interpreter was used.  Shortness of Breath    Scott Baxter is a 22 y.o. male who presents to the Emergency Department complaining of sob. A history of HIV, type I diabetes, and asthma. He presents to the emergency department for chills, shortness of breath, cough, nasal congestion starting yesterday. He has a cough productive of clear sputum. He has chest pain with coughing. He endorses runny nose, chills malaise. He denies any fevers. He does have nausea with mild abdominal discomfort. He has an itchy throat. No known sick contacts. He is compliant with his medications. He states this feels different than his typical asthma. Past Medical History:  Diagnosis Date  . Asthma   . Depression 02/24/2017  . Diabetes mellitus   . HCAP (healthcare-associated pneumonia) 08/31/2014  . HIV disease ([place]) 09/03/2014  . Intrauterine drug exposure   . IUGR (intrauterine growth restriction)   . Obesity   . Proctitis   . Recurrent boils    MRSA and MSSA  . Vision abnormalities     Patient Active Problem List   Diagnosis Date Noted  . Depression 02/24/2017  . Eczema 02/24/2017  . Chlamydia infection 10/29/2016  . Herpes labialis 11/26/2015  . HIV disease ([place]) 09/03/2014  . Gastroparesis 08/30/2014  . Diabetic neuropathy ([place]) 08/30/2014  . Asthma 08/30/2014  . Tobacco abuse 08/17/2014  . Type 1 diabetes, uncontrolled, with gastroparesis ([place]) 09/01/2010  . Goiter, unspecified 09/01/2010  . Hypertension 09/01/2010    Past Surgical History:  Procedure Laterality Date  . EXCISIONAL HEMORRHOIDECTOMY    . FOOT SURGERY  1999   ingrown  toenail removal        Home Medications    Prior to Admission medications   Medication Sig Start Date End Date Taking? Authorizing Provider  albuterol (PROVENTIL HFA;VENTOLIN HFA) 108 (90 Base) MCG/ACT inhaler Inhale 2 puffs into the lungs every 4 (four) hours as needed for wheezing or shortness of breath. 05/11/15   Pisciotta, Elmyra Ricks, PA-C  bictegravir-emtricitabine-tenofovir AF (BIKTARVY) 50-200-25 MG TABS tablet Take 1 tablet by mouth daily. 09/01/17   Kuppelweiser, [name] L, RPH-CPP  Blood Glucose Monitoring Suppl (ONETOUCH VERIO FLEX SYSTEM) w/Device KIT 1 Device by Does not apply route 4 (four) times daily. 07/01/17   Philemon Kingdom, MD  glucose blood (ONETOUCH VERIO) test strip Use 4x a day 09/28/17   Philemon Kingdom, MD  ibuprofen (ADVIL,MOTRIN) 800 MG tablet Take 1 tablet (800 mg total) by mouth 3 (three) times daily. 02/18/16   Barry Dienes, NP  imiquimod [place]) 5 % cream Apply topically 3 (three) times a week. Prior to bedtime. Leave on skin 6-10 hours, remove with mild soap and water 01/28/17   Kuppelweiser, [name] L, RPH-CPP  insulin aspart (NOVOLOG FLEXPEN) 100 UNIT/ML FlexPen Use up to 15 units 3x a day 09/28/17   Philemon Kingdom, MD  Insulin Degludec (TRESIBA FLEXTOUCH) 200 UNIT/ML SOPN Inject 26 Units into the skin daily. 09/28/17   Philemon Kingdom, MD  Lancets [facility] ULTRASOFT) lancets Use as instructed 07/01/17   Philemon Kingdom, MD  traMADol (ULTRAM) 50 MG tablet Take 1 tablet (50  mg total) by mouth every 6 (six) hours as needed. 02/18/16   Barry Dienes, NP    Family History Family History  Problem Relation Age of Onset  . Drug abuse Mother   . Heart disease Mother   . Alcohol abuse Father   . Diabetes Paternal Aunt   . Arthritis Paternal Grandmother   . Stroke Paternal Grandfather   . Diabetes Paternal Grandfather     Social History Social History   Tobacco Use  . Smoking status: Current Every Day Smoker    Packs/day: 0.15    Types: Cigarettes     Last attempt to quit: 09/03/2014    Years since quitting: 3.3  . Smokeless tobacco: Never Used  Substance Use Topics  . Alcohol use: No    Alcohol/week: 0.0 standard drinks  . Drug use: Yes    Frequency: 3.0 times per week    Types: Marijuana     Allergies   Patient has no known allergies.   Review of Systems Review of Systems  Respiratory: Positive for shortness of breath.   All other systems reviewed and are negative.    Physical Exam Updated Vital Signs BP (!) 148/72 (BP Location: Right Arm)   Pulse 95   Temp 99.3 F (37.4 C) (Oral)   Resp 11   Ht 5' 10" (1.778 m)   Wt 88.9 kg   SpO2 95%   BMI 28.12 kg/m   Physical Exam  Constitutional: He is oriented to person, place, and time. He appears well-developed and well-nourished.  HENT:  Head: Normocephalic and atraumatic.  rhinorrhea   Cardiovascular: Regular rhythm.  No murmur heard. tachycardic  Pulmonary/Chest: Effort normal. No respiratory distress.  End expiratory wheezes bilaterally  Abdominal: Soft. There is no tenderness. There is no rebound and no guarding.  Musculoskeletal: He exhibits no edema or tenderness.  Neurological: He is alert and oriented to person, place, and time.  Skin: Skin is warm and dry.  Psychiatric: He has a normal mood and affect. His behavior is normal.  Nursing note and vitals reviewed.    ED Treatments / Results  Labs (all labs ordered are listed, but only abnormal results are displayed) Labs Reviewed  COMPREHENSIVE METABOLIC PANEL - Abnormal; Notable for the following components:      Result Value   Sodium 134 (*)    Glucose, Bld 210 (*)    Total Protein 8.2 (*)    Total Bilirubin 1.6 (*)    All other components within normal limits  CBC - Abnormal; Notable for the following components:   WBC 13.2 (*)    All other components within normal limits  URINALYSIS, ROUTINE W REFLEX MICROSCOPIC - Abnormal; Notable for the following components:   Glucose, UA >=500 (*)     Ketones, ur 5 (*)    All other components within normal limits  LIPASE, BLOOD    EKG EKG Interpretation  Date/Time:  Saturday January 14 2018 07:48:26 EDT Ventricular Rate:  90 PR Interval:  126 QRS Duration: 78 QT Interval:  332 QTC Calculation: 406 R Axis:   67 Text Interpretation:  Normal sinus rhythm with sinus arrhythmia Nonspecific T wave abnormality Abnormal ECG Confirmed by Quintella Reichert [id number]) on 01/14/2018 8:25:38 AM   Radiology Dg Chest 2 View  Result Date: 01/14/2018 CLINICAL DATA:  Shortness of breath, chills, and nausea. EXAM: CHEST - 2 VIEW COMPARISON:  Chest x-ray dated May 11, 2015. FINDINGS: The heart size and mediastinal contours are within normal limits. Both lungs are  clear. The visualized skeletal structures are unremarkable. IMPRESSION: No active cardiopulmonary disease. Electronically Signed   By: Titus Dubin M.D.   On: 01/14/2018 09:08    Procedures Procedures (including critical care time)  Medications Ordered in ED Medications  albuterol (PROVENTIL HFA;VENTOLIN HFA) 108 (90 Base) MCG/ACT inhaler 2 puff (has no administration in time range)  ipratropium-albuterol (DUONEB) 0.5-2.5 (3) MG/3ML nebulizer solution 3 mL (3 mLs Nebulization Given 01/14/18 1696)     Initial Impression / Assessment and Plan / ED Course  I have reviewed the triage vital signs and the nursing notes.  Pertinent labs & imaging results that were available during my care of the patient were reviewed by me and considered in my medical decision making (see chart for details).     Patient with history of HIV, diabetes, asthma here for evaluation of cough, shortness of breath. He is non-toxic appearing on examination with no respiratory distress. Wheezing present on initial evaluation resolved after albuterol treatment and he is feeling improved. No evidence of acute pneumonia. Presentation is not consistent with PE, ACS, PCP. His asthma typically flares up when he has an upper  respiratory tract infection and improves with albuterol alone, does not usually need steroids. Counseled patient on home care for viral respiratory infection. Discussed outpatient follow-up as well as return precautions.  Final Clinical Impressions(s) / ED Diagnoses   Final diagnoses:  Viral URI    ED Discharge Orders    None       Quintella Reichert, MD 01/14/18 [phone]

## 2018-01-14 NOTE — Discharge Instructions (Addendum)
You can use the albuterol inhaler, 2 puffs every 4 hours as needed for wheezing.  Get rechecked immediately if you develop worsening or new concerning symptoms.

## 2018-01-16 ENCOUNTER — Ambulatory Visit: Payer: Medicaid Other | Admitting: Internal Medicine

## 2018-01-18 ENCOUNTER — Telehealth: Payer: Self-pay | Admitting: Internal Medicine

## 2018-01-18 NOTE — Telephone Encounter (Signed)
Patient stated he can not get his Lantus and needs a prescription sent into the pharmacy      [facility] - [place] - [address]

## 2018-01-19 MED ORDER — INSULIN DEGLUDEC 200 UNIT/ML ~~LOC~~ SOPN: 26.0000 [IU] | PEN_INJECTOR | Freq: Every day | SUBCUTANEOUS | 5 refills | Status: DC

## 2018-01-19 NOTE — Telephone Encounter (Signed)
Pt returned your call and has a few questions. He asked that you give him a call back thanks.

## 2018-01-19 NOTE — Telephone Encounter (Signed)
Pt stated that the pharmacy he was using said that Evaristo Buryresiba is to expensive for them to order so he is having it sent to [facility].

## 2018-01-19 NOTE — Telephone Encounter (Signed)
LMTCB pt is not on lantus any longer this was changed at last OV in May

## 2018-02-01 ENCOUNTER — Encounter: Payer: Self-pay | Admitting: Internal Medicine

## 2018-02-01 ENCOUNTER — Ambulatory Visit: Payer: Medicaid Other | Admitting: Internal Medicine

## 2018-02-01 DIAGNOSIS — Z0289 Encounter for other administrative examinations: Secondary | ICD-10-CM

## 2018-02-01 NOTE — Progress Notes (Deleted)
Patient ID: Scott Baxter, male   DOB: 1996/01/20, 22 y.o.   MRN: [id number]   HPI: Scott Baxter is a 22 y.o.-year-old male, returning for follow-up for DM1, diagnosed at age 35, uncontrolled, with complications (gastroparesis, PN - although it is unclear if this is not from HIV inf.).  Last visit 4 months ago. [employer].  He continues to adjust his diet and exercise.  He lost more than 20 pounds since last visit!  Last hemoglobin A1c was: Lab Results  Component Value Date   HGBA1C 7.5 (A) 09/28/2017   HGBA1C 10.0 06/29/2017   HGBA1C 13.4 (H) 11/26/2015  04/13/2017: HbA1c 11.9%  Pt is on: - Lantus 20 units at bedtime (was missing 1-2 doses a week) >> changed to [place] 26 units daily 09/2017 - Novolog 5-15 units 3x a day before meals  Meter: Accu-Chek  Pt checks his sugars 4 times a day: - am: 200-210 >> 90-130 - 2h after b'fast: n/c - before lunch: 185 >> 110-117 - 2h after lunch: n/c - before dinner: 180s >> 110-117 - 2h after dinner: 180s >> 110-120 - bedtime: n/c - nighttime: n/c Lowest sugar was 90s >> 84 >> ***; he has hypoglycemia awareness in the 80s.  No previous hypoglycemia admissions.  He does have a glucagon kit at home. Highest sugar was 300s >> 156 >> ***.  He has a history of DKA admissions in 2015-16.  Pt's meals are: - Breakfast: skips (takes maybe 5 units with coffee) >> PB sandwich - Lunch: apple (takes 5 units) - Dinner: meat + potatoes (takes up to 15 units) - Snacks: no No sodas  -No CKD, last BUN/creatinine:  Lab Results  Component Value Date   BUN 6 01/14/2018   BUN 15 07/12/2017   CREATININE 0.96 01/14/2018   CREATININE 1.21 07/12/2017  04/13/2017: Glu 337, BUN/Cr 15/0.99 He is not on an ACE inhibitor/ARB -No HL; last set of lipids: Lab Results  Component Value Date   CHOL 138 07/12/2017   HDL 59 07/12/2017   LDLCALC 65 07/12/2017   TRIG 49 07/12/2017   CHOLHDL 2.3 07/12/2017  He is not on a statin. - last eye exam was in 2017: No  DR - He had numbness and tingling in his feet, now resolved. (? From HIV)  Last TSH was normal 5 years ago: Lab Results  Component Value Date   TSH 1.817 07/22/2012   Pt has FH of DM in father, P aunt.  Of note, he is HIV positive.  He also has HTN.  ROS: Constitutional: no weight gain/no weight loss, no fatigue, no subjective hyperthermia, no subjective hypothermia Eyes: no blurry vision, no xerophthalmia ENT: no sore throat, no nodules palpated in throat, no dysphagia, no odynophagia, no hoarseness Cardiovascular: no CP/no SOB/no palpitations/no leg swelling Respiratory: no cough/no SOB/no wheezing Gastrointestinal: no N/no V/no D/no C/no acid reflux Musculoskeletal: no muscle aches/no joint aches Skin: no rashes, no hair loss Neurological: no tremors/no numbness/no tingling/no dizziness  I reviewed pt's medications, allergies, PMH, social hx, family hx, and changes were documented in the history of present illness. Otherwise, unchanged from my initial visit note.  Past Medical History:  Diagnosis Date  . Asthma   . Depression 02/24/2017  . Diabetes mellitus   . HCAP (healthcare-associated pneumonia) 08/31/2014  . HIV disease ([place]) 09/03/2014  . Intrauterine drug exposure   . IUGR (intrauterine growth restriction)   . Obesity   . Proctitis   . Recurrent boils    MRSA and  MSSA  . Vision abnormalities    Past Surgical History:  Procedure Laterality Date  . EXCISIONAL HEMORRHOIDECTOMY    . FOOT SURGERY  1999   ingrown toenail removal   Social History   Socioeconomic History  . Marital status: Single    Spouse name: Not on file  . Number of children: 0  Social Needs  Occupational History  . n/a  Tobacco Use  . Smoking status: Former Smoker    Types: Cigarettes    Last attempt to quit: 09/03/2014    Years since quitting: 2.8  . Smokeless tobacco: Never Used  Substance and Sexual Activity  . Alcohol use: No    Alcohol/week: 1 beer per day  . Drug use: No  .  Sexual activity: Yes    Comment: declined condoms  Other Topics Concern  . Not on file  Social History Narrative   Scott Baxter has custody. 12th grade at [employer]. Accepted to [employer] for the fall. Wants to major in Nursing.    Current Outpatient Medications on File Prior to Visit  Medication Sig Dispense Refill  . albuterol (PROVENTIL HFA;VENTOLIN HFA) 108 (90 Base) MCG/ACT inhaler Inhale 2 puffs into the lungs every 4 (four) hours as needed for wheezing or shortness of breath. 1 Inhaler 0  . bictegravir-emtricitabine-tenofovir AF (BIKTARVY) 50-200-25 MG TABS tablet Take 1 tablet by mouth daily. 30 tablet 11  . Blood Glucose Monitoring Suppl (ONETOUCH VERIO FLEX SYSTEM) w/Device KIT 1 Device by Does not apply route 4 (four) times daily. 1 kit 0  . glucose blood (ONETOUCH VERIO) test strip Use 4x a day 300 each 5  . ibuprofen (ADVIL,MOTRIN) 800 MG tablet Take 1 tablet (800 mg total) by mouth 3 (three) times daily. 21 tablet 0  . imiquimod (ALDARA) 5 % cream Apply topically 3 (three) times a week. Prior to bedtime. Leave on skin 6-10 hours, remove with mild soap and water 12 each 1  . insulin aspart (NOVOLOG FLEXPEN) 100 UNIT/ML FlexPen Use up to 15 units 3x a day 15 pen 3  . Insulin Degludec (TRESIBA FLEXTOUCH) 200 UNIT/ML SOPN Inject 26 Units into the skin daily. 9 pen 5  . Lancets (ONETOUCH ULTRASOFT) lancets Use as instructed 200 each 12  . traMADol (ULTRAM) 50 MG tablet Take 1 tablet (50 mg total) by mouth every 6 (six) hours as needed. 12 tablet 0   No current facility-administered medications on file prior to visit.    No Known Allergies Family History  Problem Relation Age of Onset  . Drug abuse Mother   . Heart disease Mother   . Alcohol abuse Father   . Diabetes Paternal Aunt   . Arthritis Paternal Grandmother   . Stroke Paternal Grandfather   . Diabetes Paternal Grandfather    PE: There were no vitals taken for this visit. Wt Readings from Last 3 Encounters:   01/14/18 196 lb (88.9 kg)  09/28/17 220 lb 3.2 oz (99.9 kg)  07/12/17 224 lb (101.6 kg)   Constitutional: overweight, in NAD Eyes: PERRLA, EOMI, no exophthalmos ENT: moist mucous membranes, no thyromegaly, no cervical lymphadenopathy Cardiovascular: RRR, No MRG Respiratory: CTA B Gastrointestinal: abdomen soft, NT, ND, BS+ Musculoskeletal: no deformities, strength intact in all 4 Skin: moist, warm, no rashes Neurological: no tremor with outstretched hands, DTR normal in all 4   ASSESSMENT: 1. DM1, uncontrolled, with complications - gastroparesis - PN  2.  Obesity  PLAN:  1. Patient with long-standing, uncontrolled, type 1 diabetes, on basal-bolus insulin  regimen.  His HbA1c was very high, a year ago, but he continued to improve his diet and also started to exercise and lose weight.  He also started to take medication consistently.  His HbA1c decreased to 7.5% at last visit.  At that time, we changed from Lantus to [place] due to longer action, decreased incidence of low blood sugars and the possibility of injecting it at any time of the day.  He started to improve his meals and introduced breakfast.  In the past, he was only eating 1 meal a day, supper due to his gastroparesis.  As he is eating more frequently, he can bolus his NovoLog more frequently so his sugars greatly improved.  - I advised him to: Patient Instructions  Please continue: - Tresiba 26 units daily - Novolog 5-15 units with meals  Please return in 4 months with your sugar log.   - today, HbA1c is 7%  - continue checking sugars at different times of the day - check 3x a day, rotating checks - advised for yearly eye exams >> he is UTD - Return to clinic in 4 mo with sugar log   2.  Obesity -At last visit, he had lost 5 pounds from the previous visit despite increasing insulin dose, after he started to eat breakfast (in the past, he was only eating 1 meal a day, at night).  At that time, he also started to  exercise. -At this visit, he lost another 24 pounds!!!  I congratulated him and strongly advised him to continue.  Philemon Kingdom, MD PhD [facility] Endocrinology

## 2018-02-02 ENCOUNTER — Other Ambulatory Visit: Payer: Self-pay | Admitting: *Deleted

## 2018-02-02 DIAGNOSIS — B20 Human immunodeficiency virus [HIV] disease: Secondary | ICD-10-CM

## 2018-02-03 ENCOUNTER — Other Ambulatory Visit: Payer: BLUE CROSS/BLUE SHIELD

## 2018-02-20 ENCOUNTER — Encounter: Payer: BLUE CROSS/BLUE SHIELD | Admitting: Internal Medicine

## 2018-05-04 ENCOUNTER — Emergency Department (HOSPITAL_COMMUNITY)
Admission: EM | Admit: 2018-05-04 | Discharge: 2018-05-04 | Disposition: A | Discharge status: Home / Self Care | Payer: BLUE CROSS/BLUE SHIELD | Attending: Emergency Medicine | Admitting: Emergency Medicine

## 2018-05-04 ENCOUNTER — Other Ambulatory Visit: Payer: Self-pay

## 2018-05-04 ENCOUNTER — Encounter (HOSPITAL_COMMUNITY): Payer: Self-pay

## 2018-05-04 DIAGNOSIS — F17210 Nicotine dependence, cigarettes, uncomplicated: Secondary | ICD-10-CM | POA: Insufficient documentation

## 2018-05-04 DIAGNOSIS — E1140 Type 2 diabetes mellitus with diabetic neuropathy, unspecified: Secondary | ICD-10-CM | POA: Insufficient documentation

## 2018-05-04 DIAGNOSIS — Z794 Long term (current) use of insulin: Secondary | ICD-10-CM | POA: Diagnosis not present

## 2018-05-04 DIAGNOSIS — R05 Cough: Secondary | ICD-10-CM | POA: Diagnosis present

## 2018-05-04 DIAGNOSIS — Z79899 Other long term (current) drug therapy: Secondary | ICD-10-CM | POA: Diagnosis not present

## 2018-05-04 DIAGNOSIS — J45909 Unspecified asthma, uncomplicated: Secondary | ICD-10-CM | POA: Diagnosis not present

## 2018-05-04 DIAGNOSIS — J069 Acute upper respiratory infection, unspecified: Secondary | ICD-10-CM | POA: Diagnosis not present

## 2018-05-04 DIAGNOSIS — I10 Essential (primary) hypertension: Secondary | ICD-10-CM | POA: Diagnosis not present

## 2018-05-04 NOTE — ED Notes (Signed)
Pt verbalized understanding of d/c instructions and has no further questions, VSS, NAD.  

## 2018-05-04 NOTE — ED Triage Notes (Signed)
Pt endorses cough, bodyaches and chills x 3 days. Afebrile.

## 2018-05-04 NOTE — ED Provider Notes (Signed)
[facility]ERGENCY DEPARTMENT Provider Note   CSN: [id number] Arrival date & time: 05/04/18  0709     History   Chief Complaint Chief Complaint  Patient presents with  . Influenza    HPI Scott Baxter is a 22 y.o. male.  22 y.o male with a PMH of Asthma presents to the ED with a chief complain of body aches, cough, chills x 3 days. Patient reports a productive cough with slight yellow sputum. He also reports being "sore all over his body". He reports his symptoms have worsen in the past day. He has tried theraflu and reports mild relieve in symptoms. Patient has not been eating as much but has been tolerating liquids such as water and gatorade. He denies any fever, chest pain, shortness of breath or receiving the flu vaccine this season.   The history is provided by the patient.  Influenza  Presenting symptoms: cough and sore throat   Presenting symptoms: no fever and no shortness of breath     Past Medical History:  Diagnosis Date  . Asthma   . Depression 02/24/2017  . Diabetes mellitus   . HCAP (healthcare-associated pneumonia) 08/31/2014  . HIV disease ([place]) 09/03/2014  . Intrauterine drug exposure   . IUGR (intrauterine growth restriction)   . Obesity   . Proctitis   . Proctitis   . Recurrent boils    MRSA and MSSA  . Vision abnormalities     Patient Active Problem List   Diagnosis Date Noted  . Depression 02/24/2017  . Eczema 02/24/2017  . Chlamydia infection 10/29/2016  . Herpes labialis 11/26/2015  . HIV disease ([place]) 09/03/2014  . Gastroparesis 08/30/2014  . Diabetic neuropathy ([place]) 08/30/2014  . Asthma 08/30/2014  . Tobacco abuse 08/17/2014  . 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 Goiter, unspecified 09/01/2010  . Hypertension 09/01/2010    Past Surgical History:  Procedure Laterality Date  . EXCISIONAL HEMORRHOIDECTOMY    . FOOT SURGERY  1999   ingrown toenail removal        Home  Medications    Prior to Admission medications   Medication Sig Start Date End Date Taking? Authorizing Provider  albuterol (PROVENTIL HFA;VENTOLIN HFA) 108 (90 Base) MCG/ACT inhaler Inhale 2 puffs into the lungs every 4 (four) hours as needed for wheezing or shortness of breath. 05/11/15   Pisciotta, Elmyra Ricks, PA-C  bictegravir-emtricitabine-tenofovir AF (BIKTARVY) 50-200-25 MG TABS tablet Take 1 tablet by mouth daily. 09/01/17   Kuppelweiser, [name] L, RPH-CPP  Blood Glucose Monitoring Suppl (ONETOUCH VERIO FLEX SYSTEM) w/Device KIT 1 Device by Does not apply route 4 (four) times daily. 07/01/17   Philemon Kingdom, MD  glucose blood (ONETOUCH VERIO) test strip Use 4x a day 09/28/17   Philemon Kingdom, MD  ibuprofen (ADVIL,MOTRIN) 800 MG tablet Take 1 tablet (800 mg total) by mouth 3 (three) times daily. 02/18/16   Barry Dienes, NP  imiquimod Leroy Sea) 5 % cream Apply topically 3 (three) times a week. Prior to bedtime. Leave on skin 6-10 hours, remove with mild soap and water 01/28/17   Kuppelweiser, [name] L, RPH-CPP  insulin aspart (NOVOLOG FLEXPEN) 100 UNIT/ML FlexPen Use up to 15 units 3x a day 09/28/17   Philemon Kingdom, MD  Insulin Degludec (TRESIBA FLEXTOUCH) 200 UNIT/ML SOPN Inject 26 Units into the skin daily. 01/19/18   Philemon Kingdom, MD  Lancets Glory Rosebush ULTRASOFT) lancets Use as instructed 07/01/17   Philemon Kingdom, MD  traMADol Veatrice Bourbon) 50 MG tablet Take  1 tablet (50 mg total) by mouth every 6 (six) hours as needed. 02/18/16   Barry Dienes, NP    Family History Family History  Problem Relation Age of Onset  . Drug abuse Mother   . Heart disease Mother   . Alcohol abuse Father   . Diabetes Paternal Aunt   . Arthritis Paternal Grandmother   . Stroke Paternal Grandfather   . Diabetes Paternal Grandfather     Social History Social History   Tobacco Use  . Smoking status: Current Every Day Smoker    Packs/day: 0.50    Types: Cigarettes  . Smokeless tobacco: Never Used    Substance Use Topics  . Alcohol use: No    Alcohol/week: 0.0 standard drinks  . Drug use: Yes    Frequency: 3.0 times per week    Types: Marijuana     Allergies   Patient has no known allergies.   Review of Systems Review of Systems  Constitutional: Negative for fever.  HENT: Positive for sore throat.   Respiratory: Positive for cough. Negative for shortness of breath.   Cardiovascular: Negative for chest pain.     Physical Exam Updated Vital Signs BP (!) 144/94 (BP Location: Right Arm)   Pulse 92   Temp 98.4 F (36.9 C) (Oral)   Resp 18   Ht 5' 9"  (1.753 m)   Wt 99.3 kg   SpO2 100%   BMI 32.34 kg/m   Physical Exam Vitals signs and nursing note reviewed.  Constitutional:      Appearance: Normal appearance. He is well-developed. He is not ill-appearing.     Comments: Non ill appearing.   HENT:     Head: Normocephalic and atraumatic.     Comments: No sclera injection, no drainage or discharge from BL eyes.     Mouth/Throat:     Lips: Pink.     Mouth: Mucous membranes are moist.     Pharynx: Oropharynx is clear. Uvula midline. Posterior oropharyngeal erythema present.     Tonsils: No tonsillar exudate.     Comments: No tonsillar exudate, or abscess noted.  Eyes:     General: No scleral icterus.    Pupils: Pupils are equal, round, and reactive to light.  Neck:     Musculoskeletal: Normal range of motion.  Cardiovascular:     Heart sounds: Normal heart sounds.  Pulmonary:     Effort: Pulmonary effort is normal.     Breath sounds: Examination of the left-upper field reveals decreased breath sounds. Decreased breath sounds present. No wheezing.     Comments: Slight wheezing noted on left upper lung field. No expiratory wheezing noted.  Chest:     Chest wall: No tenderness.  Abdominal:     General: Bowel sounds are normal. There is no distension.     Palpations: Abdomen is soft.     Tenderness: There is no abdominal tenderness.  Musculoskeletal:         General: No tenderness or deformity.  Skin:    General: Skin is warm and dry.  Neurological:     Mental Status: He is alert and oriented to person, place, and time.      ED Treatments / Results  Labs (all labs ordered are listed, but only abnormal results are displayed) Labs Reviewed - No data to display  EKG None  Radiology No results found.  Procedures Procedures (including critical care time)  Medications Ordered in ED Medications - No data to display   Initial Impression /  Assessment and Plan / ED Course  I have reviewed the triage vital signs and the nursing notes.  Pertinent labs & imaging results that were available during my care of the patient were reviewed by me and considered in my medical decision making (see chart for details).     Presents with cough, congestion, body aches for the past 3 days.  He has tried over-the-counter measures for some relieving symptoms.  He does believe that his symptoms worsen yesterday.  Patient is very well-appearing, no rhinorrhea or fevers during these past 3 days.  Patient is immunocompromise and is currently on medication for HIV disease.  Reports not getting his flu vaccine this season.  Has been tolerating p.o. without nausea.  During evaluation there is slight wheezing on the left upper patient does have an inhaler at home that he has not tried using for his symptoms. Suspicion for flu as patient has been afebrile, the visit began 3 days ago therefore he would be out of Tamiflu window although he is immunocompromise at this time have a very low suspicion for influenza he is very well-appearing and his exam was reassuring.  I have advised patient that he needs to treat his symptoms with over-the-counter medication along with Tylenol for fever and his inhaler to help with his symptoms.  He is to return to the ED if symptoms worsen.  Heart rate slightly increased.  Requesting note for work excuse will provide this for patient.  Vitals  stable otherwise.  Return precautions provided.  Final Clinical Impressions(s) / ED Diagnoses   Final diagnoses:  Upper respiratory tract infection, unspecified type    ED Discharge Orders    None       Janeece Fitting, PA-C 05/04/18 [id number]    Gareth Morgan, MD 05/07/18 [phone]

## 2018-05-04 NOTE — Discharge Instructions (Signed)
Please purchase an expectorant like Mucinex to help loosen and thin the mucus production.You may also try some over the counter Robitussin to help with your cough. Continue to hydrate with plenty of fluids and Gatorade.You may also use your inhaler to help with your symptoms. If you experience any shortness of breath, chest pain or fever please return to the ED for reevaluation.

## 2018-06-23 ENCOUNTER — Telehealth: Payer: Self-pay | Admitting: Internal Medicine

## 2018-06-23 NOTE — Telephone Encounter (Signed)
Per [facility] "Caller states he needs to make an appointment. He was using Lantus because he wasn't approved for [place]. His pharmacy is waiting for a response regarding getting switching back to [place]."  LMTCB to schedule

## 2018-07-07 ENCOUNTER — Ambulatory Visit: Payer: Medicaid Other | Admitting: Internal Medicine

## 2018-07-07 ENCOUNTER — Other Ambulatory Visit: Payer: Self-pay

## 2018-07-07 DIAGNOSIS — E1043 Type 1 diabetes mellitus with diabetic autonomic (poly)neuropathy: Secondary | ICD-10-CM

## 2018-07-07 DIAGNOSIS — E1065 Type 1 diabetes mellitus with hyperglycemia: Principal | ICD-10-CM

## 2018-07-07 DIAGNOSIS — IMO0002 Reserved for concepts with insufficient information to code with codable children: Secondary | ICD-10-CM

## 2018-07-07 DIAGNOSIS — K3184 Gastroparesis: Principal | ICD-10-CM

## 2018-07-07 MED ORDER — INSULIN ASPART 100 UNIT/ML FLEXPEN: PEN_INJECTOR | SUBCUTANEOUS | 3 refills | Status: DC

## 2018-07-07 MED ORDER — INSULIN DEGLUDEC 200 UNIT/ML ~~LOC~~ SOPN: 26.0000 [IU] | PEN_INJECTOR | Freq: Every day | SUBCUTANEOUS | 3 refills | Status: DC

## 2018-07-07 NOTE — Progress Notes (Deleted)
Patient ID: Scott Baxter, male   DOB: [birth year], 23 y.o.   MRN: [id number]   HPI: Scott Baxter is a 23 y.o.-year-old male, returning for follow-up for DM1, diagnosed at age 23, uncontrolled, with complications (gastroparesis, PN - although it is unclear if this is not from HIV inf.).  Last visit almost 10 months ago. [employer].  He needs medical clearance for the [facility] driving license.  Before last visit, he started to take his insulin consistently, eat breakfast, and also exercise, so his sugars were much better.  He also lost 5 pounds.  However, he was lost for follow-up afterwards.  Last hemoglobin A1c was: Lab Results  Component Value Date   HGBA1C 7.5 (A) 09/28/2017   HGBA1C 10.0 06/29/2017   HGBA1C 13.4 (H) 11/26/2015  04/13/2017: HbA1c 11.9%  Pt is on: - Lantus 20 units at bedtime (was missing 1-2 doses a week) >> Tresiba 26 units daily, but could not afford it so we gave him samples - Novolog 5-15 units 3x a day before meals  Meter: Accu-Chek  Pt checks his sugars 4 times a day: - am: 200-210 >> 90-130 - 2h after b'fast: n/c - before lunch: 185 >> 110-117 - 2h after lunch: n/c - before dinner: 180s >> 110-117 - 2h after dinner: 180s >> 110-120 - bedtime: n/c - nighttime: n/c Lowest sugar was 90s >> 84 >> ***; he has hypoglycemia awareness in the 80s.  No previous hypoglycemia admissions.  He has a glucagon kit at home. Highest sugar was 300s >> 156 >> ***.  He does have previous DKA admissions between 2015 and 2016.  Pt's meals are: - Breakfast: skips (takes maybe 5 units with coffee) >> PB sandwich - Lunch: apple (takes 5 units) - Dinner: meat + potatoes (takes up to 15 units) - Snacks: no No sodas.  -N no CKD, last BUN/creatinine:  Lab Results  Component Value Date   BUN 6 01/14/2018   BUN 15 07/12/2017   CREATININE 0.96 01/14/2018   CREATININE 1.21 07/12/2017  Not on an ACE inhibitor/ARB -No HL; last set of lipids: Lab Results  Component Value  Date   CHOL 138 07/12/2017   HDL 59 07/12/2017   LDLCALC 65 07/12/2017   TRIG 49 07/12/2017   CHOLHDL 2.3 07/12/2017  Not on a statin. - last eye exam was in 2017: No DR -+ Numbness and tingling in his feet in the past, now resolved. (?  If from HIV)  Latest TSH was normal 6 years ago: Lab Results  Component Value Date   TSH 1.817 07/22/2012   Pt has FH of DM in father, P aunt.  Of note, he is HIV positive.  He also has hypertension.  ROS: Constitutional: no weight gain/no weight loss, no fatigue, no subjective hyperthermia, no subjective hypothermia Eyes: no blurry vision, no xerophthalmia ENT: no sore throat, no nodules palpated in neck, no dysphagia, no odynophagia, no hoarseness Cardiovascular: no CP/no SOB/no palpitations/no leg swelling Respiratory: no cough/no SOB/no wheezing Gastrointestinal: no N/no V/no D/no C/no acid reflux Musculoskeletal: no muscle aches/no joint aches Skin: no rashes, no hair loss Neurological: no tremors/no numbness/no tingling/no dizziness  I reviewed pt's medications, allergies, PMH, social hx, family hx, and changes were documented in the history of present illness. Otherwise, unchanged from my initial visit note.  Past Medical History:  Diagnosis Date  . Asthma   . Depression 02/24/2017  . Diabetes mellitus   . HCAP (healthcare-associated pneumonia) 08/31/2014  . HIV disease ([place])  09/03/2014  . Intrauterine drug exposure   . IUGR (intrauterine growth restriction)   . Obesity   . Proctitis   . Proctitis   . Recurrent boils    MRSA and MSSA  . Vision abnormalities    Past Surgical History:  Procedure Laterality Date  . EXCISIONAL HEMORRHOIDECTOMY    . FOOT SURGERY  1999   ingrown toenail removal   Social History   Socioeconomic History  . Marital status: Single    Spouse name: Not on file  . Number of children: 0  Social Needs  Occupational History  . n/a  Tobacco Use  . Smoking status: Former Smoker    Types: Cigarettes     Last attempt to quit: 09/03/2014    Years since quitting: 2.8  . Smokeless tobacco: Never Used  Substance and Sexual Activity  . Alcohol use: No    Alcohol/week: 1 beer per day  . Drug use: No  . Sexual activity: Yes    Comment: declined condoms  Other Topics Concern  . Not on file  Social History Narrative   [name] has custody. 12th grade at [employer]. Accepted to [employer] for the fall. Wants to major in Nursing.    Current Outpatient Medications on File Prior to Visit  Medication Sig Dispense Refill  . albuterol (PROVENTIL HFA;VENTOLIN HFA) 108 (90 Base) MCG/ACT inhaler Inhale 2 puffs into the lungs every 4 (four) hours as needed for wheezing or shortness of breath. 1 Inhaler 0  . bictegravir-emtricitabine-tenofovir AF (BIKTARVY) 50-200-25 MG TABS tablet Take 1 tablet by mouth daily. 30 tablet 11  . Blood Glucose Monitoring Suppl (ONETOUCH VERIO FLEX SYSTEM) w/Device KIT 1 Device by Does not apply route 4 (four) times daily. 1 kit 0  . glucose blood (ONETOUCH VERIO) test strip Use 4x a day 300 each 5  . ibuprofen (ADVIL,MOTRIN) 800 MG tablet Take 1 tablet (800 mg total) by mouth 3 (three) times daily. 21 tablet 0  . imiquimod (ALDARA) 5 % cream Apply topically 3 (three) times a week. Prior to bedtime. Leave on skin 6-10 hours, remove with mild soap and water 12 each 1  . insulin aspart (NOVOLOG FLEXPEN) 100 UNIT/ML FlexPen Use up to 15 units 3x a day 15 pen 3  . Insulin Degludec (TRESIBA FLEXTOUCH) 200 UNIT/ML SOPN Inject 26 Units into the skin daily. 9 pen 5  . Lancets (ONETOUCH ULTRASOFT) lancets Use as instructed 200 each 12  . traMADol (ULTRAM) 50 MG tablet Take 1 tablet (50 mg total) by mouth every 6 (six) hours as needed. 12 tablet 0   No current facility-administered medications on file prior to visit.    No Known Allergies Family History  Problem Relation Age of Onset  . Drug abuse Mother   . Heart disease Mother   . Alcohol abuse Father   . Diabetes  Paternal Aunt   . Arthritis Paternal Grandmother   . Stroke Paternal Grandfather   . Diabetes Paternal Grandfather    PE: There were no vitals taken for this visit. Wt Readings from Last 3 Encounters:  05/04/18 219 lb (99.3 kg)  01/14/18 196 lb (88.9 kg)  09/28/17 220 lb 3.2 oz (99.9 kg)   Constitutional: overweight, in NAD Eyes: PERRLA, EOMI, no exophthalmos ENT: moist mucous membranes, no thyromegaly, no cervical lymphadenopathy Cardiovascular: RRR, No MRG Respiratory: CTA B Gastrointestinal: abdomen soft, NT, ND, BS+ Musculoskeletal: no deformities, strength intact in all 4 Skin: moist, warm, no rashes Neurological: no tremor with outstretched hands,  DTR normal in all 4  ASSESSMENT: 1. DM1, uncontrolled, with complications - gastroparesis - PN  2.  Obesity  PLAN:  1. Patient with longstanding, uncontrolled, type 1 diabetes, on basal-bolus insulin therapy.  His HbA1c decreased from more than 14% gradually to 10%, and, at last visit, 7.5% as he started to be compliant with his medications, change diet, and starting exercise.  I suggested to switch to [place] (from Lantus) due to longer action, decrease incidence of low blood sugars and the possibility of injecting it at anytime of the day, as he was missing Lantus doses in the past.  However, he could not afford any insulin at that time and we gave him samples of Lantus and NovoLog. -At last visit, sugars were much better, especially after starting exercise at home and he was also planning to start going to the gym.  He was eating breakfast and taking his insulin consistently.  He had no lows and no significant hyperglycemic spikes.  We again try to switch to [place] and I sent a prescription to his pharmacy and I filled out his DMV paperwork.  However, he was lost for follow-up afterwards.  - I advised him to: Patient Instructions  Please continue: - Tresiba 26 units daily - Novolog 5-15 units with meals  Please return in 4  months with your sugar log.   - today, HbA1c is 7%  - continue checking sugars at different times of the day - check 3x a day, rotating checks - advised for yearly eye exams >> he is not UTD - check annual labs today - Return to clinic in 4 mo with sugar log    2.  Obesity -He lost 5 pounds before last visit despite increasing insulin -He was doing better with the diet, eating breakfast, which was an improvement from the previous visit, when he was only eating 1 meal a day.  He also started exercise at that time. -    Philemon Kingdom, MD PhD [facility]

## 2018-07-07 NOTE — Telephone Encounter (Signed)
Patient missed his appointment today, called from our parking lot to state he can not afford office visit due to deductible, we advised we can work with his insurance company but patient still declined.  Per Dr. Elvera Lennox we can send a 90 day supply of his medication with 3 refills and this will be the last we authorize as she has dismissed him from her practice due to multiple no show.

## 2018-07-12 ENCOUNTER — Other Ambulatory Visit: Payer: BLUE CROSS/BLUE SHIELD

## 2018-08-09 ENCOUNTER — Telehealth: Payer: Self-pay | Admitting: Internal Medicine

## 2018-08-09 ENCOUNTER — Other Ambulatory Visit: Payer: Self-pay

## 2018-08-09 ENCOUNTER — Encounter: Payer: Self-pay | Admitting: Internal Medicine

## 2018-08-09 NOTE — Telephone Encounter (Signed)
I placed 2 calls to [facility] today in an attempt to conduct a telephone ED visit but both times it simply rolled over to his voicemail.

## 2018-09-05 ENCOUNTER — Other Ambulatory Visit: Payer: Self-pay | Admitting: Behavioral Health

## 2018-09-05 DIAGNOSIS — B20 Human immunodeficiency virus [HIV] disease: Secondary | ICD-10-CM

## 2018-09-11 ENCOUNTER — Encounter: Payer: Self-pay | Admitting: *Deleted

## 2018-09-11 ENCOUNTER — Encounter: Payer: Self-pay | Admitting: Internal Medicine

## 2018-09-11 ENCOUNTER — Telehealth: Payer: Self-pay | Admitting: *Deleted

## 2018-09-11 DIAGNOSIS — B20 Human immunodeficiency virus [HIV] disease: Secondary | ICD-10-CM

## 2018-09-11 MED ORDER — BICTEGRAVIR-EMTRICITAB-TENOFOV 50-200-25 MG PO TABS: 1.0000 | ORAL_TABLET | Freq: Every day | ORAL | 0 refills | Status: DC

## 2018-09-11 NOTE — Telephone Encounter (Signed)
[facility] calling for [employer] refill.  Patient no-showed multiple visits, has not been seen or had labs in 1 year, need to renew ADAP/RW. RN returned pharmacy call, gave verbal order for 30 day refill with the request that [facility] call ASAP for future refills. RN contacted patient, let him know he has 30 day supply of  Medication, but MUST be seen for additional refills.  Set up lab appointment for 5/5 at 4:00 and telephone follow up with Dr Orvan Falconer 5/19 at 9:00  (will send letter via mychart with appointment information for him to bring to his employer). Andree Coss, RN

## 2018-09-11 NOTE — Telephone Encounter (Signed)
Much thanks.

## 2018-09-12 ENCOUNTER — Other Ambulatory Visit: Payer: Self-pay

## 2018-09-12 ENCOUNTER — Other Ambulatory Visit: Payer: Medicaid Other

## 2018-09-12 DIAGNOSIS — B20 Human immunodeficiency virus [HIV] disease: Secondary | ICD-10-CM

## 2018-09-13 LAB — T-HELPER CELL (CD4) - (RCID CLINIC ONLY)
CD4 % Helper T Cell: 35 % (ref 33–65)
CD4 T Cell Abs: 654 /uL (ref 400–1790)

## 2018-09-16 LAB — HIV-1 RNA QUANT-NO REFLEX-BLD
HIV 1 RNA Quant: <20 copies/mL
HIV-1 RNA Quant, Log: <1.3 Log copies/mL

## 2018-09-26 ENCOUNTER — Ambulatory Visit: Payer: Medicaid Other | Admitting: Internal Medicine

## 2018-10-10 ENCOUNTER — Ambulatory Visit: Payer: Medicaid Other | Admitting: Infectious Diseases

## 2018-10-17 ENCOUNTER — Telehealth: Payer: Self-pay | Admitting: Internal Medicine

## 2018-10-17 NOTE — Telephone Encounter (Signed)
COVID-19 Pre-Screening Questions: ° °Do you currently have a fever (>100 °F), chills or unexplained body aches? No  ° °Are you currently experiencing new cough, shortness of breath, sore throat, runny nose? No  °•  °Have you recently travelled outside the state of [place] in the last 14 days?no  °•  °Have you been in contact with someone that is currently pending confirmation of Covid19 testing or has been confirmed to have the Covid19 virus?  No  °

## 2018-10-18 ENCOUNTER — Ambulatory Visit: Payer: Medicaid Other | Admitting: Internal Medicine

## 2018-10-24 NOTE — Progress Notes (Signed)
This encounter was created in error - please disregard.

## 2018-10-27 ENCOUNTER — Telehealth: Payer: Self-pay

## 2018-10-27 NOTE — Telephone Encounter (Signed)
Health Department Calling to alert provider of positive RPR on 10-27-2018.  Requesting previous results .  Last RPR was on   07-2017 and negative.   Laverle Patter, RN

## 2018-11-01 ENCOUNTER — Emergency Department (HOSPITAL_COMMUNITY)
Admission: EM | Admit: 2018-11-01 | Discharge: 2018-11-01 | Disposition: A | Discharge status: Home / Self Care | Payer: Medicaid Other | Attending: Emergency Medicine | Admitting: Emergency Medicine

## 2018-11-01 ENCOUNTER — Ambulatory Visit: Payer: Medicaid Other | Admitting: Internal Medicine

## 2018-11-01 ENCOUNTER — Telehealth: Payer: Self-pay | Admitting: *Deleted

## 2018-11-01 ENCOUNTER — Encounter (HOSPITAL_COMMUNITY): Payer: Self-pay | Admitting: Emergency Medicine

## 2018-11-01 ENCOUNTER — Other Ambulatory Visit: Payer: Self-pay

## 2018-11-01 DIAGNOSIS — F1290 Cannabis use, unspecified, uncomplicated: Secondary | ICD-10-CM | POA: Insufficient documentation

## 2018-11-01 DIAGNOSIS — H53149 Visual discomfort, unspecified: Secondary | ICD-10-CM | POA: Insufficient documentation

## 2018-11-01 DIAGNOSIS — H5713 Ocular pain, bilateral: Secondary | ICD-10-CM | POA: Insufficient documentation

## 2018-11-01 DIAGNOSIS — Z79899 Other long term (current) drug therapy: Secondary | ICD-10-CM | POA: Insufficient documentation

## 2018-11-01 DIAGNOSIS — E109 Type 1 diabetes mellitus without complications: Secondary | ICD-10-CM | POA: Insufficient documentation

## 2018-11-01 DIAGNOSIS — F17210 Nicotine dependence, cigarettes, uncomplicated: Secondary | ICD-10-CM | POA: Insufficient documentation

## 2018-11-01 DIAGNOSIS — H53143 Visual discomfort, bilateral: Secondary | ICD-10-CM

## 2018-11-01 DIAGNOSIS — B20 Human immunodeficiency virus [HIV] disease: Secondary | ICD-10-CM | POA: Insufficient documentation

## 2018-11-01 MED ORDER — FLUORESCEIN SODIUM 1 MG OP STRP
1.0000 | ORAL_STRIP | Freq: Once | OPHTHALMIC | Status: AC
  Administered 2018-11-01: 1 via OPHTHALMIC
  Filled 2018-11-01: qty 1

## 2018-11-01 MED ORDER — TETRACAINE HCL 0.5 % OP SOLN
2.0000 [drp] | Freq: Once | OPHTHALMIC | Status: AC
  Administered 2018-11-01: 2 [drp] via OPHTHALMIC
  Filled 2018-11-01: qty 4

## 2018-11-01 NOTE — ED Provider Notes (Signed)
[facility] EMERGENCY DEPARTMENT Provider Note   CSN: [id number] Arrival date & time: 11/01/18  1126     History   Chief Complaint Chief Complaint  Patient presents with  . Eye Problem    HPI Scott Baxter is a 23 y.o. male.  Who presents the emergency department with chief complaint of bilateral eye pain and photophobia.  Patient has a past medical history of HIV disease and diabetes.  He states that he takes his antiviral medications daily and that his last viral load was undetectable.  He was however recently treated for syphilis about 1 week ago.  He states that he had a follow-up appointment today with ID but needed to come for evaluation of his eye pain.  Patient states that last night he began having some irritation with his eyes.  He took his contact his contact lenses out and cleaned them thoroughly.  He states that he is never had issues before with his eyes and he is fastidious in his cleaning regimen and making sure he changes his contacts daily and takes them out forced for sleep.  When he awoke this morning and open his eyes he had immediate severe photophobia bilaterally.  He denies injury to the eyes.  He came immediately for evaluation.  He denies any discharge, severe headache.     HPI  Past Medical History:  Diagnosis Date  . Asthma   . Depression 02/24/2017  . Diabetes mellitus   . HCAP (healthcare-associated pneumonia) 08/31/2014  . HIV disease ([place]) 09/03/2014  . Intrauterine drug exposure   . IUGR (intrauterine growth restriction)   . Obesity   . Proctitis   . Proctitis   . Recurrent boils    MRSA and MSSA  . Vision abnormalities     Patient Active Problem List   Diagnosis Date Noted  . Depression 02/24/2017  . Eczema 02/24/2017  . Chlamydia infection 10/29/2016  . Herpes labialis 11/26/2015  . HIV disease ([place]) 09/03/2014  . Gastroparesis 08/30/2014  . Diabetic neuropathy ([place]) 08/30/2014  . Asthma 08/30/2014  . Tobacco abuse  08/17/2014  . Type 1 diabetes, uncontrolled, with gastroparesis ([place]) 09/01/2010  . Goiter, unspecified 09/01/2010  . Hypertension 09/01/2010    Past Surgical History:  Procedure Laterality Date  . EXCISIONAL HEMORRHOIDECTOMY    . FOOT SURGERY  1999   ingrown toenail removal        Home Medications    Prior to Admission medications   Medication Sig Start Date End Date Taking? Authorizing Provider  albuterol (PROVENTIL HFA;VENTOLIN HFA) 108 (90 Base) MCG/ACT inhaler Inhale 2 puffs into the lungs every 4 (four) hours as needed for wheezing or shortness of breath. 05/11/15   Pisciotta, Elmyra Ricks, PA-C  bictegravir-emtricitabine-tenofovir AF (BIKTARVY) 50-200-25 MG TABS tablet Take 1 tablet by mouth daily. 09/11/18   Michel Bickers, MD  Blood Glucose Monitoring Suppl ([place]) w/Device KIT 1 Device by Does not apply route 4 (four) times daily. 07/01/17   Philemon Kingdom, MD  glucose blood (ONETOUCH VERIO) test strip Use 4x a day 09/28/17   Philemon Kingdom, MD  ibuprofen (ADVIL,MOTRIN) 800 MG tablet Take 1 tablet (800 mg total) by mouth 3 (three) times daily. 02/18/16   Barry Dienes, NP  imiquimod [place]) 5 % cream Apply topically 3 (three) times a week. Prior to bedtime. Leave on skin 6-10 hours, remove with mild soap and water 01/28/17   Kuppelweiser, [name] L, RPH-CPP  insulin aspart (NOVOLOG FLEXPEN) 100 UNIT/ML FlexPen Use  up to 15 units 3x a day 07/07/18   Philemon Kingdom, MD  Insulin Degludec (TRESIBA FLEXTOUCH) 200 UNIT/ML SOPN Inject 26 Units into the skin daily. 07/07/18   Philemon Kingdom, MD  Lancets Glory Rosebush ULTRASOFT) lancets Use as instructed 07/01/17   Philemon Kingdom, MD  traMADol (ULTRAM) 50 MG tablet Take 1 tablet (50 mg total) by mouth every 6 (six) hours as needed. 02/18/16   Barry Dienes, NP    Family History Family History  Problem Relation Age of Onset  . Drug abuse Mother   . Heart disease Mother   . Alcohol abuse Father   . Diabetes Paternal  Aunt   . Arthritis Paternal Grandmother   . Stroke Paternal Grandfather   . Diabetes Paternal Grandfather     Social History Social History   Tobacco Use  . Smoking status: Current Every Day Smoker    Packs/day: 0.50    Types: Cigarettes  . Smokeless tobacco: Never Used  Substance Use Topics  . Alcohol use: No    Alcohol/week: 0.0 standard drinks  . Drug use: Yes    Frequency: 3.0 times per week    Types: Marijuana     Allergies   Patient has no known allergies.   Review of Systems Review of Systems Ten systems reviewed and are negative for acute change, except as noted in the HPI.    Physical Exam Updated Vital Signs BP (!) 139/93 (BP Location: Left Arm)   Pulse 77   Temp 98.4 F (36.9 C) (Oral)   Resp 15   Wt 99.3 kg   SpO2 100%   BMI 32.33 kg/m   Physical Exam Vitals signs and nursing note reviewed.  Constitutional:      General: He is not in acute distress.    Appearance: He is well-developed. He is not diaphoretic.  HENT:     Head: Normocephalic and atraumatic.  Eyes:     General: Lids are normal. No scleral icterus.       Right eye: No foreign body or discharge.        Left eye: No foreign body or discharge.     Extraocular Movements: Extraocular movements intact.     Conjunctiva/sclera:     Right eye: Right conjunctiva is injected. No chemosis or exudate.    Left eye: Left conjunctiva is injected. No chemosis or exudate.    Pupils:     Right eye: No fluorescein uptake.     Left eye: No fluorescein uptake.     Comments: Patient with pressure of 21 in the right eye and 23 in the left eye.  No fluorescein uptake on examination.  Extremely photophobic and unable to tolerate slit-lamp examination.  No obvious dendritic lesions.   Neck:     Musculoskeletal: Normal range of motion and neck supple.  Cardiovascular:     Rate and Rhythm: Normal rate and regular rhythm.     Heart sounds: Normal heart sounds.  Pulmonary:     Effort: Pulmonary effort is  normal. No respiratory distress.     Breath sounds: Normal breath sounds.  Abdominal:     Palpations: Abdomen is soft.     Tenderness: There is no abdominal tenderness.  Skin:    General: Skin is warm and dry.  Neurological:     Mental Status: He is alert.  Psychiatric:        Behavior: Behavior normal.      ED Treatments / Results  Labs (all labs ordered are listed, but  only abnormal results are displayed) Labs Reviewed - No data to display  EKG    Radiology No results found.  Procedures Procedures (including critical care time)  Medications Ordered in ED Medications - No data to display   Initial Impression / Assessment and Plan / ED Course  I have reviewed the triage vital signs and the nursing notes.  Pertinent labs & imaging results that were available during my care of the patient were reviewed by me and considered in my medical decision making (see chart for details).  Clinical Course as of Oct 31 1413  Wed Nov 01, 2018  1329  10/63 (pt. states they wear contacts. Did not have contacts with them to wear during screening. ) R Near: 10/80 L Near: 10/63   [AH]    Clinical Course User Index [AH] Margarita Mail, PA-C      23 y/o male with a pmh HIV, DM, and recent Syphilis infection. The emergent differential diagnosis for acute eye pain includes, but is not limited to ocular ischemia, optic neuritis, temporal arteritis, Sinusitis, neuralgia, Migraine, Acute angle closure glaucoma, eye trauma, uveitis, iritis, corneal abrasion/ulceration, and photokeratitis. I do not see obvious cause of his photophobia and the patient has recent syphilis infection along with consensual eye pain. I spoke with Dr. Talbert Forest who statest that he has low suspicion for ocular syphilis given recent treatment and feels this is more likely due to ill-fitting contacts or overwear syndrome. He asks that I send him directly to his clinic for further evaluation. I have discussed the plan with the  patient who will go directly.  Final Clinical Impressions(s) / ED Diagnoses   Final diagnoses:  None    ED Discharge Orders    None       Margarita Mail, PA-C 11/02/18 1244    Virgel Manifold, MD 11/02/18 1338

## 2018-11-01 NOTE — Telephone Encounter (Signed)
Well, that explains the no show. Thanks for checking on him.

## 2018-11-01 NOTE — ED Notes (Signed)
Pt verbalized understanding of discharge paperwork and follow-up care.  °

## 2018-11-01 NOTE — Telephone Encounter (Signed)
RN attempted to call patient to reschedule today's missed visit, noted he is in the ER. Landis Gandy, RN

## 2018-11-01 NOTE — ED Triage Notes (Signed)
Pt states he was driving this am, began to have light sensitivity in both eyes and watering in the left. C/o blurred vision in both eyes

## 2018-11-07 ENCOUNTER — Ambulatory Visit: Payer: Medicaid Other | Admitting: Pharmacist

## 2018-11-07 ENCOUNTER — Other Ambulatory Visit: Payer: Medicaid Other

## 2018-11-14 ENCOUNTER — Other Ambulatory Visit (HOSPITAL_COMMUNITY)
Admission: RE | Admit: 2018-11-14 | Discharge: 2018-11-14 | Disposition: A | Discharge status: Home / Self Care | Payer: Medicaid Other | Source: Ambulatory Visit | Attending: Family | Admitting: Family

## 2018-11-14 ENCOUNTER — Ambulatory Visit (INDEPENDENT_AMBULATORY_CARE_PROVIDER_SITE_OTHER): Payer: Self-pay | Admitting: Family

## 2018-11-14 ENCOUNTER — Other Ambulatory Visit: Payer: Self-pay

## 2018-11-14 ENCOUNTER — Encounter: Payer: Self-pay | Admitting: Family

## 2018-11-14 VITALS — BP 128/81 | HR 94 | Temp 98.9°F | Wt 229.0 lb

## 2018-11-14 DIAGNOSIS — K3184 Gastroparesis: Secondary | ICD-10-CM

## 2018-11-14 DIAGNOSIS — E1043 Type 1 diabetes mellitus with diabetic autonomic (poly)neuropathy: Secondary | ICD-10-CM

## 2018-11-14 DIAGNOSIS — IMO0002 Reserved for concepts with insufficient information to code with codable children: Secondary | ICD-10-CM

## 2018-11-14 DIAGNOSIS — B20 Human immunodeficiency virus [HIV] disease: Secondary | ICD-10-CM

## 2018-11-14 DIAGNOSIS — E1065 Type 1 diabetes mellitus with hyperglycemia: Secondary | ICD-10-CM

## 2018-11-14 DIAGNOSIS — I10 Essential (primary) hypertension: Secondary | ICD-10-CM

## 2018-11-14 DIAGNOSIS — Z113 Encounter for screening for infections with a predominantly sexual mode of transmission: Secondary | ICD-10-CM | POA: Insufficient documentation

## 2018-11-14 DIAGNOSIS — Z720 Tobacco use: Secondary | ICD-10-CM

## 2018-11-14 MED ORDER — BIKTARVY 50-200-25 MG PO TABS: 1.0000 | ORAL_TABLET | Freq: Every day | ORAL | 4 refills | Status: DC

## 2018-11-14 MED ORDER — LISINOPRIL 10 MG PO TABS: 10.0000 mg | ORAL_TABLET | Freq: Every day | ORAL | 2 refills | Status: DC

## 2018-11-14 NOTE — Assessment & Plan Note (Signed)
Continues to smoke tobacco at a rate of 3 cigarettes/day.  Discussed importance of tobacco cessation to reduce risk of cardiovascular, respiratory, malignant disease in the future.  In the precontemplation stage of quitting and not ready to quit at this time.  Continue to monitor.

## 2018-11-14 NOTE — Progress Notes (Signed)
Subjective:    Patient ID: Scott Baxter, male    DOB: 02/14/1996, 23 y.o.   MRN: [id number]  Chief Complaint  Patient presents with  . HIV Positive/AIDS     HPI:  Scott Baxter is a 23 y.o. male with HIV disease who was last seen in the office on 07/12/2017 for routine follow-up with good adherence and tolerance to his ART regimen of Biktarvy.  Viral load at the time was undetectable with CD4 count of 720.  Most recent blood work completed on 09/12/2018 with CD4 count of 654 and a viral load that remains undetectable.  Scott Baxter continues to take his Biktarvy as prescribed with no adverse side effects and has missed the last 3 days due to running out of medications.  Overall feels well today with no new concerns. Denies fevers, chills, night sweats, headaches, changes in vision, neck pain/stiffness, nausea, diarrhea, vomiting, lesions or rashes.  Scott Baxter receives financial assistance from [employer] and has no problems obtaining his medication from the pharmacy.  Due for renewal.  Denies feelings of being down, depressed, or hopeless recently.  Currently working reduced hours due to coronavirus pandemic.  Remains sexually active and uses condoms.  No new partners recently.  He does smoke marijuana on occasion and consumes alcohol socially.  He also smokes approximately 3 cigarettes/day.   No Known Allergies    Outpatient Medications Prior to Visit  Medication Sig Dispense Refill  . albuterol (PROVENTIL HFA;VENTOLIN HFA) 108 (90 Base) MCG/ACT inhaler Inhale 2 puffs into the lungs every 4 (four) hours as needed for wheezing or shortness of breath. 1 Inhaler 0  . Blood Glucose Monitoring Suppl (ONETOUCH VERIO FLEX SYSTEM) w/Device KIT 1 Device by Does not apply route 4 (four) times daily. 1 kit 0  . glucose blood (ONETOUCH VERIO) test strip Use 4x a day 300 each 5  . ibuprofen (ADVIL,MOTRIN) 800 MG tablet Take 1 tablet (800 mg total) by mouth 3 (three) times daily. 21 tablet 0  .  imiquimod (ALDARA) 5 % cream Apply topically 3 (three) times a week. Prior to bedtime. Leave on skin 6-10 hours, remove with mild soap and water 12 each 1  . insulin aspart (NOVOLOG FLEXPEN) 100 UNIT/ML FlexPen Use up to 15 units 3x a day 15 pen 3  . Insulin Degludec (TRESIBA FLEXTOUCH) 200 UNIT/ML SOPN Inject 26 Units into the skin daily. 9 pen 3  . Lancets (ONETOUCH ULTRASOFT) lancets Use as instructed 200 each 12  . traMADol (ULTRAM) 50 MG tablet Take 1 tablet (50 mg total) by mouth every 6 (six) hours as needed. 12 tablet 0  . bictegravir-emtricitabine-tenofovir AF (BIKTARVY) 50-200-25 MG TABS tablet Take 1 tablet by mouth daily. 30 tablet 0   No facility-administered medications prior to visit.      Past Medical History:  Diagnosis Date  . Asthma   . Depression 02/24/2017  . Diabetes mellitus   . HCAP (healthcare-associated pneumonia) 08/31/2014  . HIV disease ([place]) 09/03/2014  . Intrauterine drug exposure   . IUGR (intrauterine growth restriction)   . Obesity   . Proctitis   . Proctitis   . Recurrent boils    MRSA and MSSA  . Vision abnormalities      Past Surgical History:  Procedure Laterality Date  . EXCISIONAL HEMORRHOIDECTOMY    . FOOT SURGERY  1999   ingrown toenail removal    Review of Systems  Constitutional: Negative for appetite change, chills, fatigue, fever and unexpected weight change.  Eyes: Negative for visual disturbance.  Respiratory: Negative for cough, chest tightness, shortness of breath and wheezing.   Cardiovascular: Negative for chest pain and leg swelling.  Gastrointestinal: Negative for abdominal pain, constipation, diarrhea, nausea and vomiting.  Genitourinary: Negative for dysuria, flank pain, frequency, genital sores, hematuria and urgency.  Skin: Negative for rash.  Allergic/Immunologic: Negative for immunocompromised state.  Neurological: Negative for dizziness and headaches.      Objective:    BP 128/81   Pulse 94   Temp 98.9 F  (37.2 C)   Wt 229 lb (103.9 kg)   BMI 33.82 kg/m  Nursing note and vital signs reviewed.  Physical Exam Constitutional:      General: He is not in acute distress.    Appearance: He is well-developed.  Eyes:     Conjunctiva/sclera: Conjunctivae normal.  Neck:     Musculoskeletal: Neck supple.  Cardiovascular:     Rate and Rhythm: Normal rate and regular rhythm.     Heart sounds: Normal heart sounds. No murmur. No friction rub. No gallop.   Pulmonary:     Effort: Pulmonary effort is normal. No respiratory distress.     Breath sounds: Normal breath sounds. No wheezing or rales.  Chest:     Chest wall: No tenderness.  Abdominal:     General: Bowel sounds are normal.     Palpations: Abdomen is soft.     Tenderness: There is no abdominal tenderness.  Lymphadenopathy:     Cervical: No cervical adenopathy.  Skin:    General: Skin is warm and dry.     Findings: No rash.  Neurological:     Mental Status: He is alert and oriented to person, place, and time.  Psychiatric:        Behavior: Behavior normal.        Thought Content: Thought content normal.        Judgment: Judgment normal.        Assessment & Plan:   Problem List Items Addressed This Visit      Cardiovascular and Mediastinum   Hypertension    Blood pressure elevated above goal 140/90 and not currently on medications.  Previously on lisinopril.  Restart lisinopril.  Schedule nurse visit for 2 weeks to recheck blood pressure.  No signs/symptoms of headache, blurred vision, or changes in vision.  Continue to monitor.      Relevant Medications   lisinopril (ZESTRIL) 10 MG tablet     Digestive   Type 1 diabetes, uncontrolled, with gastroparesis (HCC)    No recent A1c available for review.  Discussed importance of maintaining blood sugars to reduce risk for cardiovascular and renal disease in the future as this is substantially increased with HIV disease.  Continue management per primary care.      Relevant  Medications   lisinopril (ZESTRIL) 10 MG tablet     Other   Tobacco abuse - Primary    Continues to smoke tobacco at a rate of 3 cigarettes/day.  Discussed importance of tobacco cessation to reduce risk of cardiovascular, respiratory, malignant disease in the future.  In the precontemplation stage of quitting and not ready to quit at this time.  Continue to monitor.      HIV disease [facility])    Scott Baxter has well-controlled HIV disease with good adherence and tolerance to his ART regimen of Biktarvy.  No signs/symptoms of opportunistic infection or progressive HIV disease.  Renew UMAP/HMAP today.  Continue current dose of Biktarvy.  Plan for  follow-up in 4 months or sooner if needed with lab work 1 to 2 weeks prior to appointment.      Relevant Medications   bictegravir-emtricitabine-tenofovir AF (BIKTARVY) 50-200-25 MG TABS tablet   Screening for STDs (sexually transmitted diseases)    At risk for sexually transmitted disease.  Discussed importance of continued condom usage.  Check gonorrhea/chlamydia swabs and urine today.      Relevant Orders   Cytology (oral, anal, urethral) ancillary only   Cytology (oral, anal, urethral) ancillary only   Urine cytology ancillary only([place])       I am having Daron Offer start on lisinopril. I am also having him maintain his albuterol, traMADol, ibuprofen, imiquimod, OneTouch Verio Flex System, onetouch ultrasoft, glucose blood, insulin aspart, Insulin Degludec, and Biktarvy.   Meds ordered this encounter  Medications  . bictegravir-emtricitabine-tenofovir AF (BIKTARVY) 50-200-25 MG TABS tablet    Sig: Take 1 tablet by mouth daily.    Dispense:  30 tablet    Refill:  4    Order Specific Question:   Supervising Provider    Answer:   Carlyle Basques [[id number]]  . lisinopril (ZESTRIL) 10 MG tablet    Sig: Take 1 tablet (10 mg total) by mouth daily.    Dispense:  30 tablet    Refill:  2    Order Specific Question:   Supervising  Provider    Answer:   Carlyle Basques [[id number]]     Follow-up: Return in about 4 months (around 03/17/2019), or if symptoms worsen or fail to improve.   Terri Piedra, MSN, FNP-C Nurse Practitioner [facility] [place] number: [phone]

## 2018-11-14 NOTE — Patient Instructions (Signed)
Nice to meet you!  Please continue take your [place] as prescribed daily.  Restart taking lisinopril to help with your blood pressure.  Please schedule a nurse visit to recheck blood pressure in 2 weeks.  Refills of medication sent to the pharmacy.  We will call with the results of your lab work.  Plan for follow-up in 4 months or sooner if needed.  Have a great day and stay safe!

## 2018-11-14 NOTE — Assessment & Plan Note (Signed)
Mr. Bynum has well-controlled HIV disease with good adherence and tolerance to his ART regimen of Biktarvy.  No signs/symptoms of opportunistic infection or progressive HIV disease.  Renew UMAP/HMAP today.  Continue current dose of Biktarvy.  Plan for follow-up in 4 months or sooner if needed with lab work 1 to 2 weeks prior to appointment.

## 2018-11-14 NOTE — Assessment & Plan Note (Signed)
No recent A1c available for review.  Discussed importance of maintaining blood sugars to reduce risk for cardiovascular and renal disease in the future as this is substantially increased with HIV disease.  Continue management per primary care.

## 2018-11-14 NOTE — Assessment & Plan Note (Signed)
At risk for sexually transmitted disease.  Discussed importance of continued condom usage.  Check gonorrhea/chlamydia swabs and urine today.

## 2018-11-14 NOTE — Assessment & Plan Note (Signed)
Blood pressure elevated above goal 140/90 and not currently on medications.  Previously on lisinopril.  Restart lisinopril.  Schedule nurse visit for 2 weeks to recheck blood pressure.  No signs/symptoms of headache, blurred vision, or changes in vision.  Continue to monitor.

## 2018-11-16 LAB — CYTOLOGY, (ORAL, ANAL, URETHRAL) ANCILLARY ONLY
Chlamydia: NEGATIVE
Chlamydia: NEGATIVE
Neisseria Gonorrhea: NEGATIVE
Neisseria Gonorrhea: NEGATIVE

## 2018-11-16 LAB — URINE CYTOLOGY ANCILLARY ONLY
Chlamydia: NEGATIVE
Neisseria Gonorrhea: NEGATIVE

## 2019-01-22 ENCOUNTER — Encounter: Payer: Self-pay | Admitting: Family

## 2019-03-07 ENCOUNTER — Other Ambulatory Visit: Payer: Self-pay | Admitting: Family

## 2019-03-07 DIAGNOSIS — I10 Essential (primary) hypertension: Secondary | ICD-10-CM

## 2019-03-13 ENCOUNTER — Ambulatory Visit: Payer: Medicaid Other | Admitting: Family

## 2019-03-28 ENCOUNTER — Encounter: Payer: Self-pay | Admitting: General Practice

## 2019-03-28 ENCOUNTER — Encounter: Payer: Medicaid Other | Admitting: Internal Medicine

## 2019-03-30 ENCOUNTER — Other Ambulatory Visit: Payer: Self-pay | Admitting: Family

## 2019-03-30 DIAGNOSIS — I10 Essential (primary) hypertension: Secondary | ICD-10-CM

## 2019-03-30 DIAGNOSIS — B20 Human immunodeficiency virus [HIV] disease: Secondary | ICD-10-CM

## 2019-04-14 DIAGNOSIS — B20 Human immunodeficiency virus [HIV] disease: Secondary | ICD-10-CM

## 2019-04-17 MED ORDER — BIKTARVY 50-200-25 MG PO TABS: 1.0000 | ORAL_TABLET | Freq: Every day | ORAL | 1 refills | Status: DC

## 2019-04-17 NOTE — Telephone Encounter (Signed)
RN called patient, updated demographics. He will contact [facility] to see if he can have 1 refill due to his circumstances. He will call [facility] and have his insulin transferred to [facility] for fill with ADAP program.  He will call [facility] for an appointment for primary care/diabetes management, as he cannot follow up with Dr Renne Crigler any more due to insurance.  He will let us know if there is any issue with the biktarvy fill. RN sent new prescription. He is due for visit, will connect him to [facility]'s transportation program due to his need. Landis Gandy, RN

## 2019-04-23 ENCOUNTER — Other Ambulatory Visit: Payer: Self-pay | Admitting: Family

## 2019-04-23 DIAGNOSIS — I10 Essential (primary) hypertension: Secondary | ICD-10-CM

## 2019-04-25 ENCOUNTER — Other Ambulatory Visit (HOSPITAL_COMMUNITY)
Admission: RE | Admit: 2019-04-25 | Discharge: 2019-04-25 | Disposition: A | Discharge status: Home / Self Care | Payer: Medicaid Other | Source: Ambulatory Visit | Attending: Family | Admitting: Family

## 2019-04-25 ENCOUNTER — Other Ambulatory Visit: Payer: Self-pay

## 2019-04-25 ENCOUNTER — Other Ambulatory Visit: Payer: Self-pay | Admitting: *Deleted

## 2019-04-25 ENCOUNTER — Other Ambulatory Visit: Payer: Medicaid Other

## 2019-04-25 DIAGNOSIS — Z113 Encounter for screening for infections with a predominantly sexual mode of transmission: Secondary | ICD-10-CM | POA: Insufficient documentation

## 2019-04-25 DIAGNOSIS — B20 Human immunodeficiency virus [HIV] disease: Secondary | ICD-10-CM

## 2019-04-25 DIAGNOSIS — Z79899 Other long term (current) drug therapy: Secondary | ICD-10-CM

## 2019-04-26 LAB — T-HELPER CELL (CD4) - (RCID CLINIC ONLY)
CD4 % Helper T Cell: 31 % — ABNORMAL LOW (ref 33–65)
CD4 T Cell Abs: 571 /uL (ref 400–1790)

## 2019-04-26 LAB — URINE CYTOLOGY ANCILLARY ONLY
Chlamydia: NEGATIVE
Comment: NEGATIVE
Comment: NORMAL
Neisseria Gonorrhea: NEGATIVE

## 2019-04-30 ENCOUNTER — Other Ambulatory Visit: Payer: Self-pay | Admitting: Family

## 2019-04-30 DIAGNOSIS — B20 Human immunodeficiency virus [HIV] disease: Secondary | ICD-10-CM

## 2019-05-05 LAB — LIPID PANEL
Cholesterol: 163 mg/dL (ref <200)
HDL: 62 mg/dL (ref >=40)
LDL Cholesterol (Calc): 84 mg/dL (calc)
Non-HDL Cholesterol (Calc): 101 mg/dL (calc) (ref <130)
Total CHOL/HDL Ratio: 2.6 (calc) (ref <5.0)
Triglycerides: 83 mg/dL (ref <150)

## 2019-05-05 LAB — COMPLETE METABOLIC PANEL WITH GFR
AG Ratio: 1.6 (calc) (ref 1.0–2.5)
ALT: 15 U/L (ref 9–46)
AST: 17 U/L (ref 10–40)
Albumin: 4.6 g/dL (ref 3.6–5.1)
Alkaline phosphatase (APISO): 70 U/L (ref 36–130)
BUN: 12 mg/dL (ref 7–25)
CO2: 25 mmol/L (ref 20–32)
Calcium: 10 mg/dL (ref 8.6–10.3)
Chloride: 100 mmol/L (ref 98–110)
Creat: 0.99 mg/dL (ref 0.60–1.35)
GFR, Est African American: 124 mL/min/{1.73_m2} (ref >=60)
GFR, Est Non African American: 107 mL/min/{1.73_m2} (ref >=60)
Globulin: 2.8 g/dL (calc) (ref 1.9–3.7)
Glucose, Bld: 253 mg/dL — ABNORMAL HIGH (ref 65–99)
Potassium: 4.6 mmol/L (ref 3.5–5.3)
Sodium: 135 mmol/L (ref 135–146)
Total Bilirubin: 0.4 mg/dL (ref 0.2–1.2)
Total Protein: 7.4 g/dL (ref 6.1–8.1)

## 2019-05-05 LAB — HIV-1 RNA QUANT-NO REFLEX-BLD
HIV 1 RNA Quant: <20 copies/mL — AB
HIV-1 RNA Quant, Log: <1.3 Log copies/mL — AB

## 2019-05-05 LAB — CBC WITH DIFFERENTIAL/PLATELET
Absolute Monocytes: 476 cells/uL (ref 200–950)
Basophils Absolute: 29 cells/uL (ref 0–200)
Basophils Relative: 0.5 %
Eosinophils Absolute: 29 cells/uL (ref 15–500)
Eosinophils Relative: 0.5 %
HCT: 48.7 % (ref 38.5–50.0)
Hemoglobin: 16.2 g/dL (ref 13.2–17.1)
Lymphs Abs: 2047 cells/uL (ref 850–3900)
MCH: 30.3 pg (ref 27.0–33.0)
MCHC: 33.3 g/dL (ref 32.0–36.0)
MCV: 91.2 fL (ref 80.0–100.0)
MPV: 12.5 fL (ref 7.5–12.5)
Monocytes Relative: 8.2 %
Neutro Abs: 3219 cells/uL (ref 1500–7800)
Neutrophils Relative %: 55.5 %
Platelets: 198 10*3/uL (ref 140–400)
RBC: 5.34 10*6/uL (ref 4.20–5.80)
RDW: 13 % (ref 11.0–15.0)
Total Lymphocyte: 35.3 %
WBC: 5.8 10*3/uL (ref 3.8–10.8)

## 2019-05-05 LAB — RPR: RPR Ser Ql: REACTIVE — AB

## 2019-05-05 LAB — RPR TITER: RPR Titer: 1:1 {titer} — ABNORMAL HIGH

## 2019-05-05 LAB — FLUORESCENT TREPONEMAL AB(FTA)-IGG-BLD: Fluorescent Treponemal ABS: REACTIVE — AB

## 2019-05-09 ENCOUNTER — Encounter: Payer: Medicaid Other | Admitting: Family

## 2019-05-14 ENCOUNTER — Ambulatory Visit (INDEPENDENT_AMBULATORY_CARE_PROVIDER_SITE_OTHER): Payer: Self-pay | Admitting: Family

## 2019-05-14 ENCOUNTER — Other Ambulatory Visit: Payer: Self-pay

## 2019-05-14 ENCOUNTER — Encounter: Payer: Self-pay | Admitting: Family

## 2019-05-14 VITALS — Ht 69.0 in | Wt 219.0 lb

## 2019-05-14 DIAGNOSIS — Z113 Encounter for screening for infections with a predominantly sexual mode of transmission: Secondary | ICD-10-CM

## 2019-05-14 DIAGNOSIS — E1043 Type 1 diabetes mellitus with diabetic autonomic (poly)neuropathy: Secondary | ICD-10-CM

## 2019-05-14 DIAGNOSIS — IMO0002 Reserved for concepts with insufficient information to code with codable children: Secondary | ICD-10-CM

## 2019-05-14 DIAGNOSIS — E1065 Type 1 diabetes mellitus with hyperglycemia: Secondary | ICD-10-CM

## 2019-05-14 DIAGNOSIS — B20 Human immunodeficiency virus [HIV] disease: Secondary | ICD-10-CM

## 2019-05-14 DIAGNOSIS — Z0000 Encounter for general adult medical examination without abnormal findings: Secondary | ICD-10-CM

## 2019-05-14 DIAGNOSIS — Z23 Encounter for immunization: Secondary | ICD-10-CM

## 2019-05-14 DIAGNOSIS — K3184 Gastroparesis: Secondary | ICD-10-CM

## 2019-05-14 MED ORDER — BIKTARVY 50-200-25 MG PO TABS: 1.0000 | ORAL_TABLET | Freq: Every day | ORAL | 3 refills | Status: DC

## 2019-05-14 MED ORDER — NOVOLOG FLEXPEN 100 UNIT/ML ~~LOC~~ SOPN: PEN_INJECTOR | SUBCUTANEOUS | 3 refills | Status: DC

## 2019-05-14 MED ORDER — LEVEMIR FLEXTOUCH 100 UNIT/ML ~~LOC~~ SOPN: 20.0000 [IU] | PEN_INJECTOR | Freq: Every day | SUBCUTANEOUS | 1 refills | Status: DC

## 2019-05-14 NOTE — Assessment & Plan Note (Signed)
Mr. Krist continues to have well-controlled HIV disease with good adherence and tolerance to his ART regimen of Biktarvy.  No signs/symptoms of opportunistic infection or progressive HIV disease.  We reviewed his lab work and discussed the plan of care.  He will renew his financial assistance today.  Continue current dose of Biktarvy.  Plan for follow-up in 1 month or sooner if needed for diabetes.
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
## 2019-05-14 NOTE — Assessment & Plan Note (Signed)
   Prevnar and influenza updated today.  Discussed importance of safe sexual practice to reduce risk of STI.  Oral/rectal swabs obtained.  Condoms provided.  Refer to [facility] dental clinic for routine care.

## 2019-05-14 NOTE — Addendum Note (Signed)
Addended by: Valarie Cones on: 05/14/2019 05:04 PM   Modules accepted: Orders

## 2019-05-14 NOTE — Patient Instructions (Signed)
Nice to see you.  Please continue take your Biktarvy as prescribed.  Prescription for insulin has been sent to the pharmacy with the change of Tresiba to Levemir and continue with NovoLog.  Plan for follow-up in 1 month or sooner if needed with lab work 1 week before your appointment to check A1c.  A referral to endocrinology has been placed for diabetes management.  Please keep your primary care appointment.  Please let me know if you have any questions/concerns.  Have a great day and stay safe.  Happy new year!

## 2019-05-14 NOTE — Assessment & Plan Note (Signed)
Mr. Kegley likely has poorly controlled type 1 diabetes currently maintained on Tresiba and NovoLog.  Will change Tresiba to Levemir as it is covered on the financial assistance program through the state and continue current dose of NovoLog.  Referral to endocrinology placed for further treatment.  We will follow-up in 1 month or sooner if needed to recheck A1c in for further diabetes management pending referral to endocrinology.

## 2019-05-15 ENCOUNTER — Encounter: Payer: Self-pay | Admitting: Family

## 2019-05-15 LAB — CYTOLOGY, (ORAL, ANAL, URETHRAL) ANCILLARY ONLY
Chlamydia: NEGATIVE
Chlamydia: NEGATIVE
Comment: NEGATIVE
Comment: NEGATIVE
Comment: NORMAL
Comment: NORMAL
Neisseria Gonorrhea: NEGATIVE
Neisseria Gonorrhea: POSITIVE — AB

## 2019-05-16 ENCOUNTER — Ambulatory Visit (INDEPENDENT_AMBULATORY_CARE_PROVIDER_SITE_OTHER): Payer: Self-pay | Admitting: Internal Medicine

## 2019-05-16 ENCOUNTER — Encounter: Payer: Self-pay | Admitting: Internal Medicine

## 2019-05-16 VITALS — BP 136/90 | HR 104 | Wt 215.4 lb

## 2019-05-16 DIAGNOSIS — E1043 Type 1 diabetes mellitus with diabetic autonomic (poly)neuropathy: Secondary | ICD-10-CM

## 2019-05-16 DIAGNOSIS — Z79899 Other long term (current) drug therapy: Secondary | ICD-10-CM

## 2019-05-16 DIAGNOSIS — K3184 Gastroparesis: Secondary | ICD-10-CM

## 2019-05-16 DIAGNOSIS — Z794 Long term (current) use of insulin: Secondary | ICD-10-CM

## 2019-05-16 DIAGNOSIS — IMO0002 Reserved for concepts with insufficient information to code with codable children: Secondary | ICD-10-CM

## 2019-05-16 DIAGNOSIS — I10 Essential (primary) hypertension: Secondary | ICD-10-CM

## 2019-05-16 DIAGNOSIS — E1065 Type 1 diabetes mellitus with hyperglycemia: Secondary | ICD-10-CM

## 2019-05-16 LAB — GLUCOSE, CAPILLARY: Glucose-Capillary: 245 mg/dL — ABNORMAL HIGH (ref 70–99)

## 2019-05-16 LAB — POCT GLYCOSYLATED HEMOGLOBIN (HGB A1C): Hemoglobin A1C: 10 % — AB (ref 4.0–5.6)

## 2019-05-16 MED ORDER — LANCETS MISC: 1.0000 [IU] | Freq: Three times a day (TID) | 11 refills | Status: DC

## 2019-05-16 MED ORDER — CONTOUR NEXT TEST VI STRP: ORAL_STRIP | 3 refills | Status: DC

## 2019-05-16 MED ORDER — PEN NEEDLES 31G X 5 MM MISC: 1.0000 [IU] | Freq: Three times a day (TID) | 11 refills | Status: DC

## 2019-05-16 MED ORDER — LANTUS SOLOSTAR 100 UNIT/ML ~~LOC~~ SOPN: 20.0000 [IU] | PEN_INJECTOR | Freq: Every day | SUBCUTANEOUS | 3 refills | Status: DC

## 2019-05-16 NOTE — Patient Instructions (Addendum)
Thank you for allowing [place] to provide your care. I have sent out a prescription for your Lantus along with meter supplies. Please pick them up and take them as prescribed. I would like you to come back in four weeks with your blood glucose monitor. This way we can make adjustments to your insulin.  Please schedule an appointment for diabetic eye exam.

## 2019-05-16 NOTE — Assessment & Plan Note (Signed)
Patient diagnosed with type I diabetes at age 24. He was previously well controlled on Lantus 20 units QHS and novolog 15 units TID WC (SSI). He has been out of his Lantus of the past several months due to lapse in insurance. He tires to limit his carb intake and carb counts his meals. He does not exercise. He has been checking his sugars sporadically. He states that since being off his Lantus his blood sugars have typically been between 2 to 300. Previous to this his sugars averaged around 170.  A/P: - Uncontrolled with A1c of 10  - Given CBG meter today  - Start Lantus 20 units QHS  - Continue Novolog 15 units TID WC (SSI) - Continue Lisinopril 10 mg QD  - Needs eye exam  - Foot exam performed.

## 2019-05-16 NOTE — Assessment & Plan Note (Signed)
Patient presents for valuation and management of his hypertension. He is currently on lisinopril 10 mg once daily. He is taking this consistently. He denies side effects from this medication. He denies orthostatic symptoms.  Reviewed his most recent BMP with normal potassium and renal function.  A/P: - Controlled  - Continue Lisinopril 10 mg QD

## 2019-05-16 NOTE — Progress Notes (Signed)
   CC: DM and HTN  HPI:  [name] is a 24 y.o. male with PMHx listed below presenting for DM and HTN. Please see the A&P for the status of the patient's chronic medical problems.  Past Medical History:  Diagnosis Date  . Asthma   . Depression 02/24/2017  . Diabetes mellitus   . HCAP (healthcare-associated pneumonia) 08/31/2014  . HIV disease (H[facility]) 09/03/2014  . Intrauterine drug exposure   . IUGR (intrauterine growth restriction)   . Obesity   . Proctitis   . Proctitis   . Recurrent boils    MRSA and MSSA  . Vision abnormalities    Surgical History: No surgical history  Family History: No family history of DM, HTN, CAD, or CVA. Parents used narcotics.   Social History: Originally from [place]. Help with his grandmother. Has nine siblings that did not grow up with him. 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 Does occasionally drink alcohol. Does not smoke cigarettes. Does occasionally use marijuana.  Review of Systems: Performed and all others negative.  Physical Exam: Vitals:   05/16/19 1034  BP: 136/90  Pulse: (!) 104  SpO2: 99%  Weight: 215 lb 6.4 oz (97.7 kg)   General: Well nourished male in no acute distress HENT: Normocephalic, atraumatic, moist mucus membranes Pulm: Good air movement with no wheezing or crackles  CV: RRR, no murmurs, no rubs  Abdomen: Active bowel sounds, soft, non-distended, no tenderness to palpation  Extremities: Pulses palpable in all extremities, no LE edema  Skin: Warm and dry  Neuro: Alert and oriented x 3  Assessment & Plan:   See Encounters Tab for problem based charting.  Patient discussed with Dr. Oswaldo Done

## 2019-05-17 NOTE — Progress Notes (Signed)
Internal Medicine Clinic Attending  Case discussed with Dr. Helberg at the time of the visit.  We reviewed the resident's history and exam and pertinent patient test results.  I agree with the assessment, diagnosis, and plan of care documented in the resident's note.    

## 2019-05-21 ENCOUNTER — Telehealth: Payer: Self-pay

## 2019-05-21 NOTE — Telephone Encounter (Signed)
Attempted second call to patient to provide test results and schedule treatment. Unable to leave voicemail as phone just continued to ring. Tammy Sours, NP has reached out via MyChart as well.   Adell Panek Loyola Mast, RN

## 2019-05-23 NOTE — Telephone Encounter (Signed)
RN left message asking patient to call his doctor's office to follow up on results. Andree Coss, RN

## 2019-05-27 ENCOUNTER — Other Ambulatory Visit: Payer: Self-pay | Admitting: Family

## 2019-05-27 DIAGNOSIS — I10 Essential (primary) hypertension: Secondary | ICD-10-CM

## 2019-05-30 ENCOUNTER — Ambulatory Visit: Payer: Medicaid Other

## 2019-05-30 ENCOUNTER — Encounter: Payer: Self-pay | Admitting: Internal Medicine

## 2019-06-01 NOTE — Telephone Encounter (Signed)
Called patient and left VM to call the office to follow up on lab results. Also sent patient a MyChart message. Valarie Cones

## 2019-06-05 ENCOUNTER — Other Ambulatory Visit: Payer: Self-pay

## 2019-06-24 ENCOUNTER — Other Ambulatory Visit: Payer: Self-pay | Admitting: Family

## 2019-06-24 DIAGNOSIS — I10 Essential (primary) hypertension: Secondary | ICD-10-CM

## 2019-06-25 ENCOUNTER — Other Ambulatory Visit: Payer: Medicaid Other

## 2019-07-03 ENCOUNTER — Other Ambulatory Visit: Payer: Self-pay

## 2019-07-03 ENCOUNTER — Encounter: Payer: Self-pay | Admitting: Family

## 2019-07-03 ENCOUNTER — Ambulatory Visit (INDEPENDENT_AMBULATORY_CARE_PROVIDER_SITE_OTHER): Payer: Self-pay | Admitting: Family

## 2019-07-03 VITALS — BP 157/94 | HR 83 | Temp 98.3°F | Ht 70.0 in | Wt 223.0 lb

## 2019-07-03 DIAGNOSIS — E1065 Type 1 diabetes mellitus with hyperglycemia: Secondary | ICD-10-CM

## 2019-07-03 DIAGNOSIS — Z0000 Encounter for general adult medical examination without abnormal findings: Secondary | ICD-10-CM

## 2019-07-03 DIAGNOSIS — K3184 Gastroparesis: Secondary | ICD-10-CM

## 2019-07-03 DIAGNOSIS — IMO0002 Reserved for concepts with insufficient information to code with codable children: Secondary | ICD-10-CM

## 2019-07-03 DIAGNOSIS — E1043 Type 1 diabetes mellitus with diabetic autonomic (poly)neuropathy: Secondary | ICD-10-CM

## 2019-07-03 DIAGNOSIS — Z113 Encounter for screening for infections with a predominantly sexual mode of transmission: Secondary | ICD-10-CM

## 2019-07-03 DIAGNOSIS — B20 Human immunodeficiency virus [HIV] disease: Secondary | ICD-10-CM

## 2019-07-03 MED ORDER — BIKTARVY 50-200-25 MG PO TABS: 1.0000 | ORAL_TABLET | Freq: Every day | ORAL | 3 refills | Status: DC

## 2019-07-03 NOTE — Patient Instructions (Signed)
Nice to see you.  We will continue your Biktarvy.  We will check your blood work today.  Follow up with Dr. Mardi Mainland regarding your cough and lisinopril.   Plan for follow up in 3 months or sooner if needed with video or in person visit and lab work 1-2 weeks prior to appointment.  Good luck with the job search.  Have a great day and stay safe!

## 2019-07-03 NOTE — Assessment & Plan Note (Signed)
Scott Baxter continues to have well-controlled HIV disease with good adherence and tolerance to his ART regimen Biktarvy.  No signs/symptoms of opportunistic infection or progressive HIV disease.  We reviewed his previous lab work and discussed the plan of care.  He has financial assistance is up-to-date and covered through September.  Check blood work today.  Continue current dose of Biktarvy.  Plan for follow-up in 3 months or sooner if needed with lab work 1 to 2 weeks prior to appointment or on same day.

## 2019-07-03 NOTE — Assessment & Plan Note (Signed)
   Discussed importance of safe sexual practice to reduce risk of STI.  Dental screening up-to-date per recommendations.

## 2019-07-03 NOTE — Progress Notes (Signed)
Subjective:    Patient ID: [name], male    DOB: 06-Feb-1996, 24 y.o.   MRN: [id number]  Chief Complaint  Patient presents with  . Follow-up     HPI:  Scott Baxter is a 24 y.o. male with HIV disease who was last seen in the office on 05/13/2018 with good adherence and tolerance to his ART regimen of Biktarvy.  Blood work at that time showed a viral load that with tractable and CD4 count of 571.  Glucose was significantly elevated at 253.  Most recent blood work completed on 05/16/2019 with a hemoglobin A1c of 10.  [name] continues to take his Biktarvy as prescribed with no adverse side effects or missed doses since his last office visit.  Overall feeling well today with no new concerns/complaints. Denies fevers, chills, night sweats, headaches, changes in vision, neck pain/stiffness, nausea, diarrhea, vomiting, lesions or rashes.  [name] has no problems obtaining his medication from the pharmacy and continues to receive financial assistance through [employer].  Feelings of being down have improved recently and denies any suicidal ideations or hallucinations.  He does smoke marijuana daily and tobacco at a rate of about 1/4 pack/day on average.  Alcohol is social.  Not currently sexually active.  He is currently unemployed and seeking work with a potential of a few leads.  Dental visit is up-to-date per recommendations.   No Known Allergies    Outpatient Medications Prior to Visit  Medication Sig Dispense Refill  . albuterol (PROVENTIL HFA;VENTOLIN HFA) 108 (90 Base) MCG/ACT inhaler Inhale 2 puffs into the lungs every 4 (four) hours as needed for wheezing or shortness of breath. 1 Inhaler 0  . glucose blood (CONTOUR NEXT TEST) test strip Use as instructed 300 each 3  . ibuprofen (ADVIL,MOTRIN) 800 MG tablet Take 1 tablet (800 mg total) by mouth 3 (three) times daily. 21 tablet 0  . insulin aspart (NOVOLOG FLEXPEN) 100 UNIT/ML FlexPen Use up to 15 units 3x a day 15 pen 3  .  Insulin Glargine (LANTUS SOLOSTAR) 100 UNIT/ML Solostar Pen Inject 20 Units into the skin daily at 10 pm. 5 pen 3  . Insulin Pen Needle (PEN NEEDLES) 31G X 5 MM MISC 1 Units by Does not apply route 4 (four) times daily -  before meals and at bedtime. 100 each 11  . Lancets MISC 1 Units by Does not apply route 4 (four) times daily -  before meals and at bedtime. 200 each 11  . lisinopril (ZESTRIL) 10 MG tablet TAKE 1 TABLET(10 MG) BY MOUTH DAILY 30 tablet 0  . bictegravir-emtricitabine-tenofovir AF (BIKTARVY) 50-200-25 MG TABS tablet Take 1 tablet by mouth daily. 30 tablet 3  . imiquimod (ALDARA) 5 % cream Apply topically 3 (three) times a week. Prior to bedtime. Leave on skin 6-10 hours, remove with mild soap and water 12 each 1   No facility-administered medications prior to visit.     Past Medical History:  Diagnosis Date  . Asthma   . Depression 02/24/2017  . Diabetes mellitus   . HCAP (healthcare-associated pneumonia) 08/31/2014  . HIV disease ([place]) 09/03/2014  . Intrauterine drug exposure   . IUGR (intrauterine growth restriction)   . Obesity   . Proctitis   . Proctitis   . Recurrent boils    MRSA and MSSA  . Vision abnormalities      Past Surgical History:  Procedure Laterality Date  . EXCISIONAL HEMORRHOIDECTOMY    . FOOT SURGERY  1999   ingrown toenail removal       Review of Systems  Constitutional: Negative for chills, diaphoresis, fatigue and fever.  Respiratory: Negative for cough, chest tightness, shortness of breath and wheezing.   Cardiovascular: Negative for chest pain.  Gastrointestinal: Negative for abdominal pain, diarrhea, nausea and vomiting.      Objective:    BP (!) 157/94   Pulse 83   Temp 98.3 F (36.8 C)   Ht 5\' 10"  (1.778 m)   Wt 223 lb (101.2 kg)   SpO2 100%   BMI 32.00 kg/m  Nursing note and vital signs reviewed.  Physical Exam Constitutional:      General: He is not in acute distress.    Appearance: He is well-developed.    Cardiovascular:     Rate and Rhythm: Normal rate and regular rhythm.     Heart sounds: Normal heart sounds.  Pulmonary:     Effort: Pulmonary effort is normal.     Breath sounds: Normal breath sounds.  Skin:    General: Skin is warm and dry.  Neurological:     Mental Status: He is alert and oriented to person, place, and time.  Psychiatric:        Behavior: Behavior normal.        Thought Content: Thought content normal.        Judgment: Judgment normal.      Depression screen [facility] 2/9 05/14/2019 07/12/2017 11/26/2015 12/05/2014 09/24/2014  Decreased Interest 0 0 0 0 0  Down, Depressed, Hopeless 0 0 0 0 0  PHQ - 2 Score 0 0 0 0 0       Assessment & Plan:    Patient Active Problem List   Diagnosis Date Noted  . Healthcare maintenance 05/14/2019  . Screening for STDs (sexually transmitted diseases) 11/14/2018  . Depression 02/24/2017  . Eczema 02/24/2017  . Chlamydia infection 10/29/2016  . Herpes labialis 11/26/2015  . HIV disease ([facility]) 09/03/2014  . Gastroparesis 08/30/2014  . Diabetic neuropathy ([facility]) 08/30/2014  . Asthma 08/30/2014  . Tobacco abuse 08/17/2014  . Type 1 diabetes, uncontrolled, 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/01/2010  . Goiter, unspecified 09/01/2010  . Hypertension 09/01/2010     Problem List Items Addressed This Visit      Digestive   Type 1 diabetes, uncontrolled, with gastroparesis [facility])    Mr. Saini continues to have poorly controlled type 1 diabetes with most recent A1c of 10.0.  Continue management with insulin and adjustments per primary care recommendations.        Other   HIV disease ([facility]) - Primary    Mr. Weider continues to have well-controlled HIV disease with good adherence and tolerance to his ART regimen Biktarvy.  No signs/symptoms of opportunistic infection or progressive HIV disease.  We reviewed his previous lab work and discussed the plan of care.  He has financial assistance is up-to-date and covered through September.  Check blood  work today.  Continue current dose of Biktarvy.  Plan for follow-up in 3 months or sooner if needed with lab work 1 to 2 weeks prior to appointment or on same day.      Relevant Medications   bictegravir-emtricitabine-tenofovir AF (BIKTARVY) 50-200-25 MG TABS tablet   Other Relevant Orders   HIV-1 RNA quant-no reflex-bld   T-helper cell (CD4)- ([facility] clinic only)   COMPLETE METABOLIC PANEL WITH GFR   HIV-1 RNA quant-no reflex-bld   CBC   Comprehensive metabolic panel   Screening for STDs (sexually transmitted  diseases)   Relevant Orders   RPR   Urine cytology ancillary only([place])   Healthcare maintenance     Discussed importance of safe sexual practice to reduce risk of STI.  Dental screening up-to-date per recommendations.       Other Visit Diagnoses    Human immunodeficiency virus (HIV) disease (HCC)       Relevant Medications   bictegravir-emtricitabine-tenofovir AF (BIKTARVY) 50-200-25 MG TABS tablet       I have discontinued Fritzi Mandes R. Kwolek's imiquimod. I am also having him maintain his albuterol, ibuprofen, NovoLOG FlexPen, Contour Next Test, Lantus SoloStar, Pen Needles, Lancets, lisinopril, and Biktarvy.   Meds ordered this encounter  Medications  . bictegravir-emtricitabine-tenofovir AF (BIKTARVY) 50-200-25 MG TABS tablet    Sig: Take 1 tablet by mouth daily.    Dispense:  30 tablet    Refill:  3    Order Specific Question:   Supervising Provider    Answer:   Judyann Munson [[id number]]     Follow-up: Return in about 3 months (around 09/30/2019).   Marcos Eke, MSN, FNP-C Nurse Practitioner [facility] for Infectious Disease [facility] number: [phone]

## 2019-07-03 NOTE — Assessment & Plan Note (Signed)
Scott Baxter continues to have poorly controlled type 1 diabetes with most recent A1c of 10.0.  Continue management with insulin and adjustments per primary care recommendations.

## 2019-07-04 LAB — T-HELPER CELL (CD4) - (RCID CLINIC ONLY)
CD4 % Helper T Cell: 35 % (ref 33–65)
CD4 T Cell Abs: 473 /uL (ref 400–1790)

## 2019-07-07 LAB — COMPLETE METABOLIC PANEL WITH GFR
AG Ratio: 1.6 (calc) (ref 1.0–2.5)
ALT: 19 U/L (ref 9–46)
AST: 15 U/L (ref 10–40)
Albumin: 4.4 g/dL (ref 3.6–5.1)
Alkaline phosphatase (APISO): 63 U/L (ref 36–130)
BUN: 12 mg/dL (ref 7–25)
CO2: 26 mmol/L (ref 20–32)
Calcium: 9.4 mg/dL (ref 8.6–10.3)
Chloride: 101 mmol/L (ref 98–110)
Creat: 1.07 mg/dL (ref 0.60–1.35)
GFR, Est African American: 113 mL/min/{1.73_m2} (ref >=60)
GFR, Est Non African American: 97 mL/min/{1.73_m2} (ref >=60)
Globulin: 2.8 g/dL (calc) (ref 1.9–3.7)
Glucose, Bld: 302 mg/dL — ABNORMAL HIGH (ref 65–99)
Potassium: 5.1 mmol/L (ref 3.5–5.3)
Sodium: 135 mmol/L (ref 135–146)
Total Bilirubin: 0.9 mg/dL (ref 0.2–1.2)
Total Protein: 7.2 g/dL (ref 6.1–8.1)

## 2019-07-07 LAB — HIV-1 RNA QUANT-NO REFLEX-BLD
HIV 1 RNA Quant: 16600 copies/mL — ABNORMAL HIGH
HIV-1 RNA Quant, Log: 4.22 Log copies/mL — ABNORMAL HIGH

## 2019-07-26 ENCOUNTER — Other Ambulatory Visit: Payer: Self-pay | Admitting: Family

## 2019-07-26 DIAGNOSIS — I10 Essential (primary) hypertension: Secondary | ICD-10-CM

## 2019-09-03 ENCOUNTER — Other Ambulatory Visit: Payer: Medicaid Other

## 2019-09-24 ENCOUNTER — Other Ambulatory Visit: Payer: Self-pay

## 2019-09-24 ENCOUNTER — Encounter (INDEPENDENT_AMBULATORY_CARE_PROVIDER_SITE_OTHER): Payer: Medicaid Other | Admitting: Family

## 2019-09-24 NOTE — Progress Notes (Signed)
Subjective:    Patient ID: Scott Baxter, male    DOB: [birth year], 24 y.o.   MRN: [id number]  No chief complaint on file.    HPI:   Virtual Visit via Telephone Note   I connected with on @TD @ at by telephone and verified that I am speaking with the correct person using two identifiers.   I discussed the limitations, risks, security and privacy concerns of performing an evaluation and management service by telephone and the availability of in person appointments. I also discussed with the patient that there may be a patient responsible charge related to this service. The patient expressed understanding and agreed to proceed.   Scott Baxter is a 24 y.o. male    No Known Allergies    Outpatient Medications Prior to Visit  Medication Sig Dispense Refill  . albuterol (PROVENTIL HFA;VENTOLIN HFA) 108 (90 Base) MCG/ACT inhaler Inhale 2 puffs into the lungs every 4 (four) hours as needed for wheezing or shortness of breath. 1 Inhaler 0  . bictegravir-emtricitabine-tenofovir AF (BIKTARVY) 50-200-25 MG TABS tablet Take 1 tablet by mouth daily. 30 tablet 3  . glucose blood (CONTOUR NEXT TEST) test strip Use as instructed 300 each 3  . ibuprofen (ADVIL,MOTRIN) 800 MG tablet Take 1 tablet (800 mg total) by mouth 3 (three) times daily. 21 tablet 0  . insulin aspart (NOVOLOG FLEXPEN) 100 UNIT/ML FlexPen Use up to 15 units 3x a day 15 pen 3  . Insulin Glargine (LANTUS SOLOSTAR) 100 UNIT/ML Solostar Pen Inject 20 Units into the skin daily at 10 pm. 5 pen 3  . Insulin Pen Needle (PEN NEEDLES) 31G X 5 MM MISC 1 Units by Does not apply route 4 (four) times daily -  before meals and at bedtime. 100 each 11  . Lancets MISC 1 Units by Does not apply route 4 (four) times daily -  before meals and at bedtime. 200 each 11  . lisinopril (ZESTRIL) 10 MG tablet TAKE 1 TABLET(10 MG) BY MOUTH DAILY 30 tablet 0   No facility-administered medications prior to visit.     Past Medical History:    Diagnosis Date  . Asthma   . Depression 02/24/2017  . Diabetes mellitus   . HCAP (healthcare-associated pneumonia) 08/31/2014  . HIV disease (HCC) 09/03/2014  . Intrauterine drug exposure   . IUGR (intrauterine growth restriction)   . Obesity   . Proctitis   . Proctitis   . Recurrent boils    MRSA and MSSA  . Vision abnormalities      Past Surgical History:  Procedure Laterality Date  . EXCISIONAL HEMORRHOIDECTOMY    . FOOT SURGERY  1999   ingrown toenail removal       Review of Systems    Objective:    There were no vitals taken for this visit. Nursing note and vital signs reviewed.  Physical Exam   Depression screen [facility] 2/9 05/14/2019 07/12/2017 11/26/2015 12/05/2014 09/24/2014  Decreased Interest 0 0 0 0 0  Down, Depressed, Hopeless 0 0 0 0 0  PHQ - 2 Score 0 0 0 0 0       Assessment & Plan:    Patient Active Problem List   Diagnosis Date Noted  . Healthcare maintenance 05/14/2019  . Screening for STDs (sexually transmitted diseases) 11/14/2018  . Depression 02/24/2017  . Eczema 02/24/2017  . Chlamydia infection 10/29/2016  . Herpes labialis 11/26/2015  . HIV disease (HCC) 09/03/2014  . Gastroparesis 08/30/2014  . Diabetic neuropathy (  [place]) 08/30/2014  . Asthma 08/30/2014  . Tobacco abuse 08/17/2014  . Type 1 diabetes, uncontrolled, with gastroparesis ([place]) 09/01/2010  . Goiter, unspecified 09/01/2010  . Hypertension 09/01/2010     Problem List Items Addressed This Visit    None       I am having Scott Baxter maintain his albuterol, ibuprofen, NovoLOG FlexPen, Contour Next Test, Lantus SoloStar, Pen Needles, Lancets, Biktarvy, and lisinopril.   No orders of the defined types were placed in this encounter.    Follow-up: No follow-ups on file.   Terri Piedra, MSN, FNP-C Nurse Practitioner [facility] [place] number: [phone]

## 2019-10-04 ENCOUNTER — Encounter: Payer: Medicaid Other | Admitting: Internal Medicine

## 2019-10-10 ENCOUNTER — Ambulatory Visit (HOSPITAL_COMMUNITY)
Admission: EM | Admit: 2019-10-10 | Discharge: 2019-10-10 | Disposition: A | Discharge status: Home / Self Care | Payer: Self-pay | Attending: Internal Medicine | Admitting: Internal Medicine

## 2019-10-10 ENCOUNTER — Encounter (HOSPITAL_COMMUNITY): Payer: Self-pay

## 2019-10-10 ENCOUNTER — Other Ambulatory Visit: Payer: Self-pay | Admitting: Family

## 2019-10-10 DIAGNOSIS — H9202 Otalgia, left ear: Secondary | ICD-10-CM

## 2019-10-10 DIAGNOSIS — I10 Essential (primary) hypertension: Secondary | ICD-10-CM

## 2019-10-10 MED ORDER — LORATADINE 10 MG PO TABS
10.0000 mg | ORAL_TABLET | Freq: Every day | ORAL | Status: DC
Start: 2019-10-10 — End: 2020-12-24

## 2019-10-10 MED ORDER — FLUTICASONE PROPIONATE 50 MCG/ACT NA SUSP
1.0000 | Freq: Every day | NASAL | 2 refills | Status: DC
Start: 2019-10-10 — End: 2022-04-16

## 2019-10-10 MED ORDER — FLUTICASONE PROPIONATE 50 MCG/ACT NA SUSP
1.0000 | Freq: Every day | NASAL | 2 refills | Status: DC
Start: 2019-10-10 — End: 2019-10-10

## 2019-10-10 NOTE — ED Triage Notes (Signed)
Pt c/o 3/10 left ear painx4 days. Pt states the hearing in left ear is muffled. Pt denies any other symptoms.

## 2019-10-10 NOTE — ED Provider Notes (Signed)
[facility]    CSN: [id number] Arrival date & time: 10/10/19  1128      History   Chief Complaint Chief Complaint  Patient presents with  . Otalgia    HPI Scott Baxter is a 24 y.o. male comes to the urgent care with complaints of left ear pain of 4 days duration. Pain is of sudden onset and has been getting progressively worse over the past few days. Hearing out of the left ear is muffled. No ringing in the left ear. No nausea or vomiting. No fever or chills. Patient has a history of seasonal allergies and currently has runny nose with postnasal drip. He is not taking his Claritin at this time. He denies any runny nose.  HPI  Past Medical History:  Diagnosis Date  . Asthma   . Depression 02/24/2017  . Diabetes mellitus   . HCAP (healthcare-associated pneumonia) 08/31/2014  . HIV disease (HCC) 09/03/2014  . Intrauterine drug exposure   . IUGR (intrauterine growth restriction)   . Obesity   . Proctitis   . Proctitis   . Recurrent boils    MRSA and MSSA  . Vision abnormalities     Patient Active Problem List   Diagnosis Date Noted  . Healthcare maintenance 05/14/2019  . Screening for STDs (sexually transmitted diseases) 11/14/2018  . Depression 02/24/2017  . Eczema 02/24/2017  . Chlamydia infection 10/29/2016  . Herpes labialis 11/26/2015  . HIV disease (HCC) 09/03/2014  . Gastroparesis 08/30/2014  . Diabetic neuropathy (HCC) 08/30/2014  . Asthma 08/30/2014  . Tobacco abuse 08/17/2014  . Type 1 diabetes, uncontrolled, 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/01/2010  . Goiter, unspecified 09/01/2010  . Hypertension 09/01/2010    Past Surgical History:  Procedure Laterality Date  . EXCISIONAL HEMORRHOIDECTOMY    . FOOT SURGERY  1999   ingrown toenail removal       Home Medications    Prior to Admission medications   Medication Sig Start Date End Date Taking? Authorizing Provider  albuterol (PROVENTIL HFA;VENTOLIN HFA) 108 (90 Base) MCG/ACT inhaler  Inhale 2 puffs into the lungs every 4 (four) hours as needed for wheezing or shortness of breath. 05/11/15   Pisciotta, Joni Reining, PA-C  bictegravir-emtricitabine-tenofovir AF (BIKTARVY) 50-200-25 MG TABS tablet Take 1 tablet by mouth daily. 07/03/19   Veryl Speak, FNP  fluticasone (FLONASE) 50 MCG/ACT nasal spray Place 1 spray into both nostrils daily. 10/10/19   Merrilee Jansky, MD  glucose blood (CONTOUR NEXT TEST) test strip Use as instructed 05/16/19   Levora Dredge, MD  ibuprofen (ADVIL,MOTRIN) 800 MG tablet Take 1 tablet (800 mg total) by mouth 3 (three) times daily. 02/18/16   Lucia Estelle, NP  insulin aspart (NOVOLOG FLEXPEN) 100 UNIT/ML FlexPen Use up to 15 units 3x a day 05/14/19   Veryl Speak, FNP  Insulin Pen Needle (PEN NEEDLES) 31G X 5 MM MISC 1 Units by Does not apply route 4 (four) times daily -  before meals and at bedtime. 05/16/19   Levora Dredge, MD  Lancets MISC 1 Units by Does not apply route 4 (four) times daily -  before meals and at bedtime. 05/16/19   Helberg, Jill Alexanders, MD  LEVEMIR FLEXTOUCH 100 UNIT/ML FlexPen Inject 20 Units into the skin at bedtime. 09/18/19   [provider]  lisinopril (ZESTRIL) 10 MG tablet TAKE 1 TABLET(10 MG) BY MOUTH DAILY 10/10/19   Veryl Speak, FNP  loratadine (CLARITIN) 10 MG tablet Take 1 tablet (10 mg total) by mouth  daily. 10/10/19   Stacey Sago, Britta Mccreedy, MD  Insulin Glargine (LANTUS SOLOSTAR) 100 UNIT/ML Solostar Pen Inject 20 Units into the skin daily at 10 pm. 05/16/19 10/10/19  Levora Dredge, MD    Family History Family History  Problem Relation Age of Onset  . Drug abuse Mother   . Heart disease Mother   . Alcohol abuse Father   . Diabetes Paternal Aunt   . Arthritis Paternal Grandmother   . Stroke Paternal Grandfather   . Diabetes Paternal Grandfather     Social History Social History   Tobacco Use  . Smoking status: Current Every Day Smoker    Packs/day: 0.50    Types: Cigarettes  . Smokeless tobacco: Never Used    Substance Use Topics  . Alcohol use: Yes    Alcohol/week: 0.0 standard drinks    Comment: socially  . Drug use: Yes    Frequency: 3.0 times per week    Types: Marijuana     Allergies   Patient has no known allergies.   Review of Systems Review of Systems  Constitutional: Negative.   HENT: Positive for congestion and ear pain. Negative for ear discharge, facial swelling, sinus pressure, sinus pain and sore throat.   Eyes: Negative for photophobia, pain and itching.  Respiratory: Negative.   Gastrointestinal: Negative.   Neurological: Negative.      Physical Exam Triage Vital Signs ED Triage Vitals [10/10/19 1153]  Enc Vitals Group     BP 134/79     Pulse Rate 73     Resp 16     Temp 98 F (36.7 C)     Temp Source Oral     SpO2 93 %     Weight 205 lb (93 kg)     Height 5\' 9"  (1.753 m)     Head Circumference      Peak Flow      Pain Score 3     Pain Loc      Pain Edu?      Excl. in GC?    No data found.  Updated Vital Signs BP 134/79   Pulse 73   Temp 98 F (36.7 C) (Oral)   Resp 16   Ht 5\' 9"  (1.753 m)   Wt 93 kg   SpO2 93%   BMI 30.27 kg/m   Visual Acuity Right Eye Distance:   Left Eye Distance:   Bilateral Distance:    Right Eye Near:   Left Eye Near:    Bilateral Near:     Physical Exam Vitals and nursing note reviewed.  Constitutional:      General: He is not in acute distress.    Appearance: He is not ill-appearing.  HENT:     Right Ear: Tympanic membrane normal. There is no impacted cerumen.     Left Ear: Tympanic membrane normal. There is no impacted cerumen.     Mouth/Throat:     Mouth: Mucous membranes are moist.     Pharynx: Posterior oropharyngeal erythema present.  Eyes:     Extraocular Movements: Extraocular movements intact.     Pupils: Pupils are equal, round, and reactive to light.  Cardiovascular:     Rate and Rhythm: Normal rate and regular rhythm.  Pulmonary:     Effort: Pulmonary effort is normal.     Breath  sounds: Normal breath sounds.  Abdominal:     General: Bowel sounds are normal.     Palpations: Abdomen is soft.  Musculoskeletal:  General: Normal range of motion.     Cervical back: Normal range of motion and neck supple.  Neurological:     Mental Status: He is alert.      UC Treatments / Results  Labs (all labs ordered are listed, but only abnormal results are displayed) Labs Reviewed - No data to display  EKG   Radiology No results found.  Procedures Procedures (including critical care time)  Medications Ordered in UC Medications - No data to display  Initial Impression / Assessment and Plan / UC Course  I have reviewed the triage vital signs and the nursing notes.  Pertinent labs & imaging results that were available during my care of the patient were reviewed by me and considered in my medical decision making (see chart for details).     1. Otalgia likely associated with allergic rhinitis with postnasal drip: Flonase 1 spray in each nostril daily Claritin 10 mg orally daily Return precautions given No indication for antibiotics If patient symptoms worsens he is advised to return to urgent care to be reevaluated. Final Clinical Impressions(s) / UC Diagnoses   Final diagnoses:  Otalgia of left ear   Discharge Instructions   None    ED Prescriptions    Medication Sig Dispense Auth. Provider   fluticasone (FLONASE) 50 MCG/ACT nasal spray  (Status: Discontinued) Place 1 spray into both nostrils daily. 16 g Chase Picket, MD   loratadine (CLARITIN) 10 MG tablet Take 1 tablet (10 mg total) by mouth daily.  Chase Picket, MD   fluticasone (FLONASE) 50 MCG/ACT nasal spray Place 1 spray into both nostrils daily. 16 g Ikaika Showers, Myrene Galas, MD     PDMP not reviewed this encounter.   Chase Picket, MD 10/10/19 [phone]

## 2019-11-28 ENCOUNTER — Ambulatory Visit: Payer: Self-pay

## 2019-11-28 ENCOUNTER — Other Ambulatory Visit: Payer: Self-pay

## 2019-11-28 DIAGNOSIS — B20 Human immunodeficiency virus [HIV] disease: Secondary | ICD-10-CM

## 2019-11-28 DIAGNOSIS — Z113 Encounter for screening for infections with a predominantly sexual mode of transmission: Secondary | ICD-10-CM

## 2019-11-30 LAB — COMPREHENSIVE METABOLIC PANEL
AG Ratio: 1.8 (calc) (ref 1.0–2.5)
ALT: 24 U/L (ref 9–46)
AST: 24 U/L (ref 10–40)
Albumin: 4.5 g/dL (ref 3.6–5.1)
Alkaline phosphatase (APISO): 57 U/L (ref 36–130)
BUN: 15 mg/dL (ref 7–25)
CO2: 27 mmol/L (ref 20–32)
Calcium: 9.4 mg/dL (ref 8.6–10.3)
Chloride: 103 mmol/L (ref 98–110)
Creat: 1.08 mg/dL (ref 0.60–1.35)
Globulin: 2.5 g/dL (calc) (ref 1.9–3.7)
Glucose, Bld: 165 mg/dL — ABNORMAL HIGH (ref 65–99)
Potassium: 4.5 mmol/L (ref 3.5–5.3)
Sodium: 138 mmol/L (ref 135–146)
Total Bilirubin: 0.8 mg/dL (ref 0.2–1.2)
Total Protein: 7 g/dL (ref 6.1–8.1)

## 2019-11-30 LAB — CBC
HCT: 47.7 % (ref 38.5–50.0)
Hemoglobin: 15.7 g/dL (ref 13.2–17.1)
MCH: 29 pg (ref 27.0–33.0)
MCHC: 32.9 g/dL (ref 32.0–36.0)
MCV: 88 fL (ref 80.0–100.0)
MPV: 12 fL (ref 7.5–12.5)
Platelets: 200 10*3/uL (ref 140–400)
RBC: 5.42 10*6/uL (ref 4.20–5.80)
RDW: 13.5 % (ref 11.0–15.0)
WBC: 5.2 10*3/uL (ref 3.8–10.8)

## 2019-11-30 LAB — RPR: RPR Ser Ql: NONREACTIVE

## 2019-11-30 LAB — HIV-1 RNA QUANT-NO REFLEX-BLD
HIV 1 RNA Quant: <20 copies/mL
HIV-1 RNA Quant, Log: <1.3 Log copies/mL

## 2019-12-18 ENCOUNTER — Encounter: Payer: Medicaid Other | Admitting: Family

## 2020-01-28 ENCOUNTER — Other Ambulatory Visit: Payer: Self-pay | Admitting: Family

## 2020-02-05 ENCOUNTER — Emergency Department (HOSPITAL_COMMUNITY)
Admission: EM | Admit: 2020-02-05 | Discharge: 2020-02-05 | Disposition: A | Discharge status: Home / Self Care | Payer: HRSA Program | Attending: Emergency Medicine | Admitting: Emergency Medicine

## 2020-02-05 ENCOUNTER — Encounter (HOSPITAL_COMMUNITY): Payer: Self-pay | Admitting: *Deleted

## 2020-02-05 DIAGNOSIS — Z20822 Contact with and (suspected) exposure to covid-19: Secondary | ICD-10-CM

## 2020-02-05 DIAGNOSIS — F17210 Nicotine dependence, cigarettes, uncomplicated: Secondary | ICD-10-CM | POA: Insufficient documentation

## 2020-02-05 DIAGNOSIS — U071 COVID-19: Secondary | ICD-10-CM | POA: Insufficient documentation

## 2020-02-05 DIAGNOSIS — Z79899 Other long term (current) drug therapy: Secondary | ICD-10-CM | POA: Diagnosis not present

## 2020-02-05 DIAGNOSIS — I10 Essential (primary) hypertension: Secondary | ICD-10-CM | POA: Diagnosis not present

## 2020-02-05 DIAGNOSIS — Z794 Long term (current) use of insulin: Secondary | ICD-10-CM | POA: Insufficient documentation

## 2020-02-05 DIAGNOSIS — M791 Myalgia, unspecified site: Secondary | ICD-10-CM | POA: Diagnosis present

## 2020-02-05 DIAGNOSIS — E1040 Type 1 diabetes mellitus with diabetic neuropathy, unspecified: Secondary | ICD-10-CM | POA: Diagnosis not present

## 2020-02-05 DIAGNOSIS — J45909 Unspecified asthma, uncomplicated: Secondary | ICD-10-CM | POA: Insufficient documentation

## 2020-02-05 LAB — RESPIRATORY PANEL BY RT PCR (FLU A&B, COVID)
Influenza A by PCR: NEGATIVE
Influenza B by PCR: NEGATIVE
SARS Coronavirus 2 by RT PCR: POSITIVE — AB

## 2020-02-05 MED ORDER — GUAIFENESIN 100 MG/5ML PO LIQD: 100.0000 mg | ORAL | 0 refills | Status: DC | PRN

## 2020-02-05 MED ORDER — ACETAMINOPHEN 500 MG PO TABS
500.0000 mg | ORAL_TABLET | Freq: Four times a day (QID) | ORAL | 0 refills | Status: DC | PRN
Start: 2020-02-05 — End: 2023-02-03

## 2020-02-05 NOTE — ED Triage Notes (Signed)
Per EMS, pt reports body aches since waking up this morning. Took motrin this morning. He is concerned he has COVID-19, his dad tested positive recently. Also lack of appetite, mild cough. Mom wanted him seen, her phone #[phone].   BP 140/80 HR 84 18 O2 99% on RA CBG 230

## 2020-02-05 NOTE — Discharge Instructions (Signed)
You have been tested for COVID-19.  Please use MyChart to check for the results within the next few hours.  If test positive, follow instruction below.  You may follow-up with post Covid clinic for further care.  Return if you have any concern.

## 2020-02-05 NOTE — ED Provider Notes (Signed)
[facility] Provider Note   CSN: [id number] Arrival date & time: 02/05/20  1253     History Chief Complaint  Patient presents with  . Covid Exposure  . Generalized Body Aches    Scott Baxter is a 24 y.o. male.  The history is provided by the patient. No language interpreter was used.     24 year old male significant history of HIV, prior lung infection, asthma, depression, diabetes, presents to the ER via EMS from home with concerns of Covid infection.  Patient reports his dad who Scott Baxter lives with, has been having Covid symptoms for the past 3 days and test positive for Covid 19.  Patient report this morning Scott Baxter woke up having some generalized body aches, congestion, decrease in appetite and occasional cough.  Scott Baxter does not complain of any significant fever or chills no shortness of breath no sore throat no loss of taste or smell no nausea vomiting or diarrhea.  Scott Baxter report his HIV is well controlled and his viral load is undetectable.  Scott Baxter is here primarily to be tested for COVID-19 as well as receiving a work note.  No other complaint.  Pt have not been vaccinated for COVID-19.   Past Medical History:  Diagnosis Date  . Asthma   . Depression 02/24/2017  . Diabetes mellitus   . HCAP (healthcare-associated pneumonia) 08/31/2014  . HIV disease (HCC) 09/03/2014  . Intrauterine drug exposure   . IUGR (intrauterine growth restriction)   . Obesity   . Proctitis   . Proctitis   . Recurrent boils    MRSA and MSSA  . Vision abnormalities     Patient Active Problem List   Diagnosis Date Noted  . Healthcare maintenance 05/14/2019  . Screening for STDs (sexually transmitted diseases) 11/14/2018  . Depression 02/24/2017  . Eczema 02/24/2017  . Chlamydia infection 10/29/2016  . Herpes labialis 11/26/2015  . HIV disease (HCC) 09/03/2014  . Gastroparesis 08/30/2014  . Diabetic neuropathy (HCC) 08/30/2014  . Asthma 08/30/2014  . Tobacco abuse 08/17/2014    . Type 1 diabetes, uncontrolled, 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/01/2010  . Goiter, unspecified 09/01/2010  . Hypertension 09/01/2010    Past Surgical History:  Procedure Laterality Date  . EXCISIONAL HEMORRHOIDECTOMY    . FOOT SURGERY  1999   ingrown toenail removal       Family History  Problem Relation Age of Onset  . Drug abuse Mother   . Heart disease Mother   . Alcohol abuse Father   . Diabetes Paternal Aunt   . Arthritis Paternal Grandmother   . Stroke Paternal Grandfather   . Diabetes Paternal Grandfather     Social History   Tobacco Use  . Smoking status: Current Every Day Smoker    Packs/day: 0.50    Types: Cigarettes  . Smokeless tobacco: Never Used  Vaping Use  . Vaping Use: Never used  Substance Use Topics  . Alcohol use: Yes    Alcohol/week: 0.0 standard drinks    Comment: socially  . Drug use: Yes    Frequency: 3.0 times per week    Types: Marijuana    Home Medications Prior to Admission medications   Medication Sig Start Date End Date Taking? Authorizing Provider  albuterol (PROVENTIL HFA;VENTOLIN HFA) 108 (90 Base) MCG/ACT inhaler Inhale 2 puffs into the lungs every 4 (four) hours as needed for wheezing or shortness of breath. 05/11/15   Pisciotta, Joni Reining, PA-C  bictegravir-emtricitabine-tenofovir AF (BIKTARVY) 50-200-25 MG TABS tablet Take 1 tablet  by mouth daily. 07/03/19   Veryl Speak, FNP  fluticasone (FLONASE) 50 MCG/ACT nasal spray Place 1 spray into both nostrils daily. 10/10/19   Merrilee Jansky, MD  glucose blood (CONTOUR NEXT TEST) test strip Use as instructed 05/16/19   Levora Dredge, MD  ibuprofen (ADVIL,MOTRIN) 800 MG tablet Take 1 tablet (800 mg total) by mouth 3 (three) times daily. 02/18/16   Lucia Estelle, NP  insulin aspart (NOVOLOG FLEXPEN) 100 UNIT/ML FlexPen Use up to 15 units 3x a day 05/14/19   Veryl Speak, FNP  Insulin Pen Needle (PEN NEEDLES) 31G X 5 MM MISC 1 Units by Does not apply route 4 (four) times daily -  before  meals and at bedtime. 05/16/19   Levora Dredge, MD  Lancets MISC 1 Units by Does not apply route 4 (four) times daily -  before meals and at bedtime. 05/16/19   Helberg, Jill Alexanders, MD  LEVEMIR FLEXTOUCH 100 UNIT/ML FlexPen Inject 20 Units into the skin at bedtime. 09/18/19   [provider]  lisinopril (ZESTRIL) 10 MG tablet TAKE 1 TABLET(10 MG) BY MOUTH DAILY 10/10/19   Veryl Speak, FNP  loratadine (CLARITIN) 10 MG tablet Take 1 tablet (10 mg total) by mouth daily. 10/10/19   Merrilee Jansky, MD  Insulin Glargine (LANTUS SOLOSTAR) 100 UNIT/ML Solostar Pen Inject 20 Units into the skin daily at 10 pm. 05/16/19 10/10/19  Levora Dredge, MD    Allergies    Patient has no known allergies.  Review of Systems   Review of Systems  All other systems reviewed and are negative.   Physical Exam Updated Vital Signs BP (!) 142/93 (BP Location: Right Arm)   Pulse 78   Temp 99.5 F (37.5 C) (Oral)   Resp 16   SpO2 98%   Physical Exam Vitals and nursing note reviewed.  Constitutional:      General: Scott Baxter is not in acute distress.    Appearance: Scott Baxter is well-developed.  HENT:     Head: Atraumatic.  Eyes:     Conjunctiva/sclera: Conjunctivae normal.  Cardiovascular:     Rate and Rhythm: Normal rate and regular rhythm.     Pulses: Normal pulses.     Heart sounds: Normal heart sounds.  Pulmonary:     Effort: Pulmonary effort is normal.     Breath sounds: Normal breath sounds.  Abdominal:     Palpations: Abdomen is soft.     Tenderness: There is no abdominal tenderness.  Musculoskeletal:     Cervical back: Neck supple.  Skin:    Findings: No rash.  Neurological:     Mental Status: Scott Baxter is alert and oriented to person, place, and time.  Psychiatric:        Mood and Affect: Mood normal.     ED Results / Procedures / Treatments   Labs (all labs ordered are listed, but only abnormal results are displayed) Labs Reviewed  RESPIRATORY PANEL BY RT PCR (FLU A&B, COVID)     EKG None  Radiology No results found.  Procedures Procedures (including critical care time)  Medications Ordered in ED Medications - No data to display  ED Course  I have reviewed the triage vital signs and the nursing notes.  Pertinent labs & imaging results that were available during my care of the patient were reviewed by me and considered in my medical decision making (see chart for details).    MDM Rules/Calculators/A&P  BP (!) 142/93 (BP Location: Right Arm)   Pulse 78   Temp 99.5 F (37.5 C) (Oral)   Resp 16   SpO2 98%   Final Clinical Impression(s) / ED Diagnoses Final diagnoses:  Suspected COVID-19 virus infection    Rx / DC Orders ED Discharge Orders         Ordered    acetaminophen (TYLENOL) 500 MG tablet  Every 6 hours PRN        02/05/20 1830    guaiFENesin (ROBITUSSIN) 100 MG/5ML liquid  Every 4 hours PRN        02/05/20 1830         Patient here with Covid symptoms.  Will obtain Covid test since patient recently exposed to his dad who tested positive for COVID-19.  Scott Baxter is overall well-appearing, no hypoxia, stable for discharge.  Patient made aware to check for his Covid results through MyChart.  Work note provided.  Post Covid clinic provided as well.  Will also prescribe symptomatic treatment and gave him strict return precaution.  PAT SIRES was evaluated in Emergency Department on 02/05/2020 for the symptoms described in the history of present illness. Scott Baxter was evaluated in the context of the global COVID-19 pandemic, which necessitated consideration that the patient might be at risk for infection with the SARS-CoV-2 virus that causes COVID-19. Institutional protocols and algorithms that pertain to the evaluation of patients at risk for COVID-19 are in a state of rapid change based on information released by regulatory bodies including the CDC and federal and state organizations. These policies and algorithms were  followed during the patient's care in the ED.    Fayrene Helper, PA-C 02/05/20 1833    Tegeler, Canary Brim, MD 02/05/20 [phone]

## 2020-02-06 ENCOUNTER — Other Ambulatory Visit: Payer: Self-pay | Admitting: Infectious Diseases

## 2020-02-06 ENCOUNTER — Telehealth: Payer: Self-pay | Admitting: Infectious Diseases

## 2020-02-06 DIAGNOSIS — I10 Essential (primary) hypertension: Secondary | ICD-10-CM

## 2020-02-06 DIAGNOSIS — B20 Human immunodeficiency virus [HIV] disease: Secondary | ICD-10-CM

## 2020-02-06 DIAGNOSIS — U071 COVID-19: Secondary | ICD-10-CM

## 2020-02-06 DIAGNOSIS — IMO0002 Reserved for concepts with insufficient information to code with codable children: Secondary | ICD-10-CM

## 2020-02-06 NOTE — Progress Notes (Signed)
I connected by phone with Scott Baxter on 02/06/2020 at 10:33 AM to discuss the potential use of a new treatment for mild to moderate COVID-19 viral infection in non-hospitalized patients.  This patient is a 24 y.o. male that meets the FDA criteria for Emergency Use Authorization of COVID monoclonal antibody casirivimab/imdevimab or bamlanivimab/eteseviamb.  Has a (+) direct SARS-CoV-2 viral test result  Has mild or moderate COVID-19   Is NOT hospitalized due to COVID-19  Is within 10 days of symptom onset  Has at least one of the high risk factor(s) for progression to severe COVID-19 and/or hospitalization as defined in EUA.  Specific high risk criteria : BMI > 25, Immunosuppressive Disease or Treatment and Other high risk medical condition per CDC:  SVI risk criteria met   I have spoken and communicated the following to the patient or parent/caregiver regarding COVID monoclonal antibody treatment:  1. FDA has authorized the emergency use for the treatment of mild to moderate COVID-19 in adults and pediatric patients with positive results of direct SARS-CoV-2 viral testing who are 45 years of age and older weighing at least 40 kg, and who are at high risk for progressing to severe COVID-19 and/or hospitalization.  2. The significant known and potential risks and benefits of COVID monoclonal antibody, and the extent to which such potential risks and benefits are unknown.  3. Information on available alternative treatments and the risks and benefits of those alternatives, including clinical trials.  4. Patients treated with COVID monoclonal antibody should continue to self-isolate and use infection control measures (e.g., wear mask, isolate, social distance, avoid sharing personal items, clean and disinfect "high touch" surfaces, and frequent handwashing) according to CDC guidelines.   5. The patient or parent/caregiver has the option to accept or refuse COVID monoclonal antibody  treatment.  After reviewing this information with the patient, the patient has agreed to receive one of the available covid 19 monoclonal antibodies and will be provided an appropriate fact sheet prior to infusion. Janene Madeira, NP 02/06/2020 10:33 AM

## 2020-02-06 NOTE — Telephone Encounter (Signed)
Called to Discuss with patient about Covid symptoms and the use of the monoclonal antibody infusion for those with mild to moderate Covid symptoms and at a high risk of hospitalization.     Pt appears to qualify for this infusion due to co-morbid conditions and/or a member of an at-risk group in accordance with the FDA Emergency Use Authorization.    Sx started 4 days ago.  Qualifiers include diabetes, HIV.  Needs transportation assistance.   He has an appointment coming up with Tammy Sours for ID follow-up.  I change this to a virtual visit as he will still be on quarantine at that time.  I told Scott Baxter if they needed labs we can always schedule a visit out 1 to 2 weeks later.  Rexene Alberts, MSN, NP-C [facility]  [email or web address] Pager: [phone] Office: [phone] [facility] Main Line: [phone]

## 2020-02-07 ENCOUNTER — Ambulatory Visit (HOSPITAL_COMMUNITY)
Admission: RE | Admit: 2020-02-07 | Discharge: 2020-02-07 | Disposition: A | Discharge status: Home / Self Care | Payer: Medicaid Other | Source: Ambulatory Visit | Attending: Pulmonary Disease | Admitting: Pulmonary Disease

## 2020-02-07 DIAGNOSIS — E1043 Type 1 diabetes mellitus with diabetic autonomic (poly)neuropathy: Secondary | ICD-10-CM | POA: Diagnosis present

## 2020-02-07 DIAGNOSIS — B20 Human immunodeficiency virus [HIV] disease: Secondary | ICD-10-CM | POA: Insufficient documentation

## 2020-02-07 DIAGNOSIS — IMO0002 Reserved for concepts with insufficient information to code with codable children: Secondary | ICD-10-CM

## 2020-02-07 DIAGNOSIS — U071 COVID-19: Secondary | ICD-10-CM | POA: Diagnosis present

## 2020-02-07 DIAGNOSIS — I10 Essential (primary) hypertension: Secondary | ICD-10-CM | POA: Diagnosis present

## 2020-02-07 DIAGNOSIS — E1065 Type 1 diabetes mellitus with hyperglycemia: Secondary | ICD-10-CM | POA: Insufficient documentation

## 2020-02-07 DIAGNOSIS — K3184 Gastroparesis: Secondary | ICD-10-CM | POA: Insufficient documentation

## 2020-02-07 MED ORDER — FAMOTIDINE IN NACL 20-0.9 MG/50ML-% IV SOLN: 20.0000 mg | Freq: Once | INTRAVENOUS | Status: DC | PRN

## 2020-02-07 MED ORDER — EPINEPHRINE 0.3 MG/0.3ML IJ SOAJ: 0.3000 mg | Freq: Once | INTRAMUSCULAR | Status: DC | PRN

## 2020-02-07 MED ORDER — METHYLPREDNISOLONE SODIUM SUCC 125 MG IJ SOLR: 125.0000 mg | Freq: Once | INTRAMUSCULAR | Status: DC | PRN

## 2020-02-07 MED ORDER — ALBUTEROL SULFATE HFA 108 (90 BASE) MCG/ACT IN AERS: 2.0000 | INHALATION_SPRAY | Freq: Once | RESPIRATORY_TRACT | Status: DC | PRN

## 2020-02-07 MED ORDER — SODIUM CHLORIDE 0.9 % IV SOLN
1200.0000 mg | Freq: Once | INTRAVENOUS | Status: AC
  Administered 2020-02-07: 1200 mg via INTRAVENOUS

## 2020-02-07 MED ORDER — DIPHENHYDRAMINE HCL 50 MG/ML IJ SOLN: 50.0000 mg | Freq: Once | INTRAMUSCULAR | Status: DC | PRN

## 2020-02-07 MED ORDER — SODIUM CHLORIDE 0.9 % IV SOLN: INTRAVENOUS | Status: DC | PRN

## 2020-02-07 NOTE — Discharge Instructions (Signed)

## 2020-02-07 NOTE — Progress Notes (Signed)
  Diagnosis: COVID-19  Physician: Dr Delford Field  Procedure: Covid Infusion Clinic Med: casirivimab\imdevimab infusion - Provided patient with casirivimab\imdevimab fact sheet for patients, parents and caregivers prior to infusion.  Complications: No immediate complications noted.  Discharge: Discharged home   Azzie Roup [facility] 02/07/2020

## 2020-02-12 ENCOUNTER — Encounter: Payer: Self-pay | Admitting: Family

## 2020-02-12 ENCOUNTER — Other Ambulatory Visit: Payer: Self-pay

## 2020-02-12 ENCOUNTER — Telehealth (INDEPENDENT_AMBULATORY_CARE_PROVIDER_SITE_OTHER): Payer: Self-pay | Admitting: Family

## 2020-02-12 DIAGNOSIS — Z0000 Encounter for general adult medical examination without abnormal findings: Secondary | ICD-10-CM

## 2020-02-12 DIAGNOSIS — B20 Human immunodeficiency virus [HIV] disease: Secondary | ICD-10-CM

## 2020-02-12 MED ORDER — BIKTARVY 50-200-25 MG PO TABS: 1.0000 | ORAL_TABLET | Freq: Every day | ORAL | 5 refills | Status: DC

## 2020-02-12 NOTE — Assessment & Plan Note (Signed)
   Due for routine dental care and will place referral at next office visit  Discussed importance of safe sexual practice to reduce risk of STI.

## 2020-02-12 NOTE — Patient Instructions (Signed)
Nice to see you.  We will continue your current dose of Biktarvy.

## 2020-02-12 NOTE — Progress Notes (Signed)
Subjective:    Patient ID: Scott Baxter, male    DOB: 1995/12/08, 24 y.o.   MRN: [id number]  No chief complaint on file.    Virtual Visit via Telephone/Video Note   I connected Mr. Scott Baxter with on 02/12/2020 at 10:50 am by video and verified that I am speaking with the correct person using two identifiers.   I discussed the limitations, risks, security and privacy concerns of performing an evaluation and management service by telephone and the availability of in person appointments. I also discussed with the patient that there may be a patient responsible charge related to this service. The patient expressed understanding and agreed to proceed.  Location:  Patient: Home Provider: Clinic   HPI:  Scott Baxter is a 24 y.o. male with HIV disease who was last seen in the clinic on 07/03/2019 with poorly controlled HIV disease with less than optimal adherence to his ART regimen of Biktarvy and viral load of 16,600 and CD4 count of 473.  Most recent viral load completed on 11/28/2019 was undetectable.  Telehealth visit for routine follow-up.  Currently Covid positive and unable to make in person office visit.  He did receive Regeneron.  Scott Baxter continues to take his Susanne Borders daily as prescribed with no adverse side effects or missed doses since his last office visit.  Overall feeling better since receiving the monoclonal antibody therapy however still continues to have loss of taste and smell. Denies fevers, chills, night sweats, headaches, changes in vision, neck pain/stiffness, nausea, diarrhea, vomiting, lesions or rashes.  Scott Baxter has no problems obtaining his medication from the pharmacy and has approximately 1 month of medication remaining and renewed his financial assistance recently.  No current recreational or illicit drug use, tobacco use, or alcohol consumption.  Not currently sexually active.  Denies any feelings of being down, depressed, or hopeless  recently.   No Known Allergies    Outpatient Medications Prior to Visit  Medication Sig Dispense Refill  . acetaminophen (TYLENOL) 500 MG tablet Take 1 tablet (500 mg total) by mouth every 6 (six) hours as needed. 30 tablet 0  . albuterol (PROVENTIL HFA;VENTOLIN HFA) 108 (90 Base) MCG/ACT inhaler Inhale 2 puffs into the lungs every 4 (four) hours as needed for wheezing or shortness of breath. 1 Inhaler 0  . glucose blood (CONTOUR NEXT TEST) test strip Use as instructed 300 each 3  . ibuprofen (ADVIL,MOTRIN) 800 MG tablet Take 1 tablet (800 mg total) by mouth 3 (three) times daily. 21 tablet 0  . insulin aspart (NOVOLOG FLEXPEN) 100 UNIT/ML FlexPen Use up to 15 units 3x a day 15 pen 3  . Insulin Pen Needle (PEN NEEDLES) 31G X 5 MM MISC 1 Units by Does not apply route 4 (four) times daily -  before meals and at bedtime. 100 each 11  . Lancets MISC 1 Units by Does not apply route 4 (four) times daily -  before meals and at bedtime. 200 each 11  . LEVEMIR FLEXTOUCH 100 UNIT/ML FlexPen Inject 20 Units into the skin at bedtime.    Marland Kitchen lisinopril (ZESTRIL) 10 MG tablet TAKE 1 TABLET(10 MG) BY MOUTH DAILY 30 tablet 0  . bictegravir-emtricitabine-tenofovir AF (BIKTARVY) 50-200-25 MG TABS tablet Take 1 tablet by mouth daily. 30 tablet 3  . fluticasone (FLONASE) 50 MCG/ACT nasal spray Place 1 spray into both nostrils daily. (Patient not taking: Reported on 02/12/2020) 16 g 2  . guaiFENesin (ROBITUSSIN) 100 MG/5ML liquid Take 5-10 mLs (100-200  mg total) by mouth every 4 (four) hours as needed for cough or congestion. (Patient not taking: Reported on 02/12/2020) 150 mL 0  . loratadine (CLARITIN) 10 MG tablet Take 1 tablet (10 mg total) by mouth daily. (Patient not taking: Reported on 02/12/2020)     No facility-administered medications prior to visit.     Past Medical History:  Diagnosis Date  . Asthma   . Depression 02/24/2017  . Diabetes mellitus   . HCAP (healthcare-associated pneumonia) 08/31/2014   . HIV disease (HCC) 09/03/2014  . Intrauterine drug exposure   . IUGR (intrauterine growth restriction)   . Obesity   . Proctitis   . Proctitis   . Recurrent boils    MRSA and MSSA  . Vision abnormalities      Past Surgical History:  Procedure Laterality Date  . EXCISIONAL HEMORRHOIDECTOMY    . FOOT SURGERY  1999   ingrown toenail removal     Review of Systems  Constitutional: Negative for appetite change, chills, fatigue, fever and unexpected weight change.  HENT:       Loss of taste/smell  Eyes: Negative for visual disturbance.  Respiratory: Negative for cough, chest tightness, shortness of breath and wheezing.   Cardiovascular: Negative for chest pain and leg swelling.  Gastrointestinal: Negative for abdominal pain, constipation, diarrhea, nausea and vomiting.  Genitourinary: Negative for dysuria, flank pain, frequency, genital sores, hematuria and urgency.  Skin: Negative for rash.  Allergic/Immunologic: Negative for immunocompromised state.  Neurological: Negative for dizziness and headaches.      Objective:    Nursing note and vital signs reviewed.    Scott Baxter appears to be doing well and is able to speak in complete sentences.  He is in no acute distress.  He has at home lying in the bed.  Physical exam is limited secondary to telehealth visit.  Assessment & Plan:   Problem List Items Addressed This Visit      Other   HIV disease [facility])    Scott Baxter appears to have adequately controlled HIV disease with good adherence and tolerance to his ART regimen of Biktarvy.  No current signs/symptoms of opportunistic infection or progressive HIV disease.  We reviewed previous lab work and discussed the plan of care.  Continue current dose of Biktarvy.  Plan for follow-up in 3 months or sooner if needed to renew financial assistance as well as update lab work.      Relevant Medications   bictegravir-emtricitabine-tenofovir AF (BIKTARVY) 50-200-25 MG TABS tablet    Healthcare maintenance     Due for routine dental care and will place referral at next office visit  Discussed importance of safe sexual practice to reduce risk of STI.          I am having Scott Baxter maintain his albuterol, ibuprofen, NovoLOG FlexPen, Contour Next Test, Pen Needles, Lancets, lisinopril, Levemir FlexTouch, loratadine, fluticasone, acetaminophen, guaiFENesin, and Biktarvy.   Meds ordered this encounter  Medications  . bictegravir-emtricitabine-tenofovir AF (BIKTARVY) 50-200-25 MG TABS tablet    Sig: Take 1 tablet by mouth daily.    Dispense:  30 tablet    Refill:  5    Order Specific Question:   Supervising Provider    Answer:   Judyann Munson [phone]     I discussed the assessment and treatment plan with the patient. The patient was provided an opportunity to ask questions and all were answered. The patient agreed with the plan and demonstrated an understanding of the instructions.  The patient was advised to call back or seek an in-person evaluation if the symptoms worsen or if the condition fails to improve as anticipated.   I provided   minutes of non-face-to-face time during this encounter.  Follow-up: No follow-ups on file.   Marcos Eke, MSN, FNP-C Nurse Practitioner [facility] for Infectious Disease [facility] Main number: [phone]

## 2020-02-12 NOTE — Assessment & Plan Note (Signed)
Mr. Holberg appears to have adequately controlled HIV disease with good adherence and tolerance to his ART regimen of Biktarvy.  No current signs/symptoms of opportunistic infection or progressive HIV disease.  We reviewed previous lab work and discussed the plan of care.  Continue current dose of Biktarvy.  Plan for follow-up in 3 months or sooner if needed to renew financial assistance as well as update lab work.

## 2020-02-26 ENCOUNTER — Other Ambulatory Visit: Payer: Self-pay | Admitting: Family

## 2020-02-26 DIAGNOSIS — I10 Essential (primary) hypertension: Secondary | ICD-10-CM

## 2020-02-28 ENCOUNTER — Telehealth: Payer: Self-pay

## 2020-02-28 NOTE — Telephone Encounter (Signed)
Called to get patient a follow up scheduled in January, call cannot be completed at this time

## 2020-02-29 ENCOUNTER — Other Ambulatory Visit: Payer: Self-pay | Admitting: Family

## 2020-03-20 ENCOUNTER — Other Ambulatory Visit: Payer: Self-pay | Admitting: Family

## 2020-03-20 ENCOUNTER — Telehealth: Payer: Self-pay

## 2020-03-20 NOTE — Telephone Encounter (Signed)
Attempted to reach out to patient. Received the following message: "The number you have dialed is not in service." Will reach out to patient via MyChart.  I have also contacted his PCP who has agreed to schedule patient to take over his insulin medications.  Valarie Cones

## 2020-03-20 NOTE — Telephone Encounter (Signed)
-----   Message from [facility] sent at 03/20/2020 10:16 AM EST ----- Regarding: Medication Patient called about medication Levemir said pharmacy has put a stop on it, he thinks its because he hasn't had labs recently. Has scheduled a lab and office visit with greg. If some one could give him a call at [phone]

## 2020-03-26 ENCOUNTER — Other Ambulatory Visit: Payer: Self-pay

## 2020-03-26 DIAGNOSIS — B20 Human immunodeficiency virus [HIV] disease: Secondary | ICD-10-CM

## 2020-03-26 DIAGNOSIS — Z113 Encounter for screening for infections with a predominantly sexual mode of transmission: Secondary | ICD-10-CM

## 2020-03-26 DIAGNOSIS — Z79899 Other long term (current) drug therapy: Secondary | ICD-10-CM

## 2020-04-09 ENCOUNTER — Ambulatory Visit (INDEPENDENT_AMBULATORY_CARE_PROVIDER_SITE_OTHER): Payer: Self-pay | Admitting: Family

## 2020-04-09 ENCOUNTER — Other Ambulatory Visit: Payer: Self-pay

## 2020-04-09 ENCOUNTER — Encounter: Payer: Self-pay | Admitting: Family

## 2020-04-09 VITALS — BP 156/99 | HR 87 | Temp 98.0°F | Ht 69.0 in | Wt 228.0 lb

## 2020-04-09 DIAGNOSIS — Z720 Tobacco use: Secondary | ICD-10-CM

## 2020-04-09 DIAGNOSIS — Z0000 Encounter for general adult medical examination without abnormal findings: Secondary | ICD-10-CM

## 2020-04-09 DIAGNOSIS — B20 Human immunodeficiency virus [HIV] disease: Secondary | ICD-10-CM

## 2020-04-09 DIAGNOSIS — IMO0002 Reserved for concepts with insufficient information to code with codable children: Secondary | ICD-10-CM

## 2020-04-09 DIAGNOSIS — K3184 Gastroparesis: Secondary | ICD-10-CM

## 2020-04-09 DIAGNOSIS — E1065 Type 1 diabetes mellitus with hyperglycemia: Secondary | ICD-10-CM

## 2020-04-09 DIAGNOSIS — Z113 Encounter for screening for infections with a predominantly sexual mode of transmission: Secondary | ICD-10-CM

## 2020-04-09 DIAGNOSIS — E1043 Type 1 diabetes mellitus with diabetic autonomic (poly)neuropathy: Secondary | ICD-10-CM

## 2020-04-09 DIAGNOSIS — F330 Major depressive disorder, recurrent, mild: Secondary | ICD-10-CM

## 2020-04-09 DIAGNOSIS — Z23 Encounter for immunization: Secondary | ICD-10-CM

## 2020-04-09 MED ORDER — BIKTARVY 50-200-25 MG PO TABS: 1.0000 | ORAL_TABLET | Freq: Every day | ORAL | 5 refills | Status: DC

## 2020-04-09 NOTE — Assessment & Plan Note (Signed)
Most recent A1c checked being 10.0. Has follow-up appointment with primary care scheduled for tomorrow. Check hemoglobin A1c today so results are available for appointment tomorrow. Appreciate primary care's assistance in managing diabetes.

## 2020-04-09 NOTE — Assessment & Plan Note (Signed)
Mr. Becvar continues to have well-controlled HIV disease with good adherence and tolerance to his ART regimen of Biktarvy. No signs/symptoms of opportunistic infection or progressive HIV disease. Reviewed previous lab work and discussed plan of care. Continue current dose of Biktarvy. Check blood work today. Plan for follow-up in 2 months or sooner if needed with lab work on the same day.

## 2020-04-09 NOTE — Assessment & Plan Note (Signed)
·   Discussed importance of safe sexual practice to reduce risk of STI. Condoms provided.  [facility] contact information provided to schedule for routine dental care.  Influenza and tetanus updated today.  Covid vaccine scheduled for January secondary to receiving monoclonal antibodies.

## 2020-04-09 NOTE — Assessment & Plan Note (Signed)
Scott Baxter continues to have battles with depression likely related to his current living situation which she is working on Tax adviser. No suicidal ideation or signs of psychosis. Recommend establishing with counseling prior to starting medication if necessary. He is open to this and will schedule with our counselor here. Continue to monitor.

## 2020-04-09 NOTE — Progress Notes (Signed)
Subjective:    Patient ID: Scott Baxter, male    DOB: 01-31-1996, 24 y.o.   MRN: [id number]  Chief Complaint  Patient presents with  . HIV Positive/AIDS     HPI:  Scott Baxter is a 24 y.o. male with HIV disease who was last seen for a telehealth visit on 02/12/20 with good adherence and tolerance to his ART regimen of Biktarvy. Blood work from 11/28/19 shows a viral load that was undetectable and CD4 count of 302. Here today for routine follow up.   Mr. Iannello continues to take his Susanne Borders daily as prescribed with one missed dose since his last office visit. This was related to a a delay in pharmacy filling medication. Overall feeling well today with no new concerns/complaints. Denies fevers, chills, night sweats, headaches, changes in vision, neck pain/stiffness, nausea, diarrhea, vomiting, lesions or rashes.  Mr. Merriott has no problems obtaining his medication from his pharmacy beyond the 1 refill. Has been feeling some depression recently that is a lot stronger than in the past and believes it may be related to his current living situation. He has no current days that are 100% without feeling down or depressed. 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 Denies any suicidal ideations. Continues to smoke marijuana 3-4 times per week on average and consumes alcohol socially. Tobacco use is down to 1 to 2 cigarettes/day. Condoms provided. Interested in receiving his influenza and tetanus boosters today. Due for routine dental care.  No Known Allergies    Outpatient Medications Prior to Visit  Medication Sig Dispense Refill  . acetaminophen (TYLENOL) 500 MG tablet Take 1 tablet (500 mg total) by mouth every 6 (six) hours as needed. 30 tablet 0  . albuterol (PROVENTIL HFA;VENTOLIN HFA) 108 (90 Base) MCG/ACT inhaler Inhale 2 puffs into the lungs every 4 (four) hours as needed for wheezing or shortness of breath. 1 Inhaler 0  . fluticasone (FLONASE) 50 MCG/ACT nasal spray Place  1 spray into both nostrils daily. 16 g 2  . glucose blood (CONTOUR NEXT TEST) test strip Use as instructed 300 each 3  . guaiFENesin (ROBITUSSIN) 100 MG/5ML liquid Take 5-10 mLs (100-200 mg total) by mouth every 4 (four) hours as needed for cough or congestion. 150 mL 0  . ibuprofen (ADVIL,MOTRIN) 800 MG tablet Take 1 tablet (800 mg total) by mouth 3 (three) times daily. 21 tablet 0  . insulin aspart (NOVOLOG FLEXPEN) 100 UNIT/ML FlexPen Use up to 15 units 3x a day 15 pen 3  . Insulin Pen Needle (PEN NEEDLES) 31G X 5 MM MISC 1 Units by Does not apply route 4 (four) times daily -  before meals and at bedtime. 100 each 11  . Lancets MISC 1 Units by Does not apply route 4 (four) times daily -  before meals and at bedtime. 200 each 11  . LEVEMIR FLEXTOUCH 100 UNIT/ML FlexPen Inject 20 Units into the skin at bedtime.    Marland Kitchen lisinopril (ZESTRIL) 10 MG tablet TAKE 1 TABLET(10 MG) BY MOUTH DAILY 30 tablet 3  . loratadine (CLARITIN) 10 MG tablet Take 1 tablet (10 mg total) by mouth daily.    . bictegravir-emtricitabine-tenofovir AF (BIKTARVY) 50-200-25 MG TABS tablet Take 1 tablet by mouth daily. 30 tablet 5   No facility-administered medications prior to visit.     Past Medical History:  Diagnosis Date  . Asthma   . Depression 02/24/2017  . Diabetes mellitus   . HCAP (healthcare-associated pneumonia) 08/31/2014  .  HIV disease (HCC) 09/03/2014  . Intrauterine drug exposure   . IUGR (intrauterine growth restriction)   . Obesity   . Proctitis   . Proctitis   . Recurrent boils    MRSA and MSSA  . Vision abnormalities      Past Surgical History:  Procedure Laterality Date  . EXCISIONAL HEMORRHOIDECTOMY    . FOOT SURGERY  1999   ingrown toenail removal       Review of Systems  Constitutional: Negative for appetite change, chills, fatigue, fever and unexpected weight change.  Eyes: Negative for visual disturbance.  Respiratory: Negative for cough, chest tightness, shortness of breath and  wheezing.   Cardiovascular: Negative for chest pain and leg swelling.  Gastrointestinal: Negative for abdominal pain, constipation, diarrhea, nausea and vomiting.  Genitourinary: Negative for dysuria, flank pain, frequency, genital sores, hematuria and urgency.  Skin: Negative for rash.  Allergic/Immunologic: Negative for immunocompromised state.  Neurological: Negative for dizziness and headaches.      Objective:    BP (!) 156/99   Pulse 87   Temp 98 F (36.7 C)   Ht 5\' 9"  (1.753 m)   Wt 228 lb (103.4 kg)   BMI 33.67 kg/m  Nursing note and vital signs reviewed.  Physical Exam Constitutional:      General: He is not in acute distress.    Appearance: He is well-developed.  Eyes:     Conjunctiva/sclera: Conjunctivae normal.  Cardiovascular:     Rate and Rhythm: Normal rate and regular rhythm.     Heart sounds: Normal heart sounds. No murmur heard.  No friction rub. No gallop.   Pulmonary:     Effort: Pulmonary effort is normal. No respiratory distress.     Breath sounds: Normal breath sounds. No wheezing or rales.  Chest:     Chest wall: No tenderness.  Abdominal:     General: Bowel sounds are normal.     Palpations: Abdomen is soft.     Tenderness: There is no abdominal tenderness.  Musculoskeletal:     Cervical back: Neck supple.  Lymphadenopathy:     Cervical: No cervical adenopathy.  Skin:    General: Skin is warm and dry.     Findings: No rash.  Neurological:     Mental Status: He is alert and oriented to person, place, and time.  Psychiatric:        Behavior: Behavior normal.        Thought Content: Thought content normal.        Judgment: Judgment normal.      Depression screen [facility] 2/9 04/09/2020 05/14/2019 07/12/2017 11/26/2015 12/05/2014  Decreased Interest 0 0 0 0 0  Down, Depressed, Hopeless 0 0 0 0 0  PHQ - 2 Score 0 0 0 0 0       Assessment & Plan:    Patient Active Problem List   Diagnosis Date Noted  . Healthcare maintenance 05/14/2019  .  Screening for STDs (sexually transmitted diseases) 11/14/2018  . Depression 02/24/2017  . Eczema 02/24/2017  . Chlamydia infection 10/29/2016  . Herpes labialis 11/26/2015  . HIV disease (HCC) 09/03/2014  . Gastroparesis 08/30/2014  . Diabetic neuropathy (HCC) 08/30/2014  . Asthma 08/30/2014  . Tobacco abuse 08/17/2014  . Type 1 diabetes, uncontrolled, 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/01/2010  . Goiter, unspecified 09/01/2010  . Hypertension 09/01/2010     Problem List Items Addressed This Visit      Digestive   Type 1 diabetes, uncontrolled, with gastroparesis (HCC)  Most recent A1c checked being 10.0. Has follow-up appointment with primary care scheduled for tomorrow. Check hemoglobin A1c today so results are available for appointment tomorrow. Appreciate primary care's assistance in managing diabetes.      Relevant Orders   HgB A1c     Other   Tobacco abuse    Mr. Stalling is down to 1 to 2 cigarettes/day on average. Discussed importance of tobacco cessation to reduce risk of cardiovascular, respiratory, and malignant disease in the future. He is contemplating quitting. Continue to monitor.      HIV disease [facility]) - Primary    Mr. Mayhall continues to have well-controlled HIV disease with good adherence and tolerance to his ART regimen of Biktarvy. No signs/symptoms of opportunistic infection or progressive HIV disease. Reviewed previous lab work and discussed plan of care. Continue current dose of Biktarvy. Check blood work today. Plan for follow-up in 2 months or sooner if needed with lab work on the same day.      Relevant Medications   bictegravir-emtricitabine-tenofovir AF (BIKTARVY) 50-200-25 MG TABS tablet   Other Relevant Orders   COMPLETE METABOLIC PANEL WITH GFR   T-helper cell (CD4)- ([facility] clinic only)   HIV-1 RNA quant-no reflex-bld   Depression    Mr. Chaput continues to have battles with depression likely related to his current living situation which she is working  on Tax adviser. No suicidal ideation or signs of psychosis. Recommend establishing with counseling prior to starting medication if necessary. He is open to this and will schedule with our counselor here. Continue to monitor.       Screening for STDs (sexually transmitted diseases)   Relevant Orders   RPR   Healthcare maintenance     Discussed importance of safe sexual practice to reduce risk of STI. Condoms provided.  [facility] contact information provided to schedule for routine dental care.  Influenza and tetanus updated today.  Covid vaccine scheduled for January secondary to receiving monoclonal antibodies.       Other Visit Diagnoses    Need for immunization against influenza       Relevant Orders   Flu Vaccine QUAD 36+ mos IM (Completed)   Need for Tdap vaccination           I am having [name] maintain his albuterol, ibuprofen, NovoLOG FlexPen, Contour Next Test, Pen Needles, Lancets, Levemir FlexTouch, loratadine, fluticasone, acetaminophen, guaiFENesin, lisinopril, and Biktarvy.   Meds ordered this encounter  Medications  . bictegravir-emtricitabine-tenofovir AF (BIKTARVY) 50-200-25 MG TABS tablet    Sig: Take 1 tablet by mouth daily.    Dispense:  30 tablet    Refill:  5    Order Specific Question:   Supervising Provider    Answer:   Judyann Munson [[id number]]     Follow-up: Return in about 2 months (around 06/10/2020), or if symptoms worsen or fail to improve.   Marcos Eke, MSN, FNP-C Nurse Practitioner [facility] for Infectious Disease [facility] Main number: [phone]

## 2020-04-09 NOTE — Assessment & Plan Note (Signed)
Scott Baxter is down to 1 to 2 cigarettes/day on average. Discussed importance of tobacco cessation to reduce risk of cardiovascular, respiratory, and malignant disease in the future. He is contemplating quitting. Continue to monitor.

## 2020-04-09 NOTE — Patient Instructions (Addendum)
Nice to see you.   Continue to take your [place] daily as prescribed.  Refills have been sent to the pharmacy.  Follow up with Primary Care for your diabetes - we will check your A1c today.   Please call [facility]) to schedule/follow up on your dental care at [phone]  Schedule an appointment with Marylu Lund for counseling.  Plan for follow up in 2 months or sooner if needed with lab work 1-2 weeks prior to appointment or on same day.  Have a great day and stay safe!  Happy Holidays!

## 2020-04-10 ENCOUNTER — Ambulatory Visit (INDEPENDENT_AMBULATORY_CARE_PROVIDER_SITE_OTHER): Payer: Self-pay | Admitting: Internal Medicine

## 2020-04-10 ENCOUNTER — Other Ambulatory Visit: Payer: Self-pay | Admitting: Internal Medicine

## 2020-04-10 ENCOUNTER — Telehealth: Payer: Self-pay

## 2020-04-10 ENCOUNTER — Encounter: Payer: Self-pay | Admitting: Internal Medicine

## 2020-04-10 VITALS — BP 153/90 | HR 93 | Temp 98.7°F | Ht 69.0 in | Wt 227.5 lb

## 2020-04-10 DIAGNOSIS — S90211A Contusion of right great toe with damage to nail, initial encounter: Secondary | ICD-10-CM

## 2020-04-10 DIAGNOSIS — S90219A Contusion of unspecified great toe with damage to nail, initial encounter: Secondary | ICD-10-CM

## 2020-04-10 DIAGNOSIS — E1043 Type 1 diabetes mellitus with diabetic autonomic (poly)neuropathy: Secondary | ICD-10-CM

## 2020-04-10 DIAGNOSIS — K3184 Gastroparesis: Secondary | ICD-10-CM

## 2020-04-10 DIAGNOSIS — I10 Essential (primary) hypertension: Secondary | ICD-10-CM

## 2020-04-10 DIAGNOSIS — IMO0002 Reserved for concepts with insufficient information to code with codable children: Secondary | ICD-10-CM

## 2020-04-10 DIAGNOSIS — A530 Latent syphilis, unspecified as early or late: Secondary | ICD-10-CM

## 2020-04-10 DIAGNOSIS — X58XXXA Exposure to other specified factors, initial encounter: Secondary | ICD-10-CM

## 2020-04-10 DIAGNOSIS — E1065 Type 1 diabetes mellitus with hyperglycemia: Secondary | ICD-10-CM

## 2020-04-10 LAB — T-HELPER CELL (CD4) - (RCID CLINIC ONLY)
CD4 % Helper T Cell: 39 % (ref 33–65)
CD4 T Cell Abs: 760 /uL (ref 400–1790)

## 2020-04-10 MED ORDER — LISINOPRIL 20 MG PO TABS: 40.0000 mg | ORAL_TABLET | Freq: Every day | ORAL | 2 refills | Status: DC

## 2020-04-10 MED ORDER — LANCETS MISC: 1.0000 [IU] | Freq: Three times a day (TID) | 11 refills | Status: AC

## 2020-04-10 MED ORDER — PEN NEEDLES 31G X 5 MM MISC: 1.0000 [IU] | Freq: Three times a day (TID) | 11 refills | Status: DC

## 2020-04-10 MED ORDER — NOVOLOG FLEXPEN 100 UNIT/ML ~~LOC~~ SOPN: PEN_INJECTOR | SUBCUTANEOUS | 3 refills | Status: DC

## 2020-04-10 MED ORDER — CONTOUR NEXT TEST VI STRP: ORAL_STRIP | 3 refills | Status: DC

## 2020-04-10 MED ORDER — LEVEMIR FLEXTOUCH 100 UNIT/ML ~~LOC~~ SOPN: 20.0000 [IU] | PEN_INJECTOR | Freq: Every day | SUBCUTANEOUS | 3 refills | Status: DC

## 2020-04-10 MED FILL — CONTOUR NEXT TEST STRP: 25 days supply | Qty: 100 | Fill #0

## 2020-04-10 MED FILL — LEVEMIR FLEXTOUCH 100 UNITS: 100 | 30 days supply | Qty: 6 | Fill #0

## 2020-04-10 MED FILL — LISINOPRIL 20 MG TABLET: 20 | 30 days supply | Qty: 60 | Fill #0

## 2020-04-10 MED FILL — NOVOLOG FLEXPEN SYRINGE: 100 | 26 days supply | Qty: 12 | Fill #0

## 2020-04-10 MED FILL — MICROLET LANCETS MISC: 25 days supply | Qty: 100 | Fill #0

## 2020-04-10 MED FILL — UNIFINE PENTIPS 31GX3/16: 31G X 5 MM | 25 days supply | Qty: 100 | Fill #0

## 2020-04-10 NOTE — Assessment & Plan Note (Signed)
Hypertension: Uncontrolled.  BP Readings from Last 3 Encounters:  04/10/20 (!) 153/90  04/09/20 (!) 156/99  02/07/20 (!) 139/95   Plan: -Increase lisinopril to 40 mg daily -Follow BMP today

## 2020-04-10 NOTE — Assessment & Plan Note (Signed)
Elevated RPR: It appears that he had an elevated RPR titer 1: 16 on his recent lab from April 09, 2020.  Previously his titer was 1: 1 in December 2020.  I do not see any diagnosis of syphilis or any prior treatments of syphilis.  He does follow-up with infectious disease and I will reach out to them regarding this new finding.

## 2020-04-10 NOTE — Assessment & Plan Note (Signed)
Subungual hematoma: He states that in October he started working at [employer] and began wearing closed toed steel boots and since then he noticed darkened discoloration of his right great toe has somewhat improved since he stopped wearing the closed toe shoes.  He denies any surrounding swelling, fevers, chills or signs of infection.    On physical exams, he has 2+ dorsalis pedis pulses.  No erythema, warmness or tenderness.  Assessment and plan: This is most likely subungual hematoma however we will keep a close watch on it and patient has been advised to closely monitor.

## 2020-04-10 NOTE — Assessment & Plan Note (Signed)
Type 1 diabetes mellitus: This is uncontrolled.  His last A1c was 10.9% on April 09, 2020 prior to that it was 10% and 7.5%.  He states that due to unfortunate circumstances, he has been living out of his car since September and has been out of his diabetes regimen.  He typically will use some of his family members insulin sporadically.  His current regimen is Levemir 20 units daily and NovoLog sliding scale.  In 2019 he was being followed up by lumbar endocrinology however states that he was discharged due to no-shows.  Unfortunately, he does not have his glucose monitoring supplies and thus we do not have any readings of his capillary blood sugars.  Plan: -Refills have been provided for diabetes supplies -Continue Levemir 20 units daily, NovoLog sliding scale -Provided assistance to apply for insurance via the affordable care act.  Also given assistance through our financial counselor at the [facility] -Referral to chronic care management has been placed -Referral to optometry has been placed

## 2020-04-10 NOTE — Telephone Encounter (Signed)
[facility] lab calling to report critical l glucose lab value of [phone] . Specimen was drawn on 04-09-2020 @ 3:01 pm.  Lab was verified by repeat.  Request for lab result to be faxed to our office.  Lab has not posted to Epic.  I am still awaiting fax . [facility] lab supervisor has been informed and will call for a copy of labs   Averey Koning Gorden Harms, RN

## 2020-04-10 NOTE — Telephone Encounter (Signed)
Received notification of Scott Baxter blood sugar levels of >400. Known diabetic and had no symptoms during visit. Advised to continue taking insulin as prescribed and follow up with primary care which was scheduled for today.

## 2020-04-10 NOTE — Patient Instructions (Signed)
Hi Scott Baxter,   Thanks for seeing [place] today.  In regards to your diabetes, I have refilled all your medications and he can pick them up at the [facility] outpatient pharmacy  [address] [place], [place], [place] [address]  I would also like for you to see our financial counselor here in the clinic as well as our chronic care management staff to assist you with managing the diabetes.  Please keep a close eye on your foot and if this worsens, please give [place] a call.  Take care! Dr. Dortha Schwalbe  Please call the internal medicine center clinic if you have any questions or concerns, we may be able to help and keep you from a long and expensive emergency room wait. Our clinic and after hours phone number is [phone], the best time to call is Monday through Friday 9 am to 4 pm but there is always someone available 24/7 if you have an emergency. If you need medication refills please notify your pharmacy one week in advance and they will send [place] a request.   If you have not gotten the COVID vaccine, I recommend doing so:  You may get it at your local [facility] or [facility] OR To schedule an appointment for a COVID vaccine or be added to the vaccine wait list: Go to [email or web address]   OR Go to [email or web address]                  OR Call [phone]                                     OR Call [phone] and select Option 2

## 2020-04-10 NOTE — Progress Notes (Signed)
   CC: Type 1 diabetes mellitus  HPI:  [name] is a 24 y.o. with uncontrolled type 1 diabetes mellitus here for follow-up.  Please see problem based charting for further details.  Past Medical History:  Diagnosis Date  . Asthma   . Depression 02/24/2017  . Diabetes mellitus   . HCAP (healthcare-associated pneumonia) 08/31/2014  . HIV disease (HCC) 09/03/2014  . Intrauterine drug exposure   . IUGR (intrauterine growth restriction)   . Obesity   . Proctitis   . Proctitis   . Recurrent boils    MRSA and MSSA  . Vision abnormalities    Review of Systems:  As per HPI  Physical Exam:  Vitals:   04/10/20 1538  BP: (!) 153/90  Pulse: 93  Temp: 98.7 F (37.1 C)  TempSrc: Oral  SpO2: 100%  Weight: 227 lb 8 oz (103.2 kg)  Height: 5\' 9"  (1.753 m)   Physical Exam Vitals and nursing note reviewed.  HENT:     Head: Normocephalic and atraumatic.  Eyes:     Conjunctiva/sclera: Conjunctivae normal.  Cardiovascular:     Rate and Rhythm: Normal rate.     Pulses: Normal pulses.          Dorsalis pedis pulses are 2+ on the right side and 2+ on the left side.     Heart sounds: Normal heart sounds. No murmur heard.   Pulmonary:     Effort: Pulmonary effort is normal. No respiratory distress.     Breath sounds: Normal breath sounds.  Neurological:     Mental Status: He is alert.   Extremity:     Assessment & Plan:   See Encounters Tab for problem based charting.  Patient discussed with Dr. 

## 2020-04-11 ENCOUNTER — Telehealth: Payer: Self-pay

## 2020-04-11 ENCOUNTER — Other Ambulatory Visit: Payer: Self-pay | Admitting: Internal Medicine

## 2020-04-11 ENCOUNTER — Encounter: Payer: Self-pay | Admitting: Student

## 2020-04-11 ENCOUNTER — Telehealth: Payer: Self-pay | Admitting: Internal Medicine

## 2020-04-11 ENCOUNTER — Telehealth: Payer: Self-pay | Admitting: *Deleted

## 2020-04-11 DIAGNOSIS — IMO0002 Reserved for concepts with insufficient information to code with codable children: Secondary | ICD-10-CM

## 2020-04-11 LAB — COMPLETE METABOLIC PANEL WITH GFR
AG Ratio: 1.5 (calc) (ref 1.0–2.5)
ALT: 25 U/L (ref 9–46)
AST: 19 U/L (ref 10–40)
Albumin: 4.7 g/dL (ref 3.6–5.1)
Alkaline phosphatase (APISO): 82 U/L (ref 36–130)
BUN: 19 mg/dL (ref 7–25)
CO2: 26 mmol/L (ref 20–32)
Calcium: 9.9 mg/dL (ref 8.6–10.3)
Chloride: 97 mmol/L — ABNORMAL LOW (ref 98–110)
Creat: 1.02 mg/dL (ref 0.60–1.35)
GFR, Est African American: 119 mL/min/{1.73_m2} (ref >=60)
GFR, Est Non African American: 102 mL/min/{1.73_m2} (ref >=60)
Globulin: 3.2 g/dL (calc) (ref 1.9–3.7)
Glucose, Bld: 438 mg/dL — ABNORMAL HIGH (ref 65–99)
Potassium: 4.6 mmol/L (ref 3.5–5.3)
Sodium: 132 mmol/L — ABNORMAL LOW (ref 135–146)
Total Bilirubin: 0.6 mg/dL (ref 0.2–1.2)
Total Protein: 7.9 g/dL (ref 6.1–8.1)

## 2020-04-11 LAB — BMP8+ANION GAP
Anion Gap: 19 mmol/L — ABNORMAL HIGH (ref 10.0–18.0)
BUN/Creatinine Ratio: 16 (ref 9–20)
BUN: 14 mg/dL (ref 6–20)
CO2: 19 mmol/L — ABNORMAL LOW (ref 20–29)
Calcium: 9.8 mg/dL (ref 8.7–10.2)
Chloride: 96 mmol/L (ref 96–106)
Creatinine, Ser: 0.86 mg/dL (ref 0.76–1.27)
GFR calc Af Amer: 140 mL/min/{1.73_m2} (ref >59)
GFR calc non Af Amer: 121 mL/min/{1.73_m2} (ref >59)
Glucose: 452 mg/dL — ABNORMAL HIGH (ref 65–99)
Potassium: 4.4 mmol/L (ref 3.5–5.2)
Sodium: 134 mmol/L (ref 134–144)

## 2020-04-11 LAB — HIV-1 RNA QUANT-NO REFLEX-BLD
HIV 1 RNA Quant: <20 Copies/mL
HIV-1 RNA Quant, Log: <1.3 Log cps/mL

## 2020-04-11 LAB — HEMOGLOBIN A1C
Hgb A1c MFr Bld: 10.9 % of total Hgb — ABNORMAL HIGH (ref <5.7)
Mean Plasma Glucose: 266 (calc)
eAG (mmol/L): 14.7 (calc)

## 2020-04-11 LAB — RPR TITER: RPR Titer: 1:16 {titer} — ABNORMAL HIGH

## 2020-04-11 LAB — RPR: RPR Ser Ql: REACTIVE — AB

## 2020-04-11 LAB — FLUORESCENT TREPONEMAL AB(FTA)-IGG-BLD: Fluorescent Treponemal ABS: REACTIVE — AB

## 2020-04-11 NOTE — Telephone Encounter (Signed)
Attempted to call patient to relay test results and schedule appointment. Number on file is no longer in service. Mychart message sent to patient to schedule treatment and update chart.   Kaisy Severino Loyola Mast, RN

## 2020-04-11 NOTE — Telephone Encounter (Signed)
-----   Message from Veryl Speak, FNP sent at 04/10/2020  4:38 PM EST ----- Please call and schedule Mr. 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

## 2020-04-11 NOTE — Chronic Care Management (AMB) (Signed)
  Care Management   Outreach Note  04/11/2020 Name: Scott Baxter MRN: [id number] DOB: [birth year]  Scott Baxter is a 24 y.o. year old male who is a primary care patient of Katsadouros, Heidi Dach, MD. I reached out to [name] by phone today in response to a referral sent by Mr. [name]'s PCP, [name], MD.     An unsuccessful telephone outreach was attempted today. The patient was referred to the case management team for assistance with care management and care coordination.   Follow Up Plan: The care management team will reach out to the patient again over the next 7 days.  If patient returns call to provider office, please advise to call Embedded Care Management Care Guide Gwenevere Ghazi at [phone].  Gwenevere Ghazi  Care Guide, Embedded Care Coordination [facility] Management

## 2020-04-11 NOTE — Telephone Encounter (Signed)
On review of labs, it shows hyperglycemia with glucose of 452, bicarb of 19, anion gap of 19 which suggest early DKA. Patient has history of type 1 diabetes mellitus which is uncontrolled. I tried reaching out to him multiple times however I keep getting the message that his phone is not in service. I reached out to his grandmother who states that she would also try reaching out to several family members to have my message relayed. I would like for him to return to the clinic for urinalysis and repeat BMP. I would also like for him to be aware that if he starts having signs or symptoms of DKA, he should report to the emergency department.

## 2020-04-14 ENCOUNTER — Telehealth: Payer: Self-pay

## 2020-04-14 ENCOUNTER — Other Ambulatory Visit: Payer: Self-pay

## 2020-04-14 ENCOUNTER — Ambulatory Visit (INDEPENDENT_AMBULATORY_CARE_PROVIDER_SITE_OTHER): Payer: Self-pay

## 2020-04-14 DIAGNOSIS — A539 Syphilis, unspecified: Secondary | ICD-10-CM

## 2020-04-14 MED ORDER — PENICILLIN G BENZATHINE 1200000 UNIT/2ML IM SUSP
1.2000 10*6.[IU] | Freq: Once | INTRAMUSCULAR | Status: AC
  Administered 2020-04-14: 1.2 10*6.[IU] via INTRAMUSCULAR

## 2020-04-14 NOTE — Telephone Encounter (Signed)
Patient called to state he was just here for Bicillin treatment. Patient asking why he has genital ulcers and when they will go away. He has multiple smaller ulcers that started about 2 weeks ago, with the newest one appearing 3-4 days ago. He states they are not painful. However, patient also complains of a much larger ulcer on his scrotum which is "abscess-like" that drains serosanguinous fluid and is painful. It has been there for about 1 month and he is able to pop it and drain pale bloody fluid from it.   Sandie Ano, RN

## 2020-04-14 NOTE — Telephone Encounter (Signed)
Patient called back and scheduled with Marcos Eke, NP 04/15/20.  Sandie Ano, RN

## 2020-04-14 NOTE — Telephone Encounter (Signed)
Per Marcos Eke, NP patient will need appointment as the larger ulcer is likely not due to Syphilis. Attempted to call patient, no answer. Left HIPAA compliant voicemail requesting callback.   Sandie Ano, RN

## 2020-04-15 ENCOUNTER — Telehealth: Payer: Self-pay

## 2020-04-15 ENCOUNTER — Ambulatory Visit (INDEPENDENT_AMBULATORY_CARE_PROVIDER_SITE_OTHER): Payer: Self-pay | Admitting: Family

## 2020-04-15 ENCOUNTER — Encounter: Payer: Self-pay | Admitting: Family

## 2020-04-15 VITALS — BP 135/84 | HR 87 | Temp 98.0°F | Ht 69.0 in | Wt 228.0 lb

## 2020-04-15 DIAGNOSIS — B20 Human immunodeficiency virus [HIV] disease: Secondary | ICD-10-CM

## 2020-04-15 DIAGNOSIS — L02214 Cutaneous abscess of groin: Secondary | ICD-10-CM

## 2020-04-15 DIAGNOSIS — Z79899 Other long term (current) drug therapy: Secondary | ICD-10-CM

## 2020-04-15 DIAGNOSIS — L02219 Cutaneous abscess of trunk, unspecified: Secondary | ICD-10-CM

## 2020-04-15 MED ORDER — DOXYCYCLINE HYCLATE 100 MG PO TABS: 100.0000 mg | ORAL_TABLET | Freq: Two times a day (BID) | ORAL | 0 refills | Status: DC

## 2020-04-15 MED ORDER — AMOXICILLIN-POT CLAVULANATE 875-125 MG PO TABS: 1.0000 | ORAL_TABLET | Freq: Two times a day (BID) | ORAL | 0 refills | Status: DC

## 2020-04-15 NOTE — Telephone Encounter (Signed)
Sent!

## 2020-04-15 NOTE — Telephone Encounter (Signed)
Patient requesting letter be sent to his MyChart saying he was seen in our office today for work. Will route to provider.  Sandie Ano, RN

## 2020-04-15 NOTE — Patient Instructions (Signed)
Nice to see you.  Start taking the doxycycline and Augmentin twice daily for 10 days.   Continue with basic wound are with just regular soap and water.   If symptoms worsen or do not improve let me know.  Have a great day and stay safe!

## 2020-04-15 NOTE — Assessment & Plan Note (Signed)
Scott Baxter has 2 pubic abscesses 1 located at the 8 o'clock position and one located at the 1 o'clock position.  There is induration of both with tenderness.  We will attempt antibiotic treatment with doxycycline and Augmentin for broad-spectrum coverage with suspicion for staph or strep origin but given the groin region cannot rule out gram-negative's.  If no improvement with antibiotics may need surgical intervention.  Continue basic wound care with soap and water.  Plan for follow-up if symptoms worsen or do not improve.

## 2020-04-15 NOTE — Progress Notes (Signed)
Subjective:    Patient ID: Scott Baxter, male    DOB: October 20, 1995, 24 y.o.   MRN: [id number]  Chief Complaint  Patient presents with  . Pubic Abscess     HPI:  Scott Baxter is a 24 y.o. male with HIV disease who was last seen in the office on 12/2 with viral load that was undetectable and CD4 count of 760. RPR was positive with titer of 1:16. He is here today for an acute office visit.   Scott Baxter began experiencing a bump/pimple located on his right scrotum approximately 3 months ago that has gradually enlarged since initial onset and has been draining clear discharge.  No odors.  Refractory to basic wound care including warm compresses.  Denies any systemic symptoms of fevers or chills.  There is also a small lesion located on his left pubis.  No Known Allergies    Outpatient Medications Prior to Visit  Medication Sig Dispense Refill  . acetaminophen (TYLENOL) 500 MG tablet Take 1 tablet (500 mg total) by mouth every 6 (six) hours as needed. 30 tablet 0  . albuterol (PROVENTIL HFA;VENTOLIN HFA) 108 (90 Base) MCG/ACT inhaler Inhale 2 puffs into the lungs every 4 (four) hours as needed for wheezing or shortness of breath. 1 Inhaler 0  . bictegravir-emtricitabine-tenofovir AF (BIKTARVY) 50-200-25 MG TABS tablet Take 1 tablet by mouth daily. 30 tablet 5  . fluticasone (FLONASE) 50 MCG/ACT nasal spray Place 1 spray into both nostrils daily. 16 g 2  . glucose blood (CONTOUR NEXT TEST) test strip Use as instructed 300 each 3  . insulin aspart (NOVOLOG FLEXPEN) 100 UNIT/ML FlexPen Use up to 15 units 3x a day 15 mL 3  . Insulin Pen Needle (PEN NEEDLES) 31G X 5 MM MISC 1 Units by Does not apply route 4 (four) times daily -  before meals and at bedtime. 100 each 11  . Lancets MISC 1 Units by Does not apply route 4 (four) times daily -  before meals and at bedtime. 200 each 11  . LEVEMIR FLEXTOUCH 100 UNIT/ML FlexPen Inject 20 Units into the skin at bedtime. IM PROGRAM 15 mL 3  .  lisinopril (ZESTRIL) 20 MG tablet Take 2 tablets (40 mg total) by mouth daily. 60 tablet 2  . loratadine (CLARITIN) 10 MG tablet Take 1 tablet (10 mg total) by mouth daily.     No facility-administered medications prior to visit.     Past Medical History:  Diagnosis Date  . Asthma   . Depression 02/24/2017  . Diabetes mellitus   . HCAP (healthcare-associated pneumonia) 08/31/2014  . HIV disease (HCC) 09/03/2014  . Intrauterine drug exposure   . IUGR (intrauterine growth restriction)   . Obesity   . Proctitis   . Proctitis   . Recurrent boils    MRSA and MSSA  . Vision abnormalities      Past Surgical History:  Procedure Laterality Date  . EXCISIONAL HEMORRHOIDECTOMY    . FOOT SURGERY  1999   ingrown toenail removal    Review of Systems  Constitutional: Negative for chills, diaphoresis, fatigue and fever.  Respiratory: Negative for cough, chest tightness, shortness of breath and wheezing.   Cardiovascular: Negative for chest pain.  Gastrointestinal: Negative for abdominal pain, diarrhea, nausea and vomiting.  Genitourinary:       Positive for abscess      Objective:    BP 135/84 (BP Location: Right Arm)   Pulse 87   Temp 98 F (36.7  C)   Ht 5\' 9"  (1.753 m)   Wt 228 lb (103.4 kg)   BMI 33.67 kg/m  Nursing note and vital signs reviewed.  Physical Exam Constitutional:      General: He is not in acute distress.    Appearance: He is well-developed.  Cardiovascular:     Rate and Rhythm: Normal rate and regular rhythm.     Heart sounds: Normal heart sounds.  Pulmonary:     Effort: Pulmonary effort is normal.     Breath sounds: Normal breath sounds.  Genitourinary:    Comments: Pea-sized lesion with induration and raw skin noted at the 8 o'clock position along the pubis.  There is also a small lesion/nodule around the 1 o'clock position and also has induration and tenderness. Skin:    General: Skin is warm and dry.  Neurological:     Mental Status: He is alert  and oriented to person, place, and time.  Psychiatric:        Behavior: Behavior normal.        Thought Content: Thought content normal.        Judgment: Judgment normal.      Depression screen [facility] 2/9 04/10/2020 04/09/2020 05/14/2019 07/12/2017 11/26/2015  Decreased Interest 2 0 0 0 0  Down, Depressed, Hopeless 3 0 0 0 0  PHQ - 2 Score 5 0 0 0 0  Altered sleeping 3 - - - -  Tired, decreased energy 3 - - - -  Change in appetite 2 - - - -  Feeling bad or failure about yourself  1 - - - -  Trouble concentrating 2 - - - -  Moving slowly or fidgety/restless 2 - - - -  Suicidal thoughts 0 - - - -  PHQ-9 Score 18 - - - -  Difficult doing work/chores Very difficult - - - -       Assessment & Plan:    Patient Active Problem List   Diagnosis Date Noted  . Abscess of pubic region 04/15/2020  . Subungual hematoma of great toe 04/10/2020  . Positive RPR test 04/10/2020  . Healthcare maintenance 05/14/2019  . Screening for STDs (sexually transmitted diseases) 11/14/2018  . Depression 02/24/2017  . Eczema 02/24/2017  . Chlamydia infection 10/29/2016  . Herpes labialis 11/26/2015  . HIV disease (HCC) 09/03/2014  . Gastroparesis 08/30/2014  . Diabetic neuropathy (HCC) 08/30/2014  . Asthma 08/30/2014  . Tobacco abuse 08/17/2014  . Type 1 diabetes, uncontrolled, 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/01/2010  . Goiter, unspecified 09/01/2010  . Hypertension 09/01/2010     Problem List Items Addressed This Visit      Other   Abscess of pubic region - Primary    Scott Baxter has 2 pubic abscesses 1 located at the 8 o'clock position and one located at the 1 o'clock position.  There is induration of both with tenderness.  We will attempt antibiotic treatment with doxycycline and Augmentin for broad-spectrum coverage with suspicion for staph or strep origin but given the groin region cannot rule out gram-negative's.  If no improvement with antibiotics may need surgical intervention.  Continue basic wound  care with soap and water.  Plan for follow-up if symptoms worsen or do not improve.          I am having Scott Baxter start on doxycycline and amoxicillin-clavulanate. I am also having him maintain his albuterol, loratadine, fluticasone, acetaminophen, Biktarvy, NovoLOG FlexPen, Pen Needles, Lancets, Levemir FlexTouch, Contour Next Test, and lisinopril.  Meds ordered this encounter  Medications  . doxycycline (VIBRA-TABS) 100 MG tablet    Sig: Take 1 tablet (100 mg total) by mouth 2 (two) times daily.    Dispense:  20 tablet    Refill:  0    Order Specific Question:   Supervising Provider    Answer:   Judyann Munson [[id number]]  . amoxicillin-clavulanate (AUGMENTIN) 875-125 MG tablet    Sig: Take 1 tablet by mouth 2 (two) times daily.    Dispense:  20 tablet    Refill:  0    Order Specific Question:   Supervising Provider    Answer:   Judyann Munson [[id number]]     Follow-up: Return if symptoms worsen or fail to improve.   Marcos Eke, MSN, FNP-C Nurse Practitioner [facility] Main number: [phone]

## 2020-04-16 NOTE — Chronic Care Management (AMB) (Signed)
°  Care Management   Note  04/16/2020 Name: WOLF BOULAY MRN: [id number] DOB: [birth year]  BREAKER SPRINGER is a 24 y.o. year old male who is a primary care patient of Katsadouros, Heidi Dach, MD. I reached out to [name] by phone today in response to a referral sent by Mr. Fritzi Mandes R Holliman's PCP,Katsadouros, Vasilios, MD .    Mr. Ander was given information about care management services today including:  1. Care management services include personalized support from designated clinical staff supervised by his physician, including individualized plan of care and coordination with other care providers 2. 24/7 contact phone numbers for assistance for urgent and routine care needs. 3. The patient may stop care management services at any time by phone call to the office staff.  Patient agreed to services and verbal consent obtained.   Follow up plan: Telephone appointment with care management team member scheduled for: 04/17/2020  [facility] Guide, Embedded Care Coordination [facility] Management

## 2020-04-17 ENCOUNTER — Ambulatory Visit: Payer: Medicaid Other

## 2020-04-17 ENCOUNTER — Ambulatory Visit: Payer: Medicaid Other | Admitting: *Deleted

## 2020-04-17 DIAGNOSIS — IMO0002 Reserved for concepts with insufficient information to code with codable children: Secondary | ICD-10-CM

## 2020-04-17 DIAGNOSIS — B20 Human immunodeficiency virus [HIV] disease: Secondary | ICD-10-CM

## 2020-04-17 DIAGNOSIS — I10 Essential (primary) hypertension: Secondary | ICD-10-CM

## 2020-04-17 DIAGNOSIS — E1043 Type 1 diabetes mellitus with diabetic autonomic (poly)neuropathy: Secondary | ICD-10-CM

## 2020-04-17 DIAGNOSIS — U071 COVID-19: Secondary | ICD-10-CM

## 2020-04-17 NOTE — Patient Instructions (Signed)
Visit Information  Patient Care Plan: Social Work Plan of Care    Problem Identified: Barriers to Treatment     Long-Range Goal: Barriers to Treatment; limited income and insufficient insurance   Start Date: 04/17/2020  Expected End Date: 11/06/2020  This Visit's Progress: On track  Note:   Current Barriers:  . Significant financial constraints.  Patient currently working part-time at [employer].  Trying to save money to fix car problems so that he has more reliable transportation and can possibly find better paying job.  States that he is usually at friends house when not working but has to sleep in car.  Has  [facility] which is insufficient coverage.  Does not qualify for Medicaid.  Receives $234/month of food stamps.   Case Manager Clinical Goal(s):  Marland Kitchen Over the next 180 days, patient will work with BSW to address needs related to Financial constraints related to limited income and Housing barriers . Over the next 180 days, BSW will collaborate with RN Care Manager to address care management and care coordination needs  Interventions:  . Patient interviewed and appropriate assessments performed . Collaborated with RN Care Manager and patient to establish an individualized plan of care  . Gained consent to submit referral to [employer] . Referral submitted via [place] [employer] platform . Advised patient to report to [facility] center between the hours of 9AM and 1PM Monday-Friday to complete intake for hotel program . Mailed list of emergency shelters and food pantries . Submitted referral to CPhT, Mardee Postin, for possible assistance with cost of diabetic medications .    Patient Self Care Activities:  . Patient will self administer medications as prescribed . Patient will attend all scheduled provider appointments . Patient will call provider office for new concerns or questions . Patient will work with BSW to address care coordination  needs and will continue to work with the clinical team to address health care and disease management related needs.    Please see past updates related to this goal by clicking on the "Past Updates" button in the selected goal     Patient Care Plan: Diabetes Type 1 (Adult)- not well controlled due to socioeconomic factors    Problem Identified: Glycemic Management (Diabetes, Type 1)   Priority: High    Problem Identified: Disease Progression (Diabetes, Type 1)     Goal: Glycemic Management Optimized- improved Hgb A1C in next 3 months   Start Date: 04/17/2020  Expected End Date: 07/16/2020  This Visit's Progress: On track  Priority: High    Task: Alleviate socioeconomic Barriers to Glycemic Management   Due Date: 04/17/2020  Priority: Today  Note:   Current Barriers:  . Chronic Disease Management support, education, and care coordination needs related to HTN and Type1 DM and HIV . Unable to independently secure stable housing Case Manager Clinical Goal(s):  Marland Kitchen Over the next 30-90 days, patient will work with BSW to address needs related to  housing  in patient with HTN and Type1 DM and HIV Interventions:  . Collaborated with BSW to initiate plan of care to address needs related to Lacks knowledge of community resource: and stable housing options  in patient with HTN and Type 1 DM and HIV Patient Goals/Self-Care Activities . Over the next 30-60 days, patient will: work with CCM BSW to investigate housing options  - Patient will self administer medications as prescribed Patient will attend all scheduled provider appointments Patient will call pharmacy for medication refills Patient  will continue to perform ADL's independently Patient will continue to perform IADL's independently Patient will call provider office for new concerns or questions Patient will work with BSW to address care coordination needs and will continue to work with the clinical team to address health care and disease  management related needs.   Follow Up Plan: The care management team will reach out to the patient again over the next 7-14 days.       Mr. Loken was given information about Care Management services today including:  1. Care Management services include personalized support from designated clinical staff supervised by his physician, including individualized plan of care and coordination with other care providers 2. 24/7 contact phone numbers for assistance for urgent and routine care needs. 3. The patient may stop CCM services at any time (effective at the end of the month) by phone call to the office staff.  Patient agreed to services and verbal consent obtained.   The patient verbalized understanding of instructions, educational materials, and care plan provided today and declined offer to receive copy of patient instructions, educational materials, and care plan.   The care management team will reach out to the patient again over the next 7-14 days.   Cranford Mon RN, CCM, CDCES CCM Clinic RN Care Manager [phone]

## 2020-04-17 NOTE — Progress Notes (Signed)
Internal Medicine Clinic Resident  I have personally reviewed this encounter including the documentation in this note and/or discussed this patient with the care management provider. I will address any urgent items identified by the care management provider and will communicate my actions to the patient's PCP. I have reviewed the patient's CCM visit with my supervising attending, Dr Vincent.  Matt Jarmal Lewelling, MD 04/17/2020   

## 2020-04-17 NOTE — Chronic Care Management (AMB) (Signed)
Care Management   Initial Visit Note  04/17/2020 Name: Scott Baxter MRN: [id number] DOB: 1995-05-29  Scott Baxter is enrolled in a Managed Medicaid plan: No. Outreach attempt today was successful. By CCM BSW     Assessment: Scott Baxter is a 24 y.o. year old male who sees [name], MD for primary care. The care management team was consulted for assistance with care management and care coordination needs related to Care Coordination and Other housing resources.   Review of patient status, including review of consultants reports, relevant laboratory and other test results, and collaboration with appropriate care team members and the patient's provider was performed as part of comprehensive patient evaluation and provision of care management services.    SDOH (Social Determinants of Health) screening performed today. See Care Plan Entry related to challenges with: Housing   Patient Care Plan: Social Work Plan of Care    Problem Identified: Barriers to Treatment     Long-Range Goal: Barriers to Treatment; limited income and insufficient insurance   Start Date: 04/17/2020  Expected End Date: 11/06/2020  This Visit's Progress: On track  Note:   Current Barriers:  . Significant financial constraints.  Patient currently working part-time at [employer].  Trying to save money to fix car problems so that he has more reliable transportation and can possibly find better paying job.  States that he is usually at friends house when not working but has to sleep in car.  Has  [facility] which is insufficient coverage.  Does not qualify for Medicaid.  Receives $234/month of food stamps.   Case Manager Clinical Goal(s):  Scott Baxter Kitchen Over the next 180 days, patient will work with BSW to address needs related to Financial constraints related to limited income and Housing barriers . Over the next 180 days, BSW will collaborate with RN Care Manager to address care management  and care coordination needs  Interventions:  . Patient interviewed and appropriate assessments performed . Collaborated with RN Care Manager and patient to establish an individualized plan of care  . Gained consent to submit referral to [employer] . Referral submitted via [place] [facility] platform . Advised patient to report to [facility] between the hours of 9AM and 1PM Monday-Friday to complete intake for hotel program . Mailed list of emergency shelters and food pantries . Submitted referral to CPhT, Mardee Postin, for possible assistance with cost of diabetic medications .    Patient Self Care Activities:  . Patient will self administer medications as prescribed . Patient will attend all scheduled provider appointments . Patient will call provider office for new concerns or questions . Patient will work with BSW to address care coordination needs and will continue to work with the clinical team to address health care and disease management related needs.    Please see past updates related to this goal by clicking on the "Past Updates" button in the selected goal     Patient Care Plan: Diabetes Type 1 (Adult)- not well controlled due to socioeconomic factors    Problem Identified: Glycemic Management (Diabetes, Type 1)   Priority: High    Problem Identified: Disease Progression (Diabetes, Type 1)     Goal: Glycemic Management Optimized- improved Hgb A1C in next 3 months   Start Date: 04/17/2020  Expected End Date: 07/16/2020  This Visit's Progress: On track  Priority: High    Task: Alleviate socioeconomic Barriers to Glycemic Management   Due Date: 04/17/2020  Priority: Today  Note:   Current Barriers:  . Chronic Disease Management support, education, and care coordination needs related to HTN and Type1 DM and HIV . Unable to independently secure stable housing Case Manager Clinical Goal(s):  Scott Baxter Kitchen Over the next 30-90 days, patient will work with BSW to  address needs related to  housing  in patient with HTN and Type1 DM and HIV Interventions:  . Collaborated with BSW to initiate plan of care to address needs related to Lacks knowledge of community resource: and stable housing options  in patient with HTN and Type 1 DM and HIV Patient Goals/Self-Care Activities . Over the next 30-60 days, patient will: work with CCM BSW to investigate housing options  - Patient will self administer medications as prescribed Patient will attend all scheduled provider appointments Patient will call pharmacy for medication refills Patient will continue to perform ADL's independently Patient will continue to perform IADL's independently Patient will call provider office for new concerns or questions Patient will work with BSW to address care coordination needs and will continue to work with the clinical team to address health care and disease management related needs.   Follow Up Plan: The care management team will reach out to the patient again over the next 7-14 days.        Follow up plan:  The care management team will reach out to the patient again over the next 7-14 days.   Mr. Verrette was given information about Care Management services today including:  1. Care Management services include personalized support from designated clinical staff supervised by a physician, including individualized plan of care and coordination with other care providers 2. 24/7 contact phone numbers for assistance for urgent and routine care needs. 3. The patient may stop CCM services at any time (effective at the end of the month) by phone call to the office staff.  Patient agreed to services and verbal consent obtained.  Cranford Mon RN, CCM, CDCES CCM Clinic RN Care Manager [phone]

## 2020-04-18 NOTE — Patient Instructions (Addendum)
Visit Information  Patient Care Plan: Social Work Plan of Care    Problem Identified: Barriers to Treatment     Long-Range Goal: Barriers to Treatment; limited income and insufficient insurance   Start Date: 04/17/2020  Expected End Date: 11/06/2020  This Visit's Progress: On track  Note:   Current Barriers:  . Significant financial constraints.  Patient currently working part-time at [employer].  Trying to save money to fix car problems so that he has more reliable transportation and can possibly find better paying job.  States that he is usually at friends house when not working but has to sleep in car.  Has  [facility] which is insufficient coverage.  Does not qualify for Medicaid.  Receives $234/month of food stamps.   Case Manager Clinical Goal(s):  Marland Kitchen Over the next 180 days, patient will work with BSW to address needs related to Financial constraints related to limited income and Housing barriers . Over the next 180 days, BSW will collaborate with RN Care Manager to address care management and care coordination needs  Interventions:  . Patient interviewed and appropriate assessments performed . Collaborated with RN Care Manager and patient to establish an individualized plan of care  . Gained consent to submit referral to [employer] . Referral submitted via [place] [facility] platform . Advised patient to report to [facility] center between the hours of 9AM and 1PM Monday-Friday to complete intake for hotel program . Mailed list of emergency shelters and food pantries . Submitted referral to CPhT, Mardee Postin, for possible assistance with cost of diabetic medications .    Patient Self Care Activities:  . Patient will self administer medications as prescribed . Patient will attend all scheduled provider appointments . Patient will call provider office for new concerns or questions . Patient will work with BSW to address care coordination  needs and will continue to work with the clinical team to address health care and disease management related needs.    Initial goal documentation    Patient Care Plan: Diabetes Type 1 (Adult)- not well controlled due to socioeconomic factors    Problem Identified: Glycemic Management (Diabetes, Type 1)   Priority: High    Problem Identified: Disease Progression (Diabetes, Type 1)     Goal: Glycemic Management Optimized- improved Hgb A1C in next 3 months   Start Date: 04/17/2020  Expected End Date: 07/16/2020  This Visit's Progress: On track  Priority: High    Task: Alleviate socioeconomic Barriers to Glycemic Management   Due Date: 04/17/2020  Priority: Today  Note:   Current Barriers:  . Chronic Disease Management support, education, and care coordination needs related to HTN and Type1 DM and HIV . Unable to independently secure stable housing Case Manager Clinical Goal(s):  Marland Kitchen Over the next 30-90 days, patient will work with BSW to address needs related to  housing  in patient with HTN and Type1 DM and HIV Interventions:  . Collaborated with BSW to initiate plan of care to address needs related to Lacks knowledge of community resource: and stable housing options  in patient with HTN and Type 1 DM and HIV Patient Goals/Self-Care Activities . Over the next 30-60 days, patient will: work with CCM BSW to investigate housing options  - Patient will self administer medications as prescribed Patient will attend all scheduled provider appointments Patient will call pharmacy for medication refills Patient will continue to perform ADL's independently Patient will continue to perform IADL's independently Patient will call provider  office for new concerns or questions Patient will work with BSW to address care coordination needs and will continue to work with the clinical team to address health care and disease management related needs.   Follow Up Plan: The care management team will reach out  to the patient again over the next 7-14 days.        Scott Baxter was given information about Care Management services today including:  1. Care Management services include personalized support from designated clinical staff supervised by his physician, including individualized plan of care and coordination with other care providers 2. 24/7 contact phone numbers for assistance for urgent and routine care needs. 3. The patient may stop CCM services at any time (effective at the end of the month) by phone call to the office staff.  Patient agreed to services and verbal consent obtained.   The patient verbalized understanding of instructions, educational materials, and care plan provided today and declined offer to receive copy of patient instructions, educational materials, and care plan.   The care management team will reach out to the patient again over the next 14 days.    [name], BSW Embedded Care Coordination Social Worker [facility] [phone]

## 2020-04-18 NOTE — Progress Notes (Signed)
Internal Medicine Clinic Resident  I have personally reviewed this encounter including the documentation in this note and/or discussed this patient with the care management provider. I will address any urgent items identified by the care management provider and will communicate my actions to the patient's PCP. I have reviewed the patient's CCM visit with my supervising attending, Dr  Mayford Knife .  Ames Dura, MD 04/18/2020

## 2020-04-18 NOTE — Progress Notes (Signed)
Internal Medicine Clinic Attending  CCM services provided by the care management provider and their documentation were discussed with Dr.  Johnson . We reviewed the pertinent findings, urgent action items addressed by the resident and non-urgent items to be addressed by the PCP.  I agree with the assessment, diagnosis, and plan of care documented in the CCM and resident's note.  Robb Sibal Thomas Tenesha Garza, MD 04/18/2020  

## 2020-04-18 NOTE — Chronic Care Management (AMB) (Signed)
Care Management    04/18/2020 Name: Scott Baxter MRN: [id number] DOB: 01/28/1996  Referred by: [name], MD Reason for referral : Care Coordination   Scott Baxter is a 24 y.o. year old male who is a primary care patient of Katsadouros, Heidi Dach, MD. The care management team was consulted for assistance with chronic disease management and care coordination needs.   Review of patient status, including review of consultants reports, relevant laboratory and other test results, and collaboration with appropriate care team members and the patient's provider was performed as part of comprehensive patient evaluation and provision of care management services.    SDOH (Social Determinants of Health) assessments performed: Yes See Care Plan activities for detailed interventions related to SDOH)  SDOH Interventions   Flowsheet Row Most Recent Value  SDOH Interventions   SDOH Interventions for the Following Domains Housing, Food Insecurity  Food Insecurity Interventions Other (Comment)  [mailed list of food pantries]  Housing Interventions --  [mailed list of emergency shelters]       Patient Care Plan: Social Work Plan of Care    Problem Identified: Barriers to Treatment     Long-Range Goal: Barriers to Treatment; limited income and insufficient insurance   Start Date: 04/17/2020  Expected End Date: 11/06/2020  This Visit's Progress: On track  Note:   Current Barriers:  . Significant financial constraints.  Patient currently working part-time at [employer].  Trying to save money to fix car problems so that he has more reliable transportation and can possibly find better paying job.  States that he is usually at friends house when not working but has to sleep in car.  Has  [facility] which is insufficient coverage.  Does not qualify for Medicaid.  Receives $234/month of food stamps.   Case Manager Clinical Goal(s):  Marland Kitchen Over the next 180 days, patient  will work with BSW to address needs related to Financial constraints related to limited income and Housing barriers . Over the next 180 days, BSW will collaborate with RN Care Manager to address care management and care coordination needs  Interventions:  . Patient interviewed and appropriate assessments performed . Collaborated with RN Care Manager and patient to establish an individualized plan of care  . Gained consent to submit referral to [employer] . Referral submitted via [place] [facility] platform . Advised patient to report to [facility] center between the hours of 9AM and 1PM Monday-Friday to complete intake for hotel program . Mailed list of emergency shelters and food pantries . Submitted referral to CPhT, Mardee Postin, for possible assistance with cost of diabetic medications .    Patient Self Care Activities:  . Patient will self administer medications as prescribed . Patient will attend all scheduled provider appointments . Patient will call provider office for new concerns or questions . Patient will work with BSW to address care coordination needs and will continue to work with the clinical team to address health care and disease management related needs.    Please see past updates related to this goal by clicking on the "Past Updates" button in the selected goal     Patient Care Plan: Diabetes Type 1 (Adult)- not well controlled due to socioeconomic factors    Problem Identified: Glycemic Management (Diabetes, Type 1)   Priority: High    Problem Identified: Disease Progression (Diabetes, Type 1)     Goal: Glycemic Management Optimized- improved Hgb A1C in next 3 months   Start Date: 04/17/2020  Expected End Date: 07/16/2020  This Visit's Progress: On track  Priority: High    Task: Alleviate socioeconomic Barriers to Glycemic Management   Due Date: 04/17/2020  Priority: Today  Note:   Current Barriers:  . Chronic Disease Management support,  education, and care coordination needs related to HTN and Type1 DM and HIV . Unable to independently secure stable housing Case Manager Clinical Goal(s):  Marland Kitchen Over the next 30-90 days, patient will work with BSW to address needs related to  housing  in patient with HTN and Type1 DM and HIV Interventions:  . Collaborated with BSW to initiate plan of care to address needs related to Lacks knowledge of community resource: and stable housing options  in patient with HTN and Type 1 DM and HIV Patient Goals/Self-Care Activities . Over the next 30-60 days, patient will: work with CCM BSW to investigate housing options  - Patient will self administer medications as prescribed Patient will attend all scheduled provider appointments Patient will call pharmacy for medication refills Patient will continue to perform ADL's independently Patient will continue to perform IADL's independently Patient will call provider office for new concerns or questions Patient will work with BSW to address care coordination needs and will continue to work with the clinical team to address health care and disease management related needs.   Follow Up Plan: The care management team will reach out to the patient again over the next 7-14 days.            Scott Baxter was given information about Care Management services today including:  1. Care Management services includes personalized support from designated clinical staff supervised by his physician, including individualized plan of care and coordination with other care providers 2. 24/7 contact phone numbers for assistance for urgent and routine care needs. 3. The patient may stop case management services at any time by phone call to the office staff.  Patient agreed to services and verbal consent obtained.    Follow up plan: The care management team will reach out to the patient again over the next 14 days.     [name], BSW Embedded Care Coordination Social  Worker [facility] [phone]

## 2020-04-21 NOTE — Progress Notes (Signed)
Internal Medicine Clinic Attending  Case discussed with Dr. Laural Benes.   We reviewed the AWV findings.  I agree with the assessment, diagnosis, and plan of care documented in the AWV note.

## 2020-04-22 NOTE — Progress Notes (Signed)
Internal Medicine Clinic Attending  Case discussed with Dr. Agyei at the time of the visit.  We reviewed the resident's history and exam and pertinent patient test results.  I agree with the assessment, diagnosis, and plan of care documented in the resident's note.  Severiano Utsey, M.D., Ph.D.  

## 2020-04-24 ENCOUNTER — Telehealth: Payer: Medicaid Other

## 2020-04-24 ENCOUNTER — Telehealth: Payer: Self-pay | Admitting: *Deleted

## 2020-04-24 NOTE — Telephone Encounter (Signed)
  Care Management   Outreach Note  04/24/2020 Name: Scott Baxter MRN: [id number] DOB: 1995-05-26  Referred by: [name], MD Reason for referral : Care Coordination (T1DM, HTN, tobacco use, asthma, HIV, depression, eczema, COVID)   Scott Baxter is enrolled in a Managed Medicaid Health Plan: No  An unsuccessful telephone outreach was attempted today to complete initial intake schedule today at 3:00 pm. The patient was referred to the case management team for assistance with care management and care coordination.   Follow Up Plan: A HIPAA compliant phone message was left for the patient providing contact information and requesting a return call.  The care management team will reach out to the patient again over the next 7-14 days.   Cranford Mon RN, CCM, CDCES CCM Clinic RN Care Manager [phone]

## 2020-04-25 ENCOUNTER — Telehealth: Payer: Self-pay

## 2020-04-25 ENCOUNTER — Telehealth: Payer: Medicaid Other

## 2020-04-25 NOTE — Telephone Encounter (Signed)
  Chronic Care Management   Outreach Note  04/25/2020 Name: Scott Baxter MRN: [id number] DOB: 07-23-1995  Referred by: [name], MD Reason for referral : Care Coordination   Scott Baxter is enrolled in a Managed Medicaid Health Plan: No  An unsuccessful telephone outreach was attempted today. The patient was referred to the case management team for assistance with care management and care coordination.   Follow Up Plan: A HIPAA compliant phone message was left for the patient providing contact information and requesting a return call.      [name], BSW Embedded Care Coordination Social Worker [facility] [phone]

## 2020-04-28 ENCOUNTER — Telehealth: Payer: Self-pay | Admitting: *Deleted

## 2020-04-28 ENCOUNTER — Telehealth: Payer: Medicaid Other | Admitting: *Deleted

## 2020-04-28 NOTE — Telephone Encounter (Signed)
  Care Management   Outreach Note  04/28/2020 Name: Scott Baxter MRN: [id number] DOB: March 10, 1996  Referred by: [name], MD Reason for referral : Care Coordination ( T1DM, HTN, tobacco use, asthma, HIV, depression, eczema, COVID)   Scott Baxter is enrolled in a Managed Medicaid Health Plan: No  A second unsuccessful telephone outreach was attempted today. The patient was referred to the case management team for assistance with care management and care coordination.   Follow Up Plan: The care management team will reach out to the patient again over the next 7-14 days.   Cranford Mon RN, CCM, CDCES CCM Clinic RN Care Manager [phone]

## 2020-04-29 ENCOUNTER — Ambulatory Visit: Payer: Medicaid Other

## 2020-04-29 ENCOUNTER — Telehealth: Payer: Medicaid Other

## 2020-04-29 ENCOUNTER — Telehealth: Payer: Self-pay

## 2020-04-29 NOTE — Telephone Encounter (Signed)
  Chronic Care Management   Outreach Note  04/29/2020 Name: MILOH Baxter MRN: [id number] DOB: 04-10-1996  Referred by: [name], MD Reason for referral : Care Coordination   Scott Baxter is enrolled in a Managed Medicaid Health Plan: No  A second unsuccessful telephone outreach was attempted today. The patient was referred to the case management team for assistance with care management and care coordination.   Follow Up Plan: A HIPAA compliant phone message was left for the patient providing contact information and requesting a return call.       [name], BSW Embedded Care Coordination Social Worker [facility] [phone]

## 2020-04-30 ENCOUNTER — Telehealth: Payer: Medicaid Other

## 2020-04-30 ENCOUNTER — Telehealth: Payer: Self-pay

## 2020-04-30 NOTE — Telephone Encounter (Signed)
  Chronic Care Management   Outreach Note  04/30/2020 Name: Scott Baxter MRN: [id number] DOB: August 04, 1995  Referred by: [name], MD Reason for referral : Care Coordination   ADITH TEJADA is enrolled in a Managed Medicaid Health Plan: No  Third unsuccessful telephone outreach was attempted today. The patient was referred to the case management team for assistance with care management and care coordination.    Follow Up Plan: RNCM, Cranford Mon scheduled to contact patient on 05/06/20.  Requested that she have patient call CCM BSW if she connects with patient.      [name], BSW Embedded Care Coordination Social Worker [facility] [phone]

## 2020-05-01 ENCOUNTER — Ambulatory Visit: Payer: Self-pay

## 2020-05-06 ENCOUNTER — Ambulatory Visit: Payer: Medicaid Other | Admitting: *Deleted

## 2020-05-06 DIAGNOSIS — U071 COVID-19: Secondary | ICD-10-CM

## 2020-05-06 DIAGNOSIS — I10 Essential (primary) hypertension: Secondary | ICD-10-CM

## 2020-05-06 DIAGNOSIS — B20 Human immunodeficiency virus [HIV] disease: Secondary | ICD-10-CM

## 2020-05-06 DIAGNOSIS — IMO0002 Reserved for concepts with insufficient information to code with codable children: Secondary | ICD-10-CM

## 2020-05-06 NOTE — Chronic Care Management (AMB) (Signed)
°  Care Management   Outreach Note  05/06/2020 Name: Scott Baxter MRN: [id number] DOB: Nov 11, 1995  Referred by: [name], MD Reason for referral : Care Coordination (T1DM, HTN, tobacco use, asthma, HIV, depression, eczema, s/p COVID)   Scott Baxter is enrolled in a Managed Medicaid Health Plan: No  Third unsuccessful telephone outreach was attempted today. The patient was referred to the case management team for assistance with care management and care coordination. The patient's primary care provider has been notified of our unsuccessful attempts to make or maintain contact with the patient. The care management team is pleased to engage with this patient at any time in the future should he/she be interested in assistance from the care management team.   Follow Up Plan: No further follow up required: closing referral as unable to reach patient  Cranford Mon RN, CCM, CDCES CCM Clinic RN Care Manager [phone]

## 2020-05-14 NOTE — Addendum Note (Signed)
Addended by: Neomia Dear on: 05/14/2020 02:43 PM   Modules accepted: Orders

## 2020-05-23 MED FILL — LEVEMIR FLEXTOUCH 100 UNITS: 100 | 30 days supply | Qty: 6 | Fill #1

## 2020-05-23 MED FILL — NOVOLOG FLEXPEN SYRINGE: 100 | 26 days supply | Qty: 12 | Fill #1

## 2020-06-02 ENCOUNTER — Other Ambulatory Visit (HOSPITAL_COMMUNITY): Payer: Self-pay

## 2020-06-17 ENCOUNTER — Other Ambulatory Visit: Payer: Medicaid Other

## 2020-06-25 ENCOUNTER — Ambulatory Visit: Payer: Self-pay

## 2020-06-25 ENCOUNTER — Other Ambulatory Visit: Payer: Self-pay

## 2020-06-25 DIAGNOSIS — Z79899 Other long term (current) drug therapy: Secondary | ICD-10-CM

## 2020-06-25 DIAGNOSIS — Z113 Encounter for screening for infections with a predominantly sexual mode of transmission: Secondary | ICD-10-CM

## 2020-06-25 DIAGNOSIS — B20 Human immunodeficiency virus [HIV] disease: Secondary | ICD-10-CM

## 2020-06-26 LAB — URINE CYTOLOGY ANCILLARY ONLY
Chlamydia: NEGATIVE
Comment: NEGATIVE
Comment: NORMAL
Neisseria Gonorrhea: NEGATIVE

## 2020-06-26 LAB — T-HELPER CELL (CD4) - (RCID CLINIC ONLY)
CD4 % Helper T Cell: 34 % (ref 33–65)
CD4 T Cell Abs: 812 /uL (ref 400–1790)

## 2020-06-27 LAB — COMPLETE METABOLIC PANEL WITH GFR
AG Ratio: 1.4 (calc) (ref 1.0–2.5)
ALT: 20 U/L (ref 9–46)
AST: 16 U/L (ref 10–40)
Albumin: 4.6 g/dL (ref 3.6–5.1)
Alkaline phosphatase (APISO): 72 U/L (ref 36–130)
BUN: 15 mg/dL (ref 7–25)
CO2: 26 mmol/L (ref 20–32)
Calcium: 9.8 mg/dL (ref 8.6–10.3)
Chloride: 103 mmol/L (ref 98–110)
Creat: 0.99 mg/dL (ref 0.60–1.35)
GFR, Est African American: 123 mL/min/{1.73_m2} (ref >=60)
GFR, Est Non African American: 106 mL/min/{1.73_m2} (ref >=60)
Globulin: 3.3 g/dL (calc) (ref 1.9–3.7)
Glucose, Bld: 125 mg/dL — ABNORMAL HIGH (ref 65–99)
Potassium: 4.4 mmol/L (ref 3.5–5.3)
Sodium: 138 mmol/L (ref 135–146)
Total Bilirubin: 0.7 mg/dL (ref 0.2–1.2)
Total Protein: 7.9 g/dL (ref 6.1–8.1)

## 2020-06-27 LAB — LIPID PANEL
Cholesterol: 157 mg/dL (ref <200)
HDL: 51 mg/dL (ref >=40)
LDL Cholesterol (Calc): 93 mg/dL (calc)
Non-HDL Cholesterol (Calc): 106 mg/dL (calc) (ref <130)
Total CHOL/HDL Ratio: 3.1 (calc) (ref <5.0)
Triglycerides: 45 mg/dL (ref <150)

## 2020-06-27 LAB — CBC WITH DIFFERENTIAL/PLATELET
Absolute Monocytes: 435 cells/uL (ref 200–950)
Basophils Absolute: 29 cells/uL (ref 0–200)
Basophils Relative: 0.5 %
Eosinophils Absolute: 29 cells/uL (ref 15–500)
Eosinophils Relative: 0.5 %
HCT: 49.7 % (ref 38.5–50.0)
Hemoglobin: 16.5 g/dL (ref 13.2–17.1)
Lymphs Abs: 2714 cells/uL (ref 850–3900)
MCH: 28.2 pg (ref 27.0–33.0)
MCHC: 33.2 g/dL (ref 32.0–36.0)
MCV: 85 fL (ref 80.0–100.0)
MPV: 12.4 fL (ref 7.5–12.5)
Monocytes Relative: 7.5 %
Neutro Abs: 2593 cells/uL (ref 1500–7800)
Neutrophils Relative %: 44.7 %
Platelets: 212 10*3/uL (ref 140–400)
RBC: 5.85 10*6/uL — ABNORMAL HIGH (ref 4.20–5.80)
RDW: 12.9 % (ref 11.0–15.0)
Total Lymphocyte: 46.8 %
WBC: 5.8 10*3/uL (ref 3.8–10.8)

## 2020-06-27 LAB — HIV-1 RNA QUANT-NO REFLEX-BLD
HIV 1 RNA Quant: <20 Copies/mL
HIV-1 RNA Quant, Log: <1.3 Log cps/mL

## 2020-06-30 ENCOUNTER — Encounter: Payer: Self-pay | Admitting: Student

## 2020-06-30 ENCOUNTER — Telehealth: Payer: Self-pay | Admitting: Student

## 2020-06-30 MED FILL — LEVEMIR FLEXTOUCH 100 UNITS: 100 | 30 days supply | Qty: 6 | Fill #2

## 2020-06-30 MED FILL — NOVOLOG FLEXPEN SYRINGE: 100 | 26 days supply | Qty: 12 | Fill #2

## 2020-06-30 NOTE — Telephone Encounter (Signed)
Attempted to contact patient, but no answer. Left detailed message asking to please give our office a call back to schedule an appointment. Will also send letter requesting the same.  Forwarding back to PCP.

## 2020-06-30 NOTE — Telephone Encounter (Signed)
-----   Message from [name], MD sent at 06/26/2020 11:02 AM EST ----- Regarding: A1c recheck Good Morning,   Can we schedule for Mr. Scott Baxter to be seen next month for an A1c recheck ?  Thank you,  Dr. Kirtland Bouchard.

## 2020-06-30 NOTE — Telephone Encounter (Signed)
Thank you :)

## 2020-07-01 ENCOUNTER — Encounter: Payer: Medicaid Other | Admitting: Family

## 2020-08-04 MED FILL — NOVOLOG FLEXPEN SYRINGE: 100 | 26 days supply | Qty: 12 | Fill #3

## 2020-08-04 MED FILL — LEVEMIR FLEXTOUCH 100 UNITS: 100 | 30 days supply | Qty: 6 | Fill #3

## 2020-08-08 ENCOUNTER — Encounter: Payer: Self-pay | Admitting: Family

## 2020-09-05 ENCOUNTER — Other Ambulatory Visit (HOSPITAL_COMMUNITY): Payer: Self-pay

## 2020-09-05 MED FILL — Insulin Detemir Soln Pen-injector 100 Unit/ML: SUBCUTANEOUS | 30 days supply | Qty: 6 | Fill #0 | Status: AC

## 2020-09-05 MED FILL — Insulin Pen Needle 31 G X 5 MM (1/5" or 3/16"): 25 days supply | Qty: 100 | Fill #0 | Status: AC

## 2020-09-05 MED FILL — Insulin Aspart Soln Pen-injector 100 Unit/ML: SUBCUTANEOUS | 26 days supply | Qty: 12 | Fill #0 | Status: AC

## 2020-09-08 ENCOUNTER — Encounter: Payer: Self-pay | Admitting: Dietician

## 2020-09-09 ENCOUNTER — Telehealth: Payer: Self-pay | Admitting: *Deleted

## 2020-09-09 ENCOUNTER — Encounter: Payer: Medicaid Other | Admitting: Student

## 2020-09-09 NOTE — Telephone Encounter (Signed)
CALLED PATIENT LEFT VOICE MESSAGE REGARDING HIS MISSED APPOINTMENT. LVM FOR PATIENT TO CALL THE CLINIC AT [phone] TO RESCHEDULE THIS APPOINTMENT.

## 2020-10-17 ENCOUNTER — Other Ambulatory Visit: Payer: Self-pay | Admitting: Internal Medicine

## 2020-10-17 ENCOUNTER — Other Ambulatory Visit (HOSPITAL_COMMUNITY): Payer: Self-pay

## 2020-10-17 DIAGNOSIS — IMO0002 Reserved for concepts with insufficient information to code with codable children: Secondary | ICD-10-CM

## 2020-10-17 MED FILL — Insulin Detemir Soln Pen-injector 100 Unit/ML: SUBCUTANEOUS | 30 days supply | Qty: 6 | Fill #1 | Status: AC

## 2020-10-17 MED FILL — Lisinopril Tab 20 MG: ORAL | 30 days supply | Qty: 60 | Fill #0 | Status: AC

## 2020-10-20 ENCOUNTER — Other Ambulatory Visit: Payer: Self-pay | Admitting: Student

## 2020-10-20 ENCOUNTER — Other Ambulatory Visit: Payer: Self-pay | Admitting: Family

## 2020-10-20 ENCOUNTER — Other Ambulatory Visit (HOSPITAL_COMMUNITY): Payer: Self-pay

## 2020-10-20 DIAGNOSIS — B20 Human immunodeficiency virus [HIV] disease: Secondary | ICD-10-CM

## 2020-10-20 DIAGNOSIS — IMO0002 Reserved for concepts with insufficient information to code with codable children: Secondary | ICD-10-CM

## 2020-10-20 MED ORDER — NOVOLOG FLEXPEN 100 UNIT/ML ~~LOC~~ SOPN: 15.0000 [IU] | PEN_INJECTOR | Freq: Three times a day (TID) | SUBCUTANEOUS | 3 refills | Status: DC

## 2020-10-20 MED ORDER — NOVOLOG FLEXPEN 100 UNIT/ML ~~LOC~~ SOPN
15.0000 [IU] | PEN_INJECTOR | Freq: Three times a day (TID) | SUBCUTANEOUS | 3 refills | Status: DC
  Filled 2020-10-20: qty 15, fill #0

## 2020-11-11 ENCOUNTER — Encounter: Payer: Self-pay | Admitting: *Deleted

## 2020-12-09 ENCOUNTER — Emergency Department (HOSPITAL_COMMUNITY)
Admission: EM | Admit: 2020-12-09 | Discharge: 2020-12-09 | Disposition: A | Discharge status: Home / Self Care | Payer: Self-pay | Attending: Emergency Medicine | Admitting: Emergency Medicine

## 2020-12-09 ENCOUNTER — Emergency Department (HOSPITAL_COMMUNITY): Payer: Self-pay

## 2020-12-09 ENCOUNTER — Other Ambulatory Visit: Payer: Self-pay

## 2020-12-09 DIAGNOSIS — E1040 Type 1 diabetes mellitus with diabetic neuropathy, unspecified: Secondary | ICD-10-CM | POA: Insufficient documentation

## 2020-12-09 DIAGNOSIS — Z7951 Long term (current) use of inhaled steroids: Secondary | ICD-10-CM | POA: Insufficient documentation

## 2020-12-09 DIAGNOSIS — Z79899 Other long term (current) drug therapy: Secondary | ICD-10-CM | POA: Insufficient documentation

## 2020-12-09 DIAGNOSIS — R112 Nausea with vomiting, unspecified: Secondary | ICD-10-CM | POA: Insufficient documentation

## 2020-12-09 DIAGNOSIS — R1031 Right lower quadrant pain: Secondary | ICD-10-CM | POA: Insufficient documentation

## 2020-12-09 DIAGNOSIS — J45909 Unspecified asthma, uncomplicated: Secondary | ICD-10-CM | POA: Insufficient documentation

## 2020-12-09 DIAGNOSIS — B20 Human immunodeficiency virus [HIV] disease: Secondary | ICD-10-CM | POA: Insufficient documentation

## 2020-12-09 DIAGNOSIS — F17210 Nicotine dependence, cigarettes, uncomplicated: Secondary | ICD-10-CM | POA: Insufficient documentation

## 2020-12-09 DIAGNOSIS — I10 Essential (primary) hypertension: Secondary | ICD-10-CM | POA: Insufficient documentation

## 2020-12-09 LAB — URINALYSIS, ROUTINE W REFLEX MICROSCOPIC
Bacteria, UA: NONE SEEN
Bilirubin Urine: NEGATIVE
Glucose, UA: >=500 mg/dL — AB
Hgb urine dipstick: NEGATIVE
Ketones, ur: 20 mg/dL — AB
Leukocytes,Ua: NEGATIVE
Nitrite: NEGATIVE
Protein, ur: NEGATIVE mg/dL
Specific Gravity, Urine: 1.042 — ABNORMAL HIGH (ref 1.005–1.030)
pH: 6 (ref 5.0–8.0)

## 2020-12-09 LAB — COMPREHENSIVE METABOLIC PANEL
ALT: 17 U/L (ref 0–44)
AST: 22 U/L (ref 15–41)
Albumin: 3.9 g/dL (ref 3.5–5.0)
Alkaline Phosphatase: 59 U/L (ref 38–126)
Anion gap: 10 (ref 5–15)
BUN: 7 mg/dL (ref 6–20)
CO2: 22 mmol/L (ref 22–32)
Calcium: 8.7 mg/dL — ABNORMAL LOW (ref 8.9–10.3)
Chloride: 100 mmol/L (ref 98–111)
Creatinine, Ser: 0.91 mg/dL (ref 0.61–1.24)
GFR, Estimated: >60 mL/min (ref >60)
Glucose, Bld: 348 mg/dL — ABNORMAL HIGH (ref 70–99)
Potassium: 3.8 mmol/L (ref 3.5–5.1)
Sodium: 132 mmol/L — ABNORMAL LOW (ref 135–145)
Total Bilirubin: 1.1 mg/dL (ref 0.3–1.2)
Total Protein: 7.4 g/dL (ref 6.5–8.1)

## 2020-12-09 LAB — CBC WITH DIFFERENTIAL/PLATELET
Abs Immature Granulocytes: 0.02 10*3/uL (ref 0.00–0.07)
Basophils Absolute: 0 10*3/uL (ref 0.0–0.1)
Basophils Relative: 0 %
Eosinophils Absolute: 0 10*3/uL (ref 0.0–0.5)
Eosinophils Relative: 1 %
HCT: 44.4 % (ref 39.0–52.0)
Hemoglobin: 14.7 g/dL (ref 13.0–17.0)
Immature Granulocytes: 0 %
Lymphocytes Relative: 35 %
Lymphs Abs: 1.7 10*3/uL (ref 0.7–4.0)
MCH: 27.8 pg (ref 26.0–34.0)
MCHC: 33.1 g/dL (ref 30.0–36.0)
MCV: 84.1 fL (ref 80.0–100.0)
Monocytes Absolute: 0.4 10*3/uL (ref 0.1–1.0)
Monocytes Relative: 8 %
Neutro Abs: 2.8 10*3/uL (ref 1.7–7.7)
Neutrophils Relative %: 56 %
Platelets: 205 10*3/uL (ref 150–400)
RBC: 5.28 MIL/uL (ref 4.22–5.81)
RDW: 13.5 % (ref 11.5–15.5)
WBC: 5 10*3/uL (ref 4.0–10.5)
nRBC: 0 % (ref 0.0–0.2)

## 2020-12-09 LAB — CBG MONITORING, ED
Glucose-Capillary: 315 mg/dL — ABNORMAL HIGH (ref 70–99)
Glucose-Capillary: 271 mg/dL — ABNORMAL HIGH (ref 70–99)

## 2020-12-09 LAB — BETA-HYDROXYBUTYRIC ACID: Beta-Hydroxybutyric Acid: 1.18 mmol/L — ABNORMAL HIGH (ref 0.05–0.27)

## 2020-12-09 LAB — LIPASE, BLOOD: Lipase: 21 U/L (ref 11–51)

## 2020-12-09 MED ORDER — DIPHENHYDRAMINE HCL 25 MG PO CAPS
25.0000 mg | ORAL_CAPSULE | Freq: Once | ORAL | Status: AC
  Administered 2020-12-09: 25 mg via ORAL
  Filled 2020-12-09: qty 1

## 2020-12-09 MED ORDER — MORPHINE SULFATE (PF) 2 MG/ML IV SOLN
2.0000 mg | Freq: Once | INTRAVENOUS | Status: AC
  Administered 2020-12-09: 2 mg via INTRAVENOUS
  Filled 2020-12-09: qty 1

## 2020-12-09 MED ORDER — DICYCLOMINE HCL 20 MG PO TABS: 20.0000 mg | ORAL_TABLET | Freq: Two times a day (BID) | ORAL | 0 refills | Status: DC

## 2020-12-09 MED ORDER — METOCLOPRAMIDE HCL 10 MG PO TABS: 10.0000 mg | ORAL_TABLET | Freq: Four times a day (QID) | ORAL | 0 refills | Status: DC

## 2020-12-09 MED ORDER — METOCLOPRAMIDE HCL 10 MG PO TABS
10.0000 mg | ORAL_TABLET | Freq: Four times a day (QID) | ORAL | 0 refills | Status: DC
Start: 2020-12-09 — End: 2020-12-09

## 2020-12-09 MED ORDER — LACTATED RINGERS IV BOLUS
1000.0000 mL | Freq: Once | INTRAVENOUS | Status: AC
  Administered 2020-12-09: 1000 mL via INTRAVENOUS

## 2020-12-09 MED ORDER — ONDANSETRON 4 MG PO TBDP: 4.0000 mg | ORAL_TABLET | Freq: Three times a day (TID) | ORAL | 0 refills | Status: DC | PRN

## 2020-12-09 MED ORDER — ONDANSETRON HCL 4 MG/2ML IJ SOLN
4.0000 mg | Freq: Once | INTRAMUSCULAR | Status: AC
  Administered 2020-12-09: 4 mg via INTRAVENOUS
  Filled 2020-12-09: qty 2

## 2020-12-09 MED ORDER — IOHEXOL 350 MG/ML SOLN
80.0000 mL | Freq: Once | INTRAVENOUS | Status: AC | PRN
  Administered 2020-12-09: 80 mL via INTRAVENOUS

## 2020-12-09 MED ORDER — METOCLOPRAMIDE HCL 5 MG/ML IJ SOLN
10.0000 mg | Freq: Once | INTRAMUSCULAR | Status: AC
  Administered 2020-12-09: 10 mg via INTRAVENOUS
  Filled 2020-12-09: qty 2

## 2020-12-09 NOTE — Discharge Instructions (Addendum)
Your work-up today in the ER was overall reassuring.  He did have quite elevated blood sugar and you will need to follow-up with a primary care provider to ensure that you are started on long-acting insulin.  Our social worker has already scheduled appointment with you at [facility]  Please keep this appointment and did not miss it.  Please drink plenty of water.  Use Reglan as needed for nausea.  As prescribed I recommend taking 25 mg of Benadryl each time you take the Reglan.  Please take your other medications as prescribed.  I also prescribed you 4 tablets of Zofran to use underneath your tongue if you are actively vomiting.  I recommend taking nausea medication at regular intervals to prevent vomiting.  You may always return to the ER for new or concerning symptoms.   Please follow-up within the week with your infectious disease provider to discuss your symptoms and your ongoing care.  As we discussed your CT scan does have some mild abnormalities in the rectal area however given that you are not at risk for STD in this area I think it is very reasonable for [place] to hold off on antibiotics however I would have a low threshold for treating with antibiotics should you develop fevers, rectal discharge or any other new or concerning symptoms.  I have also given you reglan as a pain medicine.    Additional Mental Health resource   [facility]  [address], [place], [place] [address] [phone]

## 2020-12-09 NOTE — ED Provider Notes (Signed)
[facility]-EMERGENCY DEPT Provider Note   CSN: [id number] Arrival date & time: 12/09/20  1000     History No chief complaint on file.   Scott Baxter is a 25 y.o. male.  HPI Patient is 25 year old diabetic male presented today with right lower quadrant abdominal pain for now 4 days.  He states he has had increased nausea and vomiting over the last 2 days.  Denies any urinary symptoms apart from frequent urination which she attributes to his high blood sugar.  States he is unsure exactly able to take anything other than his NovoLog.  Which is short acting he does not have a long-acting insulin.  States he does not have a primary care provider.  Also has a history of HIV.  Patient has had progressive right lower quadrant abdominal pain for the past 4 days.  He denies any aggravating mitigating factors.  He is a difficult social situation given that he is living with his father.  Does not have insurance or a primary care provider.  He states that we will prompted him to come to the ER today was still continued right lower quadrant abdominal pain and nausea and vomiting which started 12 hours ago has been persistent since.  He feels somewhat fatigued as well.  Denies any changes with his bowel movements denies any rectal bleeding or rectal discharge no mucousy discharge from anus.  No anal receptive sex no penile or testicular pain or swelling no dysuria frequency urgency or hematuria.     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   Healthcare maintenance 05/14/2019   Screening for STDs (sexually transmitted diseases) 11/14/2018   Depression 02/24/2017   Eczema 02/24/2017   Chlamydia infection 10/29/2016   Herpes labialis 11/26/2015   HIV disease (HCC) 09/03/2014   Gastroparesis 08/30/2014   Diabetic neuropathy (HCC) 08/30/2014   Asthma 08/30/2014   Tobacco abuse 08/17/2014   Type 1 diabetes, uncontrolled, 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/01/2010   Goiter, unspecified 09/01/2010   Hypertension 09/01/2010    Past Surgical History:  Procedure Laterality Date   EXCISIONAL HEMORRHOIDECTOMY     FOOT SURGERY  1999   ingrown toenail removal       Family History  Problem Relation Age of Onset   Drug abuse Mother    Heart disease Mother    Alcohol abuse Father    Diabetes Paternal Aunt    Arthritis Paternal Grandmother    Stroke Paternal Grandfather    Diabetes Paternal Grandfather     Social History   Tobacco Use   Smoking status: Every Day    Packs/day: 0.50    Types: Cigarettes   Smokeless tobacco: Never  Vaping Use   Vaping Use: Never used  Substance Use Topics   Alcohol use: Yes    Alcohol/week: 0.0 standard drinks    Comment: socially   Drug use: Yes    Frequency: 3.0 times per week  Types: Marijuana    Home Medications Prior to Admission medications   Medication Sig Start Date End Date Taking? Authorizing Provider  dicyclomine (BENTYL) 20 MG tablet Take 1 tablet (20 mg total) by mouth 2 (two) times daily. 12/09/20  Yes Edon Hoadley, Stevphen Meuse S, PA  metoCLOPramide (REGLAN) 10 MG tablet Take 1 tablet (10 mg total) by mouth every 6 (six) hours. 12/09/20  Yes Lorenia Hoston S, PA  ondansetron (ZOFRAN ODT) 4 MG disintegrating tablet Take 1 tablet (4 mg total) by mouth every 8 (eight) hours as needed for nausea or vomiting. 12/09/20  Yes Solon Augusta S, PA  acetaminophen (TYLENOL) 500 MG tablet Take 1 tablet (500 mg total) by mouth every 6 (six) hours as needed.  02/05/20   Fayrene Helper, PA-C  albuterol (PROVENTIL HFA;VENTOLIN HFA) 108 (90 Base) MCG/ACT inhaler Inhale 2 puffs into the lungs every 4 (four) hours as needed for wheezing or shortness of breath. 05/11/15   Pisciotta, Joni Reining, PA-C  amoxicillin-clavulanate (AUGMENTIN) 875-125 MG tablet Take 1 tablet by mouth 2 (two) times daily. 04/15/20   Veryl Speak, FNP  bictegravir-emtricitabine-tenofovir AF (BIKTARVY) 50-200-25 MG TABS tablet Take 1 tablet by mouth daily. 04/09/20   Veryl Speak, FNP  doxycycline (VIBRA-TABS) 100 MG tablet Take 1 tablet (100 mg total) by mouth 2 (two) times daily. 04/15/20   Veryl Speak, FNP  fluticasone (FLONASE) 50 MCG/ACT nasal spray Place 1 spray into both nostrils daily. 10/10/19   Merrilee Jansky, MD  glucose blood test strip USE AS INSTRUCTED 04/10/20 04/10/21  Yvette Rack, MD  insulin aspart (NOVOLOG FLEXPEN) 100 UNIT/ML FlexPen Inject 15 Units into the skin 3 (three) times daily. 10/20/20   Evlyn Kanner, MD  Insulin Pen Needle (PEN NEEDLES) 31G X 5 MM MISC 1 Units by Does not apply route 4 (four) times daily -  before meals and at bedtime. 04/10/20   Agyei, Hermina Staggers, MD  Insulin Pen Needle 31G X 5 MM MISC USE AS DIRECTED 4 (FOUR) TIMES DAILY - BEFORE MEALS AND AT BEDTIME. 04/10/20 04/10/21  Yvette Rack, MD  Lancets MISC 1 Units by Does not apply route 4 (four) times daily -  before meals and at bedtime. 04/10/20   Agyei, Hermina Staggers, MD  LEVEMIR FLEXTOUCH 100 UNIT/ML FlexPen INJECT 20 UNITS INTO THE SKIN AT BEDTIME 04/10/20 04/10/21  Agyei, Hermina Staggers, MD  lisinopril (ZESTRIL) 20 MG tablet TAKE 2 TABLETS (40 MG TOTAL) BY MOUTH DAILY. 04/10/20 04/10/21  Yvette Rack, MD  loratadine (CLARITIN) 10 MG tablet Take 1 tablet (10 mg total) by mouth daily. 10/10/19   LampteyBritta Mccreedy, MD  Microlet Lancets MISC USE AS DIRECTED 4 TIMES DAILY - BEFORE MEALS AND AT BEDTIME 04/10/20 04/10/21  Agyei, Hermina Staggers, MD  sodium fluoride (PREVIDENT 5000 PLUS) 1.1 % CREA dental cream BRUSH ON TEETH  WITH A TOOTHBRUSH AFTER EVENING MOUTH CARE. SPIT OUT EXCESS AND DO NOT RINSE. 06/02/20 06/02/21    Insulin Glargine (LANTUS SOLOSTAR) 100 UNIT/ML Solostar Pen Inject 20 Units into the skin daily at 10 pm. 05/16/19 10/10/19  Levora Dredge, MD    Allergies    Patient has no known allergies.  Review of Systems   Review of Systems  Constitutional:  Positive for fatigue. Negative for chills and fever.  HENT:  Negative for congestion.   Eyes:  Negative for pain.  Respiratory:  Negative for cough and shortness of breath.   Cardiovascular:  Negative for chest pain and leg swelling.  Gastrointestinal:  Positive for abdominal pain, nausea and vomiting. Negative for blood in stool and diarrhea.  Genitourinary:  Negative for dysuria.  Musculoskeletal:  Negative for myalgias.  Skin:  Negative for rash.  Neurological:  Negative for dizziness and headaches.   Physical Exam Updated Vital Signs BP (!) 156/84 (BP Location: Left Arm)   Pulse (!) 53   Temp 98 F (36.7 C) (Oral)   Resp 16   Ht  (1.753 m)   Wt 99.8 kg   SpO2 100%   BMI 32.49 kg/m   Physical Exam Vitals and nursing note reviewed.  Constitutional:      General: He is not in acute distress.    Appearance: He is obese.     Comments: Pleasant 25 year old male in no acute distress but uncomfortable appearing.  His vomiting.  HENT:     Head: Normocephalic and atraumatic.     Nose: Nose normal.     Mouth/Throat:     Mouth: Mucous membranes are moist.  Eyes:     General: No scleral icterus. Cardiovascular:     Rate and Rhythm: Normal rate and regular rhythm.     Pulses: Normal pulses.     Heart sounds: Normal heart sounds.  Pulmonary:     Effort: Pulmonary effort is normal. No respiratory distress.     Breath sounds: No wheezing.  Abdominal:     Palpations: Abdomen is soft.     Tenderness: There is abdominal tenderness.     Comments: Some right lower quadrant tenderness to palpation.  No suprapubic or left lower quadrant  tenderness palpation.  No guarding or rebound.  Abdomen is obese.  Protuberant.  Genitourinary:    Penis: Normal.      Testes: Normal.     Comments: No penile discharge. No testicular tenderness to palpation. Musculoskeletal:     Cervical back: Normal range of motion.     Right lower leg: No edema.     Left lower leg: No edema.  Skin:    General: Skin is warm and dry.     Capillary Refill: Capillary refill takes less than 2 seconds.  Neurological:     Mental Status: He is alert. Mental status is at baseline.  Psychiatric:        Mood and Affect: Mood normal.        Behavior: Behavior normal.    ED Results / Procedures / Treatments   Labs (all labs ordered are listed, but only abnormal results are displayed) Labs Reviewed  BETA-HYDROXYBUTYRIC ACID - Abnormal; Notable for the following components:      Result Value   Beta-Hydroxybutyric Acid 1.18 (*)    All other components within normal limits  URINALYSIS, ROUTINE W REFLEX MICROSCOPIC - Abnormal; Notable for the following components:   Specific Gravity, Urine 1.042 (*)    Glucose, UA >=500 (*)    Ketones, ur 20 (*)    All other components within normal limits  COMPREHENSIVE METABOLIC PANEL - Abnormal; Notable for the following components:   Sodium 132 (*)    Glucose, Bld 348 (*)    Calcium 8.7 (*)    All other components within normal limits  CBG MONITORING, ED - Abnormal; Notable for the following components:   Glucose-Capillary 315 (*)    All other components within normal limits  CBG MONITORING, ED - Abnormal; Notable for the following components:   Glucose-Capillary 271 (*)    All other components within normal limits  CBC WITH DIFFERENTIAL/PLATELET  LIPASE,  BLOOD    EKG None  Radiology CT ABDOMEN PELVIS W CONTRAST  Result Date: 12/09/2020 CLINICAL DATA:  Right lower quadrant abdominal pain for 4 days. Nausea and vomiting starting 12 hours prior to imaging. EXAM: CT ABDOMEN AND PELVIS WITH CONTRAST TECHNIQUE:  Multidetector CT imaging of the abdomen and pelvis was performed using the standard protocol following bolus administration of intravenous contrast. CONTRAST:  64mL OMNIPAQUE IOHEXOL 350 MG/ML SOLN COMPARISON:  08/17/2014 FINDINGS: Lower chest: Linear subsegmental atelectasis in the posterior basal segment right lower lobe. Hepatobiliary: Unremarkable Pancreas: Unremarkable Spleen: Unremarkable Adrenals/Urinary Tract: Unremarkable Stomach/Bowel: Normal appendix. No dilated bowel. There is only a small amount of oral contrast in the bowel, mostly in the proximal colon. Although less striking than on the prior exam, there are potential low-grade inflammatory findings in the rectum with minimal wall thickening and adjacent small reactive lymph nodes as well as potential mild perirectal hyperemia, raising the possibility of mild rectal inflammation. No compelling findings of perianal abscess. Vascular/Lymphatic: Unremarkable Reproductive: Unremarkable Other: No supplemental non-categorized findings. Musculoskeletal: Unremarkable IMPRESSION: 1. Normal appendix. The cecum and terminal ileum appear unremarkable. 2. Potential mild rectal wall thickening with accentuated perirectal lymph nodes and vascularity raising the possibility of low-grade proctitis. 3. Linear subsegmental atelectasis in the posterior basal segment right lower lobe. Electronically Signed   By: Gaylyn Rong M.D.   On: 12/09/2020 12:15    Procedures Procedures   Medications Ordered in ED Medications  morphine 2 MG/ML injection 2 mg (2 mg Intravenous Given 12/09/20 1101)  ondansetron (ZOFRAN) injection 4 mg (4 mg Intravenous Given 12/09/20 1102)  iohexol (OMNIPAQUE) 350 MG/ML injection 80 mL (80 mLs Intravenous Contrast Given 12/09/20 1141)  lactated ringers bolus 1,000 mL (0 mLs Intravenous Stopped 12/09/20 1439)  metoCLOPramide (REGLAN) injection 10 mg (10 mg Intravenous Given 12/09/20 1237)  diphenhydrAMINE (BENADRYL) capsule 25 mg (25 mg Oral  Given 12/09/20 1239)    ED Course  I have reviewed the triage vital signs and the nursing notes.  Pertinent labs & imaging results that were available during my care of the patient were reviewed by me and considered in my medical decision making (see chart for details).  Clinical Course as of 12/10/20 5701  Tue Dec 09, 2020  1050 Patient is 25 year old diabetic male presented today with right lower quadrant abdominal pain for now 4 days.  He states he has had increased nausea and vomiting over the last 2 days.  Denies any urinary symptoms apart from frequent urination which she attributes to his high blood sugar.  States he is unsure exactly able to take anything other than his NovoLog.  Which is short acting he does not have a long-acting insulin.  States he does not have a primary care provider.  Also has a history of HIV. [WF]    Clinical Course User Index [WF] Gailen Shel[place], [place]   MDM Rules/Calculators/A&P                           Patient is a 25 year old male presenting to the emergency department today for symptoms described above.  Specifically right lower quadrant abdominal pain for 4 days and nausea vomiting over the past 24 hours.  He has a history of type 1 diabetes controls his blood sugars with only short acting insulin states that he does not have a primary care provider or the economic means to pay for long-acting insulin.  Throughout her work-up social work/case management was  very helpful in evaluating patient and appreciate their expert assistance.  Physical exam is notable for right lower quadrant abdominal tenderness.  Patient does have an appendix still.  I do have some concern that he may have appendicitis.  Will obtain CT imaging.  CBC notably without leukocytosis WBC 5.  This is also surprising because patient has been vomiting over the past 24 hours frequently.  He was retching on my initial evaluation.  Initial CBG is 315.  Will initiate Zofran, 1 L LR, and 2 mg of  morphine.  On my reassessment patient is completely pain-free and states that his nausea is much improved although he still feels somewhat nauseous he is requesting medication for headache that he has now developed.  This may be secondary to Zofran use.  Will provide with Reglan and Benadryl.  CMP result with mild hyponatremia corrects to normal given hyperglycemia.  No anion gap bicarb within normal limits DKA very unlikely.  Beta hydroxybutyrate 1.18.  This is relatively low.  Lipase of normal.  Pancreatitis.  Glucosuria with ketones present and high specific gravity consistent with dehydration this is overall consistent with my exam which is concerning for dehydration which is being remedied with IV fluids.  CT abdomen pelvis shows no evidence of appendicitis there is a completely normal appendix.  Question whether there is mild rectal wall thickening or not.  Patient is having no mucus continue rectal discharge no rectal bleeding no rectal pain.  Denies any anal receptive sex.  Denies any concern for STDs.  He follows with infectious disease states that he is fastidious about his care.  He states that his viral load is undetectable at his last visit and CD4 count was normal.  Had lengthy shared decision-making conversation with him and grandmother who is now at bedside.  They prefer to hold off on antibiotics at this time I think this is reasonable.  I relayed my concern for infection however I think it is reasonable to allow patient to follow-up with infectious disease and his PCP who social work has scheduled appointment with this month.  Return to the ER for any new or concerning symptoms.  Discharged home with 4 tablets of Zofran to use should he be actively vomiting but primarily instructed to use Reglan given his history of gastroparesis.  Bentyl for pain.  Tylenol as well.  Patient tolerating p.o. and ambulatory at time of discharge.  Final Clinical Impression(s) / ED Diagnoses Final diagnoses:   Non-intractable vomiting with nausea, unspecified vomiting type  Right lower quadrant abdominal pain    Rx / DC Orders ED Discharge Orders          Ordered    metoCLOPramide (REGLAN) 10 MG tablet  Every 6 hours        12/09/20 1521    ondansetron (ZOFRAN ODT) 4 MG disintegrating tablet  Every 8 hours PRN        12/09/20 1521    dicyclomine (BENTYL) 20 MG tablet  2 times daily        12/09/20 1521             Solon Augusta [place], [place] 12/10/20 0932    Linwood Dibbles, MD 12/10/20 1549

## 2020-12-09 NOTE — Progress Notes (Signed)
Match letter and instructions given to patient.  Verbal understanding returned.

## 2020-12-09 NOTE — ED Triage Notes (Signed)
Per EMS , abdominal pain x 4 days, n/v started 12 hours ago. RLA hurting 164/90 CBG 405 TYPE 1 normally runs 300, HX, HIV ,Genella Rife

## 2020-12-09 NOTE — Progress Notes (Signed)
..  Transition of Care [facility]) - Emergency Department Mini Assessment   Patient Details  Name: Scott Baxter MRN: [id number] Date of Birth: 05-May-1996  Transition of Care [facility]) CM/SW Contact:    Larrie Kass, LCSW Phone Number: 12/09/2020, 2:11 PM   Clinical Narrative:  CSW spoke with pt at bed side, pt stated having issues with paying for his medications. CSW requested assistance from [facility] to provided March program information to assist pt with paying for medication. Pt stated he is not working right now due to issues with his car. Pt reported living with his family, he stated living environment is not good for him and wanted to seek Mental Health Services. CSW informed pt about his follow up appointment with [facility] on the 17th. CSW attached OP mental health and shelter resources. Pt requested transportation to home upon d/c.    Valentina Shaggy.Daena Alper, MSW, LCSWA [facility]  Transitions of Care Clinical Social Worker I Direct Dial: [phone]  Fax: [phone] [email or web address]   ED Mini Assessment:    Barriers to Discharge: No Barriers Identified, Continued Medical Work up        Interventions which prevented an admission or readmission: Transportation Screening, Follow-up medical appointment, Homeless Screening    Patient Contact and Communications        ,                 Admission diagnosis:  abdominal pain;emesis Patient Active Problem List   Diagnosis Date Noted   Abscess of pubic region 04/15/2020   Subungual hematoma of great toe 04/10/2020   Positive RPR test 04/10/2020   Healthcare maintenance 05/14/2019   Screening for STDs (sexually transmitted diseases) 11/14/2018   Depression 02/24/2017   Eczema 02/24/2017   Chlamydia infection 10/29/2016   Herpes labialis 11/26/2015   HIV disease (HCC) 09/03/2014   Gastroparesis 08/30/2014   Diabetic neuropathy (HCC) 08/30/2014    Asthma 08/30/2014   Tobacco abuse 08/17/2014   Type 1 diabetes, uncontrolled, 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 [place] [address] Phone: [phone] Fax: [phone]

## 2020-12-09 NOTE — Progress Notes (Signed)
CSW confirmed safe transport ride to home address.

## 2020-12-09 NOTE — ED Notes (Signed)
Grandmother or mother would like call with update.

## 2020-12-10 ENCOUNTER — Other Ambulatory Visit: Payer: Self-pay | Admitting: Student

## 2020-12-10 ENCOUNTER — Other Ambulatory Visit (HOSPITAL_COMMUNITY): Payer: Self-pay

## 2020-12-10 DIAGNOSIS — IMO0002 Reserved for concepts with insufficient information to code with codable children: Secondary | ICD-10-CM

## 2020-12-10 DIAGNOSIS — E1043 Type 1 diabetes mellitus with diabetic autonomic (poly)neuropathy: Secondary | ICD-10-CM

## 2020-12-10 MED FILL — Insulin Detemir Soln Pen-injector 100 Unit/ML: SUBCUTANEOUS | 30 days supply | Qty: 6 | Fill #2 | Status: AC

## 2020-12-24 ENCOUNTER — Ambulatory Visit (INDEPENDENT_AMBULATORY_CARE_PROVIDER_SITE_OTHER): Payer: Self-pay | Admitting: Family Medicine

## 2020-12-24 ENCOUNTER — Encounter: Payer: Self-pay | Admitting: Family Medicine

## 2020-12-24 ENCOUNTER — Other Ambulatory Visit: Payer: Self-pay

## 2020-12-24 VITALS — BP 125/85 | HR 77 | Temp 97.7°F | Resp 16 | Ht 69.0 in | Wt 227.0 lb

## 2020-12-24 DIAGNOSIS — F321 Major depressive disorder, single episode, moderate: Secondary | ICD-10-CM

## 2020-12-24 DIAGNOSIS — E1043 Type 1 diabetes mellitus with diabetic autonomic (poly)neuropathy: Secondary | ICD-10-CM

## 2020-12-24 DIAGNOSIS — E1065 Type 1 diabetes mellitus with hyperglycemia: Secondary | ICD-10-CM

## 2020-12-24 DIAGNOSIS — Z7689 Persons encountering health services in other specified circumstances: Secondary | ICD-10-CM

## 2020-12-24 DIAGNOSIS — IMO0002 Reserved for concepts with insufficient information to code with codable children: Secondary | ICD-10-CM

## 2020-12-24 DIAGNOSIS — K3184 Gastroparesis: Secondary | ICD-10-CM

## 2020-12-24 DIAGNOSIS — I10 Essential (primary) hypertension: Secondary | ICD-10-CM

## 2020-12-24 LAB — POCT GLYCOSYLATED HEMOGLOBIN (HGB A1C): HbA1c, POC (controlled diabetic range): 11.5 % — AB (ref 0.0–7.0)

## 2020-12-24 LAB — POCT CBG (FASTING - GLUCOSE)-MANUAL ENTRY: Glucose Fasting, POC: 299 mg/dL — AB (ref 70–99)

## 2020-12-24 NOTE — Progress Notes (Signed)
HFU- medication Abdominal pain

## 2020-12-25 ENCOUNTER — Other Ambulatory Visit: Payer: Self-pay

## 2020-12-25 MED ORDER — PEN NEEDLES 31G X 5 MM MISC: 1.0000 [IU] | Freq: Three times a day (TID) | 11 refills | Status: DC

## 2020-12-25 MED ORDER — ESCITALOPRAM OXALATE 10 MG PO TABS: 10.0000 mg | ORAL_TABLET | Freq: Every day | ORAL | 0 refills | Status: DC

## 2020-12-25 MED ORDER — LISINOPRIL 20 MG PO TABS: ORAL_TABLET | Freq: Every day | ORAL | 2 refills | Status: DC

## 2020-12-25 MED ORDER — NOVOLOG FLEXPEN 100 UNIT/ML ~~LOC~~ SOPN: 15.0000 [IU] | PEN_INJECTOR | Freq: Three times a day (TID) | SUBCUTANEOUS | 3 refills | Status: DC

## 2020-12-25 MED ORDER — LEVEMIR FLEXTOUCH 100 UNIT/ML ~~LOC~~ SOPN
PEN_INJECTOR | SUBCUTANEOUS | 3 refills | Status: DC
  Filled 2020-12-25: qty 15, fill #0
  Filled 2021-01-05: qty 3, 15d supply, fill #0

## 2020-12-25 MED ORDER — ONETOUCH ULTRASOFT LANCETS MISC: 12 refills | Status: AC

## 2020-12-25 MED ORDER — INSULIN PEN NEEDLE 31G X 5 MM MISC: 11 refills | Status: AC

## 2020-12-25 NOTE — Progress Notes (Signed)
New Patient Office Visit  Subjective:  Patient ID: Scott Baxter, male    DOB: April 16, 1996  Age: 25 y.o. MRN: [id number]  CC:  Chief Complaint  Patient presents with   Medication Refill   Hospitalization Follow-up    HPI Scott Baxter presents for to establish care.  Patient has multiple major medical issues including diabetes with gastroparesis and HIV.  He recently was seen in the ED for abdominal pain.  He does have a GI physician that manages his gastroparesis. patient primarily desires refills of his medications for diabetes.  Patient reports that he has had difficulty getting refills of his medicines for diabetes in the past.  He also reports increased social stressors.  Past Medical History:  Diagnosis Date   Asthma    Depression 02/24/2017   Diabetes mellitus 2010   HCAP (healthcare-associated pneumonia) 08/31/2014   HIV disease (HCC) 09/03/2014   Intrauterine drug exposure    IUGR (intrauterine growth restriction)    Obesity    Proctitis    Proctitis    Recurrent boils    MRSA and MSSA   Vision abnormalities     Past Surgical History:  Procedure Laterality Date   EXCISIONAL HEMORRHOIDECTOMY     FOOT SURGERY  1999   ingrown toenail removal    Family History  Problem Relation Age of Onset   Drug abuse Mother    Heart disease Mother    Alcohol abuse Father    Diabetes Paternal Aunt    Arthritis Paternal Grandmother    Stroke Paternal Grandfather    Diabetes Paternal Grandfather     Social History   Socioeconomic History   Marital status: Single    Spouse name: Not on file   Number of children: Not on file   Years of education: Not on file   Highest education level: Not on file  Occupational History   Not on file  Tobacco Use   Smoking status: Every Day    Packs/day: 0.50    Types: Cigarettes   Smokeless tobacco: Never  Vaping Use   Vaping Use: Never used  Substance and Sexual Activity   Alcohol use: Yes    Alcohol/week: 0.0 standard  drinks    Comment: socially   Drug use: Yes    Frequency: 3.0 times per week    Types: Marijuana   Sexual activity: Not Currently    Comment: given condoms  Other Topics Concern   Not on file  Social History Narrative   Grandmother has custody. 12th grade at [employer]. Accepted to [employer] for the fall. Wants to major in Nursing.    Social Determinants of Health   Financial Resource Strain: High Risk   Difficulty of Paying Living Expenses: Very hard  Food Insecurity: Geophysicist/field seismologist Present   Worried About Programme researcher, broadcasting/film/video in the Last Year: Sometimes true   Barista in the Last Year: Sometimes true  Transportation Needs: Personal assistant (Medical): No   Lack of Transportation (Non-Medical): Yes  Physical Activity: Not on file  Stress: Stress Concern Present   Feeling of Stress : Very much  Social Connections: Not on file  Intimate Partner Violence: Not At Risk   Fear of Current or Ex-Partner: No   Emotionally Abused: No   Physically Abused: No   Sexually Abused: No    ROS Review of Systems  All other systems reviewed and are negative.  Objective:   Today's  Vitals: BP 125/85 (BP Location: Right Arm, Patient Position: Sitting, Cuff Size: Large)   Pulse 77   Temp 97.7 F (36.5 C)   Resp 16   Ht 5\' 9"  (1.753 m)   Wt 227 lb (103 kg)   SpO2 97%   BMI 33.52 kg/m   Physical Exam Vitals and nursing note reviewed.  Constitutional:      General: He is not in acute distress. Cardiovascular:     Rate and Rhythm: Normal rate and regular rhythm.  Pulmonary:     Effort: Pulmonary effort is normal.     Breath sounds: Normal breath sounds.  Abdominal:     Palpations: Abdomen is soft.     Tenderness: There is no abdominal tenderness.  Neurological:     General: No focal deficit present.     Mental Status: He is alert and oriented to person, place, and time.  Psychiatric:        Mood and Affect: Mood normal.         Behavior: Behavior normal.    Assessment & Plan:   1. Type 1 diabetes, uncontrolled, with gastroparesis (HCC) Meds refilled.  Discussed compliance with the patient.  Will monitor. - HgB A1c - Glucose (CBG), Fasting - Insulin Pen Needle (PEN NEEDLES) 31G X 5 MM MISC; 1 Units by Does not apply route 4 (four) times daily -  before meals and at bedtime.  Dispense: 100 each; Refill: 11  2. Current moderate episode of major depressive disorder, unspecified whether recurrent (HCC) Lexapro 10 mg daily prescribed.  Will monitor.  Patient defers counseling at this time.  3. Essential hypertension Appears stable.  Meds refilled.  Continue present management. - lisinopril (ZESTRIL) 20 MG tablet; TAKE 2 TABLETS (40 MG TOTAL) BY MOUTH DAILY.  Dispense: 60 tablet; Refill: 2  4. Encounter to establish care     Outpatient Encounter Medications as of 12/24/2020  Medication Sig   albuterol (PROVENTIL HFA;VENTOLIN HFA) 108 (90 Base) MCG/ACT inhaler Inhale 2 puffs into the lungs every 4 (four) hours as needed for wheezing or shortness of breath.   bictegravir-emtricitabine-tenofovir AF (BIKTARVY) 50-200-25 MG TABS tablet Take 1 tablet by mouth daily.   dicyclomine (BENTYL) 20 MG tablet Take 1 tablet (20 mg total) by mouth 2 (two) times daily.   doxycycline (VIBRA-TABS) 100 MG tablet Take 1 tablet (100 mg total) by mouth 2 (two) times daily.   escitalopram (LEXAPRO) 10 MG tablet Take 1 tablet (10 mg total) by mouth daily.   fluticasone (FLONASE) 50 MCG/ACT nasal spray Place 1 spray into both nostrils daily.   glucose blood test strip USE AS INSTRUCTED   Lancets (ONETOUCH ULTRASOFT) lancets Use as instructed   Lancets MISC 1 Units by Does not apply route 4 (four) times daily -  before meals and at bedtime.   metoCLOPramide (REGLAN) 10 MG tablet Take 1 tablet (10 mg total) by mouth every 6 (six) hours.   Microlet Lancets MISC USE AS DIRECTED 4 TIMES DAILY - BEFORE MEALS AND AT BEDTIME   ondansetron  (ZOFRAN ODT) 4 MG disintegrating tablet Take 1 tablet (4 mg total) by mouth every 8 (eight) hours as needed for nausea or vomiting.   sodium fluoride (PREVIDENT 5000 PLUS) 1.1 % CREA dental cream BRUSH ON TEETH WITH A TOOTHBRUSH AFTER EVENING MOUTH CARE. SPIT OUT EXCESS AND DO NOT RINSE.   [DISCONTINUED] LEVEMIR FLEXTOUCH 100 UNIT/ML FlexTouch Pen INJECT 20 UNITS INTO THE SKIN AT BEDTIME   acetaminophen (TYLENOL) 500 MG tablet Take  1 tablet (500 mg total) by mouth every 6 (six) hours as needed. (Patient not taking: Reported on 12/24/2020)   insulin aspart (NOVOLOG FLEXPEN) 100 UNIT/ML FlexPen Inject 15 Units into the skin 3 (three) times daily.   Insulin Pen Needle (PEN NEEDLES) 31G X 5 MM MISC 1 Units by Does not apply route 4 (four) times daily -  before meals and at bedtime.   Insulin Pen Needle 31G X 5 MM MISC USE AS DIRECTED 4 (FOUR) TIMES DAILY - BEFORE MEALS AND AT BEDTIME.   LEVEMIR FLEXTOUCH 100 UNIT/ML FlexTouch Pen INJECT 20 UNITS INTO THE SKIN AT BEDTIME   lisinopril (ZESTRIL) 20 MG tablet TAKE 2 TABLETS (40 MG TOTAL) BY MOUTH DAILY.   [DISCONTINUED] amoxicillin-clavulanate (AUGMENTIN) 875-125 MG tablet Take 1 tablet by mouth 2 (two) times daily. (Patient not taking: Reported on 12/24/2020)   [DISCONTINUED] insulin aspart (NOVOLOG FLEXPEN) 100 UNIT/ML FlexPen Inject 15 Units into the skin 3 (three) times daily.   [DISCONTINUED] Insulin Glargine (LANTUS SOLOSTAR) 100 UNIT/ML Solostar Pen Inject 20 Units into the skin daily at 10 pm.   [DISCONTINUED] Insulin Pen Needle (PEN NEEDLES) 31G X 5 MM MISC 1 Units by Does not apply route 4 (four) times daily -  before meals and at bedtime.   [DISCONTINUED] Insulin Pen Needle 31G X 5 MM MISC USE AS DIRECTED 4 (FOUR) TIMES DAILY - BEFORE MEALS AND AT BEDTIME.   [DISCONTINUED] lisinopril (ZESTRIL) 20 MG tablet TAKE 2 TABLETS (40 MG TOTAL) BY MOUTH DAILY.   [DISCONTINUED] loratadine (CLARITIN) 10 MG tablet Take 1 tablet (10 mg total) by mouth daily.  (Patient not taking: Reported on 12/24/2020)   No facility-administered encounter medications on file as of 12/24/2020.    Follow-up: Return in about 4 weeks (around 01/21/2021) for follow up.   Tommie Raymond, MD

## 2020-12-31 ENCOUNTER — Other Ambulatory Visit: Payer: Self-pay

## 2021-01-01 ENCOUNTER — Other Ambulatory Visit: Payer: Medicaid Other

## 2021-01-01 ENCOUNTER — Other Ambulatory Visit: Payer: Self-pay

## 2021-01-01 DIAGNOSIS — B20 Human immunodeficiency virus [HIV] disease: Secondary | ICD-10-CM

## 2021-01-01 DIAGNOSIS — Z113 Encounter for screening for infections with a predominantly sexual mode of transmission: Secondary | ICD-10-CM

## 2021-01-01 DIAGNOSIS — Z79899 Other long term (current) drug therapy: Secondary | ICD-10-CM

## 2021-01-05 ENCOUNTER — Other Ambulatory Visit: Payer: Self-pay

## 2021-01-15 ENCOUNTER — Ambulatory Visit (INDEPENDENT_AMBULATORY_CARE_PROVIDER_SITE_OTHER): Payer: Self-pay | Admitting: Family

## 2021-01-15 ENCOUNTER — Ambulatory Visit: Payer: Self-pay

## 2021-01-15 ENCOUNTER — Other Ambulatory Visit: Payer: Self-pay

## 2021-01-15 ENCOUNTER — Encounter: Payer: Self-pay | Admitting: Family

## 2021-01-15 VITALS — BP 137/90 | HR 76 | Temp 97.8°F | Wt 228.0 lb

## 2021-01-15 DIAGNOSIS — B20 Human immunodeficiency virus [HIV] disease: Secondary | ICD-10-CM

## 2021-01-15 DIAGNOSIS — Z79899 Other long term (current) drug therapy: Secondary | ICD-10-CM

## 2021-01-15 DIAGNOSIS — Z23 Encounter for immunization: Secondary | ICD-10-CM

## 2021-01-15 DIAGNOSIS — R9389 Abnormal findings on diagnostic imaging of other specified body structures: Secondary | ICD-10-CM

## 2021-01-15 DIAGNOSIS — Z113 Encounter for screening for infections with a predominantly sexual mode of transmission: Secondary | ICD-10-CM

## 2021-01-15 DIAGNOSIS — Z0000 Encounter for general adult medical examination without abnormal findings: Secondary | ICD-10-CM

## 2021-01-15 MED ORDER — BIKTARVY 50-200-25 MG PO TABS: 1.0000 | ORAL_TABLET | Freq: Every day | ORAL | 5 refills | Status: DC

## 2021-01-15 NOTE — Assessment & Plan Note (Signed)
   Discussed importance of safe sexual practices and condom usage.  Condoms offered and provided.  1st dose of monkeypox vaccine provided.

## 2021-01-15 NOTE — Progress Notes (Signed)
Brief Narrative   Patient ID: FENRIS CAUBLE, male    DOB: 1995/10/06, 25 y.o.   MRN: [id number]  Mr. Proby is a 25 y/o AA male diagnosed with HIV disease in April 2016 with risk factor of MSM. Initial CD4 nadir of 270 with viral load of 4.2 million.and suspected acute infection. Genotype with no significant resistance patterns. Entered care in CDC  Stage 2. [id number] negative. No history of opportunistic infection. Previous ART history with [facility] and now [employer].   Subjective:    Chief Complaint  Patient presents with   Follow-up    B20    HPI:  KAMRON VANWYHE is a 25 y.o. male with HIV disease was last seen on 04/09/21 with well controlled virus and good adherence and tolerance to his ART regimen of Biktarvy. Viral load was undetectable and CD4 count was 760. Most recent lab work completed on 06/25/20 with CD4 count of 812 and viral load that remained undetectable. Here today for follow up.   Mr. Gibbard continues to take his Susanne Borders daily as prescribed having missed a couple doses recently.  Overall feeling well today with no new concerns/complaints.  He is having issues with depression is currently being seen by her primary care/internal medicine has been started on medication. Denies fevers, chills, night sweats, headaches, changes in vision, neck pain/stiffness, nausea, diarrhea, vomiting, lesions or rashes.  Mr. Daphine Deutscher has no problems obtaining medication from the pharmacy and is covered through [facility] which he we will renew today.  Continues to smoke marijuana 3 times per week on average with social alcohol and tobacco use.  Condoms offered and provided.  Interested in the monkeypox vaccine.    No Known Allergies    Outpatient Medications Prior to Visit  Medication Sig Dispense Refill   acetaminophen (TYLENOL) 500 MG tablet Take 1 tablet (500 mg total) by mouth every 6 (six) hours as needed. 30 tablet 0   albuterol (PROVENTIL HFA;VENTOLIN HFA) 108 (90 Base) MCG/ACT inhaler  Inhale 2 puffs into the lungs every 4 (four) hours as needed for wheezing or shortness of breath. 1 Inhaler 0   dicyclomine (BENTYL) 20 MG tablet Take 1 tablet (20 mg total) by mouth 2 (two) times daily. 20 tablet 0   escitalopram (LEXAPRO) 10 MG tablet Take 1 tablet (10 mg total) by mouth daily. 30 tablet 0   fluticasone (FLONASE) 50 MCG/ACT nasal spray Place 1 spray into both nostrils daily. 16 g 2   glucose blood test strip USE AS INSTRUCTED 300 strip 3   insulin aspart (NOVOLOG FLEXPEN) 100 UNIT/ML FlexPen Inject 15 Units into the skin 3 (three) times daily. 15 mL 3   Insulin Pen Needle (PEN NEEDLES) 31G X 5 MM MISC 1 Units by Does not apply route 4 (four) times daily -  before meals and at bedtime. 100 each 11   Insulin Pen Needle 31G X 5 MM MISC USE AS DIRECTED 4 (FOUR) TIMES DAILY - BEFORE MEALS AND AT BEDTIME. 100 each 11   Lancets (ONETOUCH ULTRASOFT) lancets Use as instructed 100 each 12   Lancets MISC 1 Units by Does not apply route 4 (four) times daily -  before meals and at bedtime. 200 each 11   LEVEMIR FLEXTOUCH 100 UNIT/ML FlexTouch Pen INJECT 20 UNITS INTO THE SKIN AT BEDTIME 15 mL 3   lisinopril (ZESTRIL) 20 MG tablet TAKE 2 TABLETS (40 MG TOTAL) BY MOUTH DAILY. 60 tablet 2   metoCLOPramide (REGLAN) 10 MG tablet Take 1  tablet (10 mg total) by mouth every 6 (six) hours. 30 tablet 0   Microlet Lancets MISC USE AS DIRECTED 4 TIMES DAILY - BEFORE MEALS AND AT BEDTIME 200 each 11   ondansetron (ZOFRAN ODT) 4 MG disintegrating tablet Take 1 tablet (4 mg total) by mouth every 8 (eight) hours as needed for nausea or vomiting. 6 tablet 0   sodium fluoride (PREVIDENT 5000 PLUS) 1.1 % CREA dental cream BRUSH ON TEETH WITH A TOOTHBRUSH AFTER EVENING MOUTH CARE. SPIT OUT EXCESS AND DO NOT RINSE. 51 g 3   bictegravir-emtricitabine-tenofovir AF (BIKTARVY) 50-200-25 MG TABS tablet Take 1 tablet by mouth daily. 30 tablet 5   doxycycline (VIBRA-TABS) 100 MG tablet Take 1 tablet (100 mg total) by  mouth 2 (two) times daily. (Patient not taking: Reported on 01/15/2021) 20 tablet 0   No facility-administered medications prior to visit.     Past Medical History:  Diagnosis Date   Asthma    Depression 02/24/2017   Diabetes mellitus 2010   HCAP (healthcare-associated pneumonia) 08/31/2014   HIV disease (HCC) 09/03/2014   Intrauterine drug exposure    IUGR (intrauterine growth restriction)    Obesity    Proctitis    Proctitis    Recurrent boils    MRSA and MSSA   Vision abnormalities      Past Surgical History:  Procedure Laterality Date   EXCISIONAL HEMORRHOIDECTOMY     FOOT SURGERY  1999   ingrown toenail removal      Review of Systems  Constitutional:  Negative for appetite change, chills, fatigue, fever and unexpected weight change.  Eyes:  Negative for visual disturbance.  Respiratory:  Negative for cough, chest tightness, shortness of breath and wheezing.   Cardiovascular:  Negative for chest pain and leg swelling.  Gastrointestinal:  Negative for abdominal pain, constipation, diarrhea, nausea and vomiting.  Genitourinary:  Negative for dysuria, flank pain, frequency, genital sores, hematuria and urgency.  Skin:  Negative for rash.  Allergic/Immunologic: Negative for immunocompromised state.  Neurological:  Negative for dizziness and headaches.     Objective:    BP 137/90   Pulse 76   Temp 97.8 F (36.6 C) (Oral)   Wt 228 lb (103.4 kg)   BMI 33.67 kg/m  Nursing note and vital signs reviewed.  Physical Exam Constitutional:      General: He is not in acute distress.    Appearance: He is well-developed.  Eyes:     Conjunctiva/sclera: Conjunctivae normal.  Cardiovascular:     Rate and Rhythm: Normal rate and regular rhythm.     Heart sounds: Normal heart sounds. No murmur heard.   No friction rub. No gallop.  Pulmonary:     Effort: Pulmonary effort is normal. No respiratory distress.     Breath sounds: Normal breath sounds. No wheezing or rales.   Chest:     Chest wall: No tenderness.  Abdominal:     General: Bowel sounds are normal.     Palpations: Abdomen is soft.     Tenderness: There is no abdominal tenderness.  Musculoskeletal:     Cervical back: Neck supple.  Lymphadenopathy:     Cervical: No cervical adenopathy.  Skin:    General: Skin is warm and dry.     Findings: No rash.  Neurological:     Mental Status: He is alert and oriented to person, place, and time.  Psychiatric:        Behavior: Behavior normal.  Thought Content: Thought content normal.        Judgment: Judgment normal.     [facility] 01/15/2021 12/24/2020 04/10/2020 04/09/2020 05/14/2019  Decreased Interest 3 3 2  0 0  Down, Depressed, Hopeless 2 3 3  0 0  PHQ - 2 Score 5 6 5  0 0  Altered sleeping 2 3 3  - -  Tired, decreased energy 2 3 3  - -  Change in appetite 2 3 2  - -  Feeling bad or failure about yourself  2 3 1  - -  Trouble concentrating 2 3 2  - -  Moving slowly or fidgety/restless 2 1 2  - -  Suicidal thoughts 3 1 0 - -  PHQ-9 Score 20 23 18  - -  Difficult doing work/chores Somewhat difficult - Very difficult - -       Assessment & Plan:    Patient Active Problem List   Diagnosis Date Noted   Abnormal CT scan 01/15/2021   Abscess of pubic region 04/15/2020   Subungual hematoma of great toe 04/10/2020   Positive RPR test 04/10/2020   Healthcare maintenance 05/14/2019   Screening for STDs (sexually transmitted diseases) 11/14/2018   Depression 02/24/2017   Eczema 02/24/2017   Chlamydia infection 10/29/2016   Herpes labialis 11/26/2015   HIV disease (HCC) 09/03/2014   Gastroparesis 08/30/2014   Diabetic neuropathy (HCC) 08/30/2014   Asthma 08/30/2014   Tobacco abuse 08/17/2014   Type 1 diabetes, uncontrolled, 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/01/2010   Goiter, unspecified 09/01/2010   Hypertension 09/01/2010     Problem List Items Addressed This Visit       Other   HIV disease (HCC) - Primary    Mr. Daphine DeutscherMartin  continues to have well-controlled virus with good adherence and tolerance to his ART regimen of Biktarvy.  No signs/symptoms of opportunistic infection.  We reviewed previous lab work and discussed plan of care.  Emphasized importance of routine follow-up.  Check blood work today.  Continue current dose of Biktarvy.  Renew financial assistance.  Plan for follow-up in 4 months or sooner if needed with lab work on the same day.      Relevant Medications   bictegravir-emtricitabine-tenofovir AF (BIKTARVY) 50-200-25 MG TABS tablet   Other Relevant Orders   Comprehensive metabolic panel   T-helper cell (CD4)- ([facility] clinic only)   HIV-1 RNA quant-no reflex-bld   Ambulatory referral to Infectious Disease   Screening for STDs (sexually transmitted diseases)   Relevant Orders   RPR   Healthcare maintenance    Discussed importance of safe sexual practices and condom usage.  Condoms offered and provided. 1st dose of monkeypox vaccine provided.       Abnormal CT scan    Mr. Laurence ComptonBarton had an abnormal CT abdomen/pelvis with rectal thickening with concern for possible mild proctitis. No current symptoms. Will refer to anal pap smear to further evaluation to guide treatment as needed.       Relevant Orders   Ambulatory referral to Infectious Disease   Other Visit Diagnoses     Need for smallpox immunization       Relevant Orders   Vaccinia,Smallpox Monkeypox vaccine(Jynneos) (Completed)   Pharmacologic therapy       Relevant Orders   Lipid panel        I have discontinued [name] R. Montano's doxycycline. I am also having him maintain his albuterol, fluticasone, acetaminophen, Lancets, sodium fluoride, glucose blood, Microlet Lancets, metoCLOPramide, dicyclomine, ondansetron, escitalopram, lisinopril, NovoLOG FlexPen, Insulin Pen Needle, Pen Needles,  onetouch ultrasoft, Levemir FlexTouch, and Biktarvy.   Meds ordered this encounter  Medications   bictegravir-emtricitabine-tenofovir AF  (BIKTARVY) 50-200-25 MG TABS tablet    Sig: Take 1 tablet by mouth daily.    Dispense:  30 tablet    Refill:  5    Order Specific Question:   Supervising Provider    Answer:   Judyann Munson [[id number]]     Follow-up: Return in about 4 months (around 05/17/2021), or if symptoms worsen or fail to improve.   Marcos Eke, MSN, FNP-C Nurse Practitioner [facility] for Infectious [facility] Main number: [phone]

## 2021-01-15 NOTE — Assessment & Plan Note (Signed)
Scott Baxter had an abnormal CT abdomen/pelvis with rectal thickening with concern for possible mild proctitis. No current symptoms. Will refer to anal pap smear to further evaluation to guide treatment as needed.

## 2021-01-15 NOTE — Assessment & Plan Note (Signed)
Mr. Scott Baxter continues to have well-controlled virus with good adherence and tolerance to his ART regimen of Biktarvy.  No signs/symptoms of opportunistic infection.  We reviewed previous lab work and discussed plan of care.  Emphasized importance of routine follow-up.  Check blood work today.  Continue current dose of Biktarvy.  Renew financial assistance.  Plan for follow-up in 4 months or sooner if needed with lab work on the same day.

## 2021-01-15 NOTE — Patient Instructions (Addendum)
Nice to see you.  We will check your lab work today.  Continue take your medication daily.  Refills have been sent to the pharmacy.  Continue to follow up with Internal Medicine.   Renew financial assistance today.  Plan for follow up in 4 months or sooner if needed with lab work on the same day.  Have a great day and stay safe!

## 2021-01-16 LAB — T-HELPER CELL (CD4) - (RCID CLINIC ONLY)
CD4 % Helper T Cell: 43 % (ref 33–65)
CD4 T Cell Abs: 740 /uL (ref 400–1790)

## 2021-01-19 LAB — COMPREHENSIVE METABOLIC PANEL
AG Ratio: 1.6 (calc) (ref 1.0–2.5)
ALT: 14 U/L (ref 9–46)
AST: 13 U/L (ref 10–40)
Albumin: 4.7 g/dL (ref 3.6–5.1)
Alkaline phosphatase (APISO): 70 U/L (ref 36–130)
BUN: 16 mg/dL (ref 7–25)
CO2: 27 mmol/L (ref 20–32)
Calcium: 9.6 mg/dL (ref 8.6–10.3)
Chloride: 100 mmol/L (ref 98–110)
Creat: 1.03 mg/dL (ref 0.60–1.24)
Globulin: 2.9 g/dL (calc) (ref 1.9–3.7)
Glucose, Bld: 239 mg/dL — ABNORMAL HIGH (ref 65–99)
Potassium: 4.5 mmol/L (ref 3.5–5.3)
Sodium: 136 mmol/L (ref 135–146)
Total Bilirubin: 0.6 mg/dL (ref 0.2–1.2)
Total Protein: 7.6 g/dL (ref 6.1–8.1)

## 2021-01-19 LAB — FLUORESCENT TREPONEMAL AB(FTA)-IGG-BLD: Fluorescent Treponemal ABS: REACTIVE — AB

## 2021-01-19 LAB — HIV-1 RNA QUANT-NO REFLEX-BLD
HIV 1 RNA Quant: <20 Copies/mL — ABNORMAL HIGH
HIV-1 RNA Quant, Log: <1.3 Log cps/mL — ABNORMAL HIGH

## 2021-01-19 LAB — LIPID PANEL
Cholesterol: 158 mg/dL
HDL: 53 mg/dL
LDL Cholesterol (Calc): 91 mg/dL
Non-HDL Cholesterol (Calc): 105 mg/dL
Total CHOL/HDL Ratio: 3 (calc)
Triglycerides: 54 mg/dL

## 2021-01-19 LAB — RPR: RPR Ser Ql: REACTIVE — AB

## 2021-01-19 LAB — RPR TITER: RPR Titer: 1:1 {titer} — ABNORMAL HIGH

## 2021-01-21 ENCOUNTER — Ambulatory Visit: Payer: Medicaid Other | Admitting: Family Medicine

## 2021-01-26 ENCOUNTER — Encounter: Payer: Self-pay | Admitting: Family

## 2021-02-12 ENCOUNTER — Ambulatory Visit: Payer: Medicaid Other

## 2021-02-26 ENCOUNTER — Other Ambulatory Visit: Payer: Self-pay

## 2021-03-27 ENCOUNTER — Other Ambulatory Visit: Payer: Self-pay | Admitting: Family Medicine

## 2021-04-24 ENCOUNTER — Other Ambulatory Visit: Payer: Self-pay | Admitting: Family Medicine

## 2021-04-24 DIAGNOSIS — I10 Essential (primary) hypertension: Secondary | ICD-10-CM

## 2021-05-01 ENCOUNTER — Other Ambulatory Visit: Payer: Self-pay

## 2021-06-02 ENCOUNTER — Ambulatory Visit: Payer: Medicaid Other | Admitting: Family

## 2021-06-03 ENCOUNTER — Other Ambulatory Visit: Payer: Self-pay | Admitting: Family Medicine

## 2021-06-03 DIAGNOSIS — I10 Essential (primary) hypertension: Secondary | ICD-10-CM

## 2021-07-15 ENCOUNTER — Other Ambulatory Visit: Payer: Self-pay | Admitting: Family Medicine

## 2021-07-15 DIAGNOSIS — I10 Essential (primary) hypertension: Secondary | ICD-10-CM

## 2021-07-30 ENCOUNTER — Other Ambulatory Visit (HOSPITAL_COMMUNITY): Payer: Self-pay

## 2021-07-30 ENCOUNTER — Other Ambulatory Visit: Payer: Self-pay | Admitting: Family Medicine

## 2021-07-31 ENCOUNTER — Ambulatory Visit: Payer: Self-pay

## 2021-07-31 ENCOUNTER — Other Ambulatory Visit: Payer: Self-pay

## 2021-07-31 DIAGNOSIS — B20 Human immunodeficiency virus [HIV] disease: Secondary | ICD-10-CM

## 2021-07-31 DIAGNOSIS — Z113 Encounter for screening for infections with a predominantly sexual mode of transmission: Secondary | ICD-10-CM

## 2021-08-05 ENCOUNTER — Ambulatory Visit: Payer: Self-pay

## 2021-08-05 ENCOUNTER — Other Ambulatory Visit: Payer: Self-pay

## 2021-08-05 NOTE — Addendum Note (Signed)
Addended by: Tressa Busman T on: 08/05/2021 09:04 AM ? ? Modules accepted: Orders ? ?

## 2021-08-10 ENCOUNTER — Ambulatory Visit: Payer: Self-pay | Admitting: Family

## 2021-08-12 ENCOUNTER — Other Ambulatory Visit: Payer: Self-pay

## 2021-12-09 ENCOUNTER — Other Ambulatory Visit: Payer: Self-pay

## 2021-12-09 ENCOUNTER — Ambulatory Visit (INDEPENDENT_AMBULATORY_CARE_PROVIDER_SITE_OTHER): Payer: Self-pay | Admitting: Family

## 2021-12-09 ENCOUNTER — Encounter: Payer: Self-pay | Admitting: Family

## 2021-12-09 ENCOUNTER — Ambulatory Visit: Payer: Self-pay

## 2021-12-09 VITALS — BP 151/96 | HR 91 | Temp 98.0°F | Ht 69.0 in | Wt 228.0 lb

## 2021-12-09 DIAGNOSIS — E1065 Type 1 diabetes mellitus with hyperglycemia: Secondary | ICD-10-CM

## 2021-12-09 DIAGNOSIS — F331 Major depressive disorder, recurrent, moderate: Secondary | ICD-10-CM

## 2021-12-09 DIAGNOSIS — K3184 Gastroparesis: Secondary | ICD-10-CM

## 2021-12-09 DIAGNOSIS — Z659 Problem related to unspecified psychosocial circumstances: Secondary | ICD-10-CM

## 2021-12-09 DIAGNOSIS — B20 Human immunodeficiency virus [HIV] disease: Secondary | ICD-10-CM

## 2021-12-09 DIAGNOSIS — Z113 Encounter for screening for infections with a predominantly sexual mode of transmission: Secondary | ICD-10-CM

## 2021-12-09 DIAGNOSIS — I10 Essential (primary) hypertension: Secondary | ICD-10-CM

## 2021-12-09 DIAGNOSIS — Z0000 Encounter for general adult medical examination without abnormal findings: Secondary | ICD-10-CM

## 2021-12-09 DIAGNOSIS — Z609 Problem related to social environment, unspecified: Secondary | ICD-10-CM

## 2021-12-09 MED ORDER — LEVEMIR FLEXTOUCH 100 UNIT/ML ~~LOC~~ SOPN: PEN_INJECTOR | SUBCUTANEOUS | 3 refills | Status: DC

## 2021-12-09 MED ORDER — NOVOLOG FLEXPEN 100 UNIT/ML ~~LOC~~ SOPN: PEN_INJECTOR | SUBCUTANEOUS | 3 refills | Status: DC

## 2021-12-09 MED ORDER — ESCITALOPRAM OXALATE 10 MG PO TABS: 10.0000 mg | ORAL_TABLET | Freq: Every day | ORAL | 3 refills | Status: DC

## 2021-12-09 MED ORDER — BIKTARVY 50-200-25 MG PO TABS: 1.0000 | ORAL_TABLET | Freq: Every day | ORAL | 5 refills | Status: DC

## 2021-12-09 MED ORDER — LISINOPRIL 40 MG PO TABS: 40.0000 mg | ORAL_TABLET | Freq: Every day | ORAL | 3 refills | Status: DC

## 2021-12-09 NOTE — Assessment & Plan Note (Signed)
Ed is currently homeless and living out of the car. Working full time. Will send referral to [facility] for any assistance obtaining resources. Groceries provided.

## 2021-12-09 NOTE — Progress Notes (Signed)
Brief Narrative   Patient ID: Scott Baxter, male    DOB: 11/23/1995, 26 y.o.   MRN: [id number]  [name] is a 26 y/o AA male diagnosed with HIV disease in April 2016 with risk factor of MSM. Initial CD4 nadir of 270 with viral load of 4.2 million.and suspected acute infection. Genotype with no significant resistance patterns. Entered care in CDC  Stage 2. [id number] negative. No history of opportunistic infection. Previous ART history with Genvoya and now Biktarvy  Subjective:    No chief complaint on file.   HPI:  Scott Baxter is a 26 y.o. male with HIV disease last seen on 01/15/21 with well controlled virus and good adherence and tolerance to Biktarvy. Viral load was undetectable and CD4 count 740. RPR was down to 1:1 from previous treatment level of 1:16 on 04/09/20. Kidney function, liver function and electrolytes within normal ranges. Glucose elevated at 239. Here today for follow up.  [name] has been taking his medication until Monday of this week when the medication ran out. Has been having struggles recently and is currently living in his car. Working full time at [employer]. Has food insecurity and not currently eating well and feels like lacking life stability. Denies fevers, chills, night sweats, headaches, changes in vision, neck pain/stiffness, nausea, diarrhea, vomiting, lesions or rashes.  Financial assistance expired at the end of March and has been having trouble getting all of his medications. Been feeling down about current situation and denies suicidal ideations but has felt better when on Lexapro. Was unable to follow up with [facility] due to timing. Continues to have issues with is stomach from gastroparesis likely related to his poorly controlled diabetes. Condoms offered. Requesting STD testing.   No Known Allergies    Outpatient Medications Prior to Visit  Medication Sig Dispense Refill   acetaminophen (TYLENOL) 500 MG tablet Take 1  tablet (500 mg total) by mouth every 6 (six) hours as needed. 30 tablet 0   albuterol (PROVENTIL HFA;VENTOLIN HFA) 108 (90 Base) MCG/ACT inhaler Inhale 2 puffs into the lungs every 4 (four) hours as needed for wheezing or shortness of breath. 1 Inhaler 0   fluticasone (FLONASE) 50 MCG/ACT nasal spray Place 1 spray into both nostrils daily. 16 g 2   Insulin Pen Needle (PEN NEEDLES) 31G X 5 MM MISC 1 Units by Does not apply route 4 (four) times daily -  before meals and at bedtime. 100 each 11   Insulin Pen Needle 31G X 5 MM MISC USE AS DIRECTED 4 (FOUR) TIMES DAILY - BEFORE MEALS AND AT BEDTIME. 100 each 11   Lancets (ONETOUCH ULTRASOFT) lancets Use as instructed 100 each 12   Lancets MISC 1 Units by Does not apply route 4 (four) times daily -  before meals and at bedtime. 200 each 11   ondansetron (ZOFRAN ODT) 4 MG disintegrating tablet Take 1 tablet (4 mg total) by mouth every 8 (eight) hours as needed for nausea or vomiting. 6 tablet 0   bictegravir-emtricitabine-tenofovir AF (BIKTARVY) 50-200-25 MG TABS tablet Take 1 tablet by mouth daily. 30 tablet 5   escitalopram (LEXAPRO) 10 MG tablet Take 1 tablet (10 mg total) by mouth daily. 30 tablet 0   LEVEMIR FLEXTOUCH 100 UNIT/ML FlexTouch Pen INJECT 20 UNITS INTO THE SKIN AT BEDTIME 15 mL 3   lisinopril (ZESTRIL) 20 MG tablet TAKE 2 TABLETS(40 MG) BY MOUTH DAILY 60 tablet 0   NOVOLOG FLEXPEN 100 UNIT/ML FlexPen  INJECT 15 UNITS UNDER THE SKIN THREE TIMES DAILY 15 mL 0   dicyclomine (BENTYL) 20 MG tablet Take 1 tablet (20 mg total) by mouth 2 (two) times daily. (Patient not taking: Reported on 12/09/2021) 20 tablet 0   metoCLOPramide (REGLAN) 10 MG tablet Take 1 tablet (10 mg total) by mouth every 6 (six) hours. (Patient not taking: Reported on 12/09/2021) 30 tablet 0   No facility-administered medications prior to visit.     Past Medical History:  Diagnosis Date   Asthma    Depression 02/24/2017   Diabetes mellitus 2010   HCAP  (healthcare-associated pneumonia) 08/31/2014   HIV disease (HCC) 09/03/2014   Intrauterine drug exposure    IUGR (intrauterine growth restriction)    Obesity    Proctitis    Proctitis    Recurrent boils    MRSA and MSSA   Vision abnormalities      Past Surgical History:  Procedure Laterality Date   EXCISIONAL HEMORRHOIDECTOMY     FOOT SURGERY  1999   ingrown toenail removal      Review of Systems  Constitutional:  Negative for appetite change, chills, fatigue, fever and unexpected weight change.  Eyes:  Negative for visual disturbance.  Respiratory:  Negative for cough, chest tightness, shortness of breath and wheezing.   Cardiovascular:  Negative for chest pain and leg swelling.  Gastrointestinal:  Negative for abdominal pain, constipation, diarrhea, nausea and vomiting.  Genitourinary:  Negative for dysuria, flank pain, frequency, genital sores, hematuria and urgency.  Skin:  Negative for rash.  Allergic/Immunologic: Negative for immunocompromised state.  Neurological:  Negative for dizziness and headaches.      Objective:    BP (!) 151/96   Pulse 91   Temp 98 F (36.7 C)   Ht 5\' 9"  (1.753 m)   Wt 228 lb (103.4 kg)   BMI 33.67 kg/m  Nursing note and vital signs reviewed.  Physical Exam Constitutional:      General: He is not in acute distress.    Appearance: He is well-developed.  Eyes:     Conjunctiva/sclera: Conjunctivae normal.  Cardiovascular:     Rate and Rhythm: Normal rate and regular rhythm.     Heart sounds: Normal heart sounds. No murmur heard.    No friction rub. No gallop.  Pulmonary:     Effort: Pulmonary effort is normal. No respiratory distress.     Breath sounds: Normal breath sounds. No wheezing or rales.  Chest:     Chest wall: No tenderness.  Abdominal:     General: Bowel sounds are normal.     Palpations: Abdomen is soft.     Tenderness: There is no abdominal tenderness.  Musculoskeletal:     Cervical back: Neck supple.   Lymphadenopathy:     Cervical: No cervical adenopathy.  Skin:    General: Skin is warm and dry.     Findings: No rash.  Neurological:     Mental Status: He is alert and oriented to person, place, and time.  Psychiatric:        Behavior: Behavior normal.        Thought Content: Thought content normal.        Judgment: Judgment normal.         12/09/2021   11:11 AM 01/15/2021   11:04 AM 12/24/2020   12:15 PM 04/10/2020    3:46 PM 04/09/2020    2:29 PM  Depression screen PHQ 2/9  Decreased Interest 0 3 3 2  0  Down, Depressed, Hopeless 2 2 3 3  0  PHQ - 2 Score 2 5 6 5  0  Altered sleeping 1 2 3 3    Tired, decreased energy 3 2 3 3    Change in appetite 2 2 3 2    Feeling bad or failure about yourself  1 2 3 1    Trouble concentrating 3 2 3 2    Moving slowly or fidgety/restless 2 2 1 2    Suicidal thoughts 1 3 1  0   PHQ-9 Score 15 20 23 18    Difficult doing work/chores  Somewhat difficult  Very difficult        Assessment & Plan:    Patient Active Problem List   Diagnosis Date Noted   Poor social situation 12/09/2021   Abnormal CT scan 01/15/2021   Abscess of pubic region 04/15/2020   Subungual hematoma of great toe 04/10/2020   Positive RPR test 04/10/2020   Healthcare maintenance 05/14/2019   Screening for STDs (sexually transmitted diseases) 11/14/2018   Depression 02/24/2017   Eczema 02/24/2017   Chlamydia infection 10/29/2016   Herpes labialis 11/26/2015   HIV disease (HCC) 09/03/2014   Gastroparesis 08/30/2014   Diabetic neuropathy (HCC) 08/30/2014   Asthma 08/30/2014   Tobacco abuse 08/17/2014   Type 1 diabetes mellitus with hyperglycemia (HCC) 09/01/2010   Goiter, unspecified 09/01/2010   Hypertension 09/01/2010     Problem List Items Addressed This Visit       Cardiovascular and Mediastinum   Hypertension    Torri has mildly elevated blood pressure and has been off medication secondary to expiration of financial assistance. No  ophthalmologic/neurological signs/symptoms. Restart lisinopril once financial assitance renewed.       Relevant Medications   lisinopril (ZESTRIL) 40 MG tablet     Digestive   Gastroparesis    Duston has diabetic gastroparesis and abnormal changes on CT scan. Will refer to GI for possibility of colonoscopy and further evaluation and possible treatment of diabetic gastroparesis and will work on improving blood sugars in the interim.         Endocrine   Type 1 diabetes mellitus with hyperglycemia (HCC)    Ugo likely has poorly controlled diabetes secondary to not being able to get his medications from the pharmacy due to expiration of financial assistance. Has been using family member's medication to get through. Check A1c and urine micro albumin. Refill Novolog and Levemir which will be covered by [facility].        Relevant Medications   insulin aspart (NOVOLOG FLEXPEN) 100 UNIT/ML FlexPen   LEVEMIR FLEXTOUCH 100 UNIT/ML FlexTouch Pen   lisinopril (ZESTRIL) 40 MG tablet   Other Relevant Orders   HgB A1c   Urine Microalbumin w/creat. ratio     Other   HIV disease (HCC) - Primary    Vihan has less than optimal adherence to [place] with good tolerance secondary to current social situation and expiration of financial assistance. Reviewed previous lab work and discussed plan of care. Renew financial assistance today. Check lab work. Continue current dose of Biktarvy. Plan for follow up in 1 month or sooner if needed.       Relevant Medications   bictegravir-emtricitabine-tenofovir AF (BIKTARVY) 50-200-25 MG TABS tablet   Other Relevant Orders   AMB REFERRAL TO [facility]   Comprehensive metabolic panel   HIV RNA, RTPCR W/R GT (RTI, PI,INT)   T-helper cell (CD4)- ([facility] clinic only)   RPR   Depression    Javelle has an exacerbation of depression  secondary to current social situation and complicated by being off Lexapro due to expiration of financial assistance. Denies  suicidal ideations and there are no signs of psychosis. Recommend counseling and will restart Lexapro once financial assistance is approved.       Relevant Medications   escitalopram (LEXAPRO) 10 MG tablet   Screening for STDs (sexually transmitted diseases)   Relevant Orders   Urine cytology ancillary only   Cytology (oral, anal, urethral) ancillary only   Cytology (oral, anal, urethral) ancillary only   Healthcare maintenance    Discussed importance of safe sexual practice and condom use. Condoms offered.  STD testing per request.      Poor social situation    Konstantin is currently homeless and living out of the car. Working full time. Will send referral to [facility] for any assistance obtaining resources. Groceries provided.         I have discontinued Scherrie Merritts. Griess's metoCLOPramide and dicyclomine. I have also changed his NovoLOG FlexPen and lisinopril. Additionally, I am having him maintain his albuterol, fluticasone, acetaminophen, Lancets, ondansetron, Insulin Pen Needle, Pen Needles, onetouch ultrasoft, Levemir FlexTouch, escitalopram, and Biktarvy.   Meds ordered this encounter  Medications   insulin aspart (NOVOLOG FLEXPEN) 100 UNIT/ML FlexPen    Sig: INJECT 15 UNITS UNDER THE SKIN THREE TIMES DAILY    Dispense:  15 mL    Refill:  3    [facility] pending    Order Specific Question:   Supervising Provider    Answer:   SNIDER, CYNTHIA [[id number]]   LEVEMIR FLEXTOUCH 100 UNIT/ML FlexTouch Pen    Sig: INJECT 20 UNITS INTO THE SKIN AT BEDTIME    Dispense:  15 mL    Refill:  3    [facility] pending    Order Specific Question:   Supervising Provider    Answer:   Drue Second, CYNTHIA [[id number]]   lisinopril (ZESTRIL) 40 MG tablet    Sig: Take 1 tablet (40 mg total) by mouth daily.    Dispense:  30 tablet    Refill:  3    [facility] pending    Order Specific Question:   Supervising Provider    Answer:   Drue Second, CYNTHIA [[id number]]   escitalopram (LEXAPRO) 10 MG tablet    Sig: Take 1 tablet (10 mg total) by  mouth daily.    Dispense:  30 tablet    Refill:  3    [facility] pending    Order Specific Question:   Supervising Provider    Answer:   Drue Second, CYNTHIA [[id number]]   bictegravir-emtricitabine-tenofovir AF (BIKTARVY) 50-200-25 MG TABS tablet    Sig: Take 1 tablet by mouth daily.    Dispense:  30 tablet    Refill:  5    [facility] pending    Order Specific Question:   Supervising Provider    Answer:   Judyann Munson [[id number]]     Follow-up: Return in about 1 month (around 01/09/2022), or if symptoms worsen or fail to improve.   Marcos Eke, MSN, FNP-C Nurse Practitioner [facility] for Infectious Disease [facility] Main number: [phone]

## 2021-12-09 NOTE — Assessment & Plan Note (Signed)
Scott Baxter likely has poorly controlled diabetes secondary to not being able to get his medications from the pharmacy due to expiration of financial assistance. Has been using family member's medication to get through. Check A1c and urine micro albumin. Refill Novolog and Levemir which will be covered by [facility].

## 2021-12-09 NOTE — Assessment & Plan Note (Signed)
Scott Baxter has diabetic gastroparesis and abnormal changes on CT scan. Will refer to GI for possibility of colonoscopy and further evaluation and possible treatment of diabetic gastroparesis and will work on improving blood sugars in the interim.

## 2021-12-09 NOTE — Assessment & Plan Note (Signed)
Kalee has an exacerbation of depression secondary to current social situation and complicated by being off Lexapro due to expiration of financial assistance. Denies suicidal ideations and there are no signs of psychosis. Recommend counseling and will restart Lexapro once financial assistance is approved.

## 2021-12-09 NOTE — Assessment & Plan Note (Signed)
Scott Baxter has less than optimal adherence to [place] with good tolerance secondary to current social situation and expiration of financial assistance. Reviewed previous lab work and discussed plan of care. Renew financial assistance today. Check lab work. Continue current dose of Biktarvy. Plan for follow up in 1 month or sooner if needed.

## 2021-12-09 NOTE — Patient Instructions (Addendum)
Nice to see you.  We will check your lab work today.  Continue taking medications as prescribed.  Refills will be sent to the pharmacy once [facility] approved.  Plan for follow up in 1 month or sooner if needed with lab work on the same day.

## 2021-12-09 NOTE — Assessment & Plan Note (Signed)
Scott Baxter has mildly elevated blood pressure and has been off medication secondary to expiration of financial assistance. No ophthalmologic/neurological signs/symptoms. Restart lisinopril once financial assitance renewed.

## 2021-12-09 NOTE — Assessment & Plan Note (Signed)
   Discussed importance of safe sexual practice and condom use. Condoms offered.   STD testing per request.

## 2021-12-10 ENCOUNTER — Other Ambulatory Visit: Payer: Self-pay | Admitting: Pharmacist

## 2021-12-10 DIAGNOSIS — B20 Human immunodeficiency virus [HIV] disease: Secondary | ICD-10-CM

## 2021-12-10 LAB — CYTOLOGY, (ORAL, ANAL, URETHRAL) ANCILLARY ONLY
Chlamydia: NEGATIVE
Chlamydia: POSITIVE — AB
Comment: NEGATIVE
Comment: NEGATIVE
Comment: NORMAL
Comment: NORMAL
Neisseria Gonorrhea: NEGATIVE
Neisseria Gonorrhea: NEGATIVE

## 2021-12-10 LAB — URINE CYTOLOGY ANCILLARY ONLY
Chlamydia: NEGATIVE
Comment: NEGATIVE
Comment: NORMAL
Neisseria Gonorrhea: NEGATIVE

## 2021-12-10 LAB — T-HELPER CELL (CD4) - (RCID CLINIC ONLY)
CD4 % Helper T Cell: 43 % (ref 33–65)
CD4 T Cell Abs: 736 /uL (ref 400–1790)

## 2021-12-10 MED ORDER — BIKTARVY 50-200-25 MG PO TABS: 1.0000 | ORAL_TABLET | Freq: Every day | ORAL | 0 refills | Status: AC

## 2021-12-10 NOTE — Progress Notes (Signed)
Medication Samples have been provided to the patient.  Drug name: Biktarvy        Strength: 50/200/25 mg       Qty: 14 tablets (2 bottles)   LOT: [id number]   Exp.Date: 01/2024  Dosing instructions: Take one tablet by mouth once daily  The patient has been instructed regarding the correct time, dose, and frequency of taking this medication, including desired effects and most common side effects.   Raahi Korber L. Alejos Reinhardt, PharmD, BCIDP, AAHIVP, CPP Clinical Pharmacist Practitioner Infectious Diseases Clinical Pharmacist [facility] 04/21/2020, 10:07 AM  

## 2021-12-11 ENCOUNTER — Telehealth: Payer: Self-pay

## 2021-12-11 DIAGNOSIS — A749 Chlamydial infection, unspecified: Secondary | ICD-10-CM

## 2021-12-11 MED ORDER — DOXYCYCLINE HYCLATE 100 MG PO TABS: 100.0000 mg | ORAL_TABLET | Freq: Two times a day (BID) | ORAL | 0 refills | Status: DC

## 2021-12-11 NOTE — Telephone Encounter (Signed)
Rectal swab positive for chlamydia. Will route to provider.   Xara Paulding D Marks Scalera, RN  

## 2021-12-11 NOTE — Telephone Encounter (Signed)
Please inform Scott Baxter that his lab work is positive for chlamydia. Please send in a prescription for Doxycycline 100 mg tablets PO bid #[phone] to his preferred pharmacy. Thanks.

## 2021-12-11 NOTE — Telephone Encounter (Signed)
Patient returned call, relayed positive chlamydia results and need for treatment. Doxycycline sent to preferred pharmacy. Notified him that CD4 count was healthy at over 700 and viral loads is still pending.   Discussed that RPR is stable at 1:1. Patient verbalized understanding and has no further questions.    Sandie Ano, RN

## 2021-12-11 NOTE — Addendum Note (Signed)
Addended by: Linna Hoff D on: 12/11/2021 11:59 AM   Modules accepted: Orders

## 2021-12-11 NOTE — Telephone Encounter (Signed)
Called patient to relay results, no answer. Left HIPAA compliant voicemail requesting callback.   Curtis Cain D Kelsa Jaworowski, RN  

## 2021-12-14 LAB — COMPREHENSIVE METABOLIC PANEL
AG Ratio: 1.5 (calc) (ref 1.0–2.5)
ALT: 11 U/L (ref 9–46)
AST: 12 U/L (ref 10–40)
Albumin: 4.4 g/dL (ref 3.6–5.1)
Alkaline phosphatase (APISO): 79 U/L (ref 36–130)
BUN: 16 mg/dL (ref 7–25)
CO2: 26 mmol/L (ref 20–32)
Calcium: 9.7 mg/dL (ref 8.6–10.3)
Chloride: 104 mmol/L (ref 98–110)
Creat: 1.01 mg/dL (ref 0.60–1.24)
Globulin: 2.9 g/dL (calc) (ref 1.9–3.7)
Glucose, Bld: 351 mg/dL — ABNORMAL HIGH (ref 65–99)
Potassium: 4.4 mmol/L (ref 3.5–5.3)
Sodium: 138 mmol/L (ref 135–146)
Total Bilirubin: 0.7 mg/dL (ref 0.2–1.2)
Total Protein: 7.3 g/dL (ref 6.1–8.1)

## 2021-12-14 LAB — MICROALBUMIN / CREATININE URINE RATIO
Creatinine, Urine: 106 mg/dL (ref 20–320)
Microalb Creat Ratio: 88 mcg/mg creat — ABNORMAL HIGH (ref <30)
Microalb, Ur: 9.3 mg/dL

## 2021-12-14 LAB — RPR TITER: RPR Titer: 1:1 {titer} — ABNORMAL HIGH

## 2021-12-14 LAB — HIV RNA, RTPCR W/R GT (RTI, PI,INT)
HIV 1 RNA Quant: 37 copies/mL — ABNORMAL HIGH
HIV-1 RNA Quant, Log: 1.57 Log copies/mL — ABNORMAL HIGH

## 2021-12-14 LAB — RPR: RPR Ser Ql: REACTIVE — AB

## 2021-12-14 LAB — FLUORESCENT TREPONEMAL AB(FTA)-IGG-BLD: Fluorescent Treponemal ABS: REACTIVE — AB

## 2021-12-14 LAB — HEMOGLOBIN A1C
Hgb A1c MFr Bld: 12.8 % of total Hgb — ABNORMAL HIGH (ref <5.7)
Mean Plasma Glucose: 321 mg/dL
eAG (mmol/L): 17.8 mmol/L

## 2022-01-06 ENCOUNTER — Ambulatory Visit: Payer: Self-pay | Admitting: Family

## 2022-03-11 ENCOUNTER — Ambulatory Visit: Payer: Self-pay | Admitting: Family

## 2022-04-16 ENCOUNTER — Encounter: Payer: Self-pay | Admitting: Family

## 2022-04-16 ENCOUNTER — Other Ambulatory Visit: Payer: Self-pay

## 2022-04-16 ENCOUNTER — Ambulatory Visit (INDEPENDENT_AMBULATORY_CARE_PROVIDER_SITE_OTHER): Payer: Self-pay | Admitting: Family

## 2022-04-16 VITALS — BP 137/91 | HR 81 | Temp 97.7°F | Resp 16 | Ht 69.0 in | Wt 228.0 lb

## 2022-04-16 DIAGNOSIS — B20 Human immunodeficiency virus [HIV] disease: Secondary | ICD-10-CM

## 2022-04-16 DIAGNOSIS — I10 Essential (primary) hypertension: Secondary | ICD-10-CM

## 2022-04-16 DIAGNOSIS — E1065 Type 1 diabetes mellitus with hyperglycemia: Secondary | ICD-10-CM

## 2022-04-16 DIAGNOSIS — Z23 Encounter for immunization: Secondary | ICD-10-CM

## 2022-04-16 DIAGNOSIS — Z0000 Encounter for general adult medical examination without abnormal findings: Secondary | ICD-10-CM

## 2022-04-16 MED ORDER — NOVOLOG FLEXPEN 100 UNIT/ML ~~LOC~~ SOPN
PEN_INJECTOR | SUBCUTANEOUS | 3 refills | Status: DC
Start: 2022-04-16 — End: 2023-01-25

## 2022-04-16 MED ORDER — LISINOPRIL 40 MG PO TABS: 40.0000 mg | ORAL_TABLET | Freq: Every day | ORAL | 3 refills | Status: DC

## 2022-04-16 MED ORDER — LEVEMIR FLEXTOUCH 100 UNIT/ML ~~LOC~~ SOPN: 15.0000 [IU] | PEN_INJECTOR | Freq: Two times a day (BID) | SUBCUTANEOUS | 3 refills | Status: DC

## 2022-04-16 MED ORDER — BIKTARVY 50-200-25 MG PO TABS: 1.0000 | ORAL_TABLET | Freq: Every day | ORAL | 5 refills | Status: DC

## 2022-04-16 NOTE — Assessment & Plan Note (Signed)
Scott Baxter continues to have well controlled virus with good adherence and tolerance to [employer]. Reviewed previous lab work and discussed plan of care. Continue current dose of Biktarvy. Check lab work today. Plan for follow up in 1 month or sooner if needed to renew financial assistance.

## 2022-04-16 NOTE — Assessment & Plan Note (Signed)
Blood pressure elevated above goal of 120/80 with current dose of lisinopril. No neurological/ophthalmologic signs/symptoms. Continue current dose of lisinopril. Monitor blood pressure at home as able. If continues to remain elevated may need to add additional agent.

## 2022-04-16 NOTE — Assessment & Plan Note (Signed)
Scott Baxter continues to have poorly controlled diabetes with most recent A1c of 12.8 and hyperglycemic readings at home. Will increase levemir to 15 units twice daily and continue novolog at 15 units 3 times daily. Recommend we follow up with Endocrinology for assistance in management and will establish with [facility] for PCP.  Check A1c. Continue to monitor blood sugars at home. Follow up in 1 month or sooner if needed.

## 2022-04-16 NOTE — Patient Instructions (Addendum)
Nice to see you.  We will check your lab work today.  Continue to take your medication daily as prescribed.  For your levemir - take 15 units twice daily and continue with your novolog. Let me know how your blood sugars are doing next week.   Refills have been sent to the pharmacy.  Plan for follow up in 1 months or sooner if needed with lab work on the same day.  Have a great day and stay safe!  [facility] [address], [place], [place] [address] [phone]

## 2022-04-16 NOTE — Assessment & Plan Note (Signed)
Discussed importance of safe sexual practice and condom use. Condoms and STD testing offered.  Influenza vaccination updated.  

## 2022-04-16 NOTE — Progress Notes (Signed)
Brief Narrative   Patient ID: Scott Baxter, adult    DOB: 08/23/1995, 26 y.o.   MRN: [id number]  [name] is a 26 y/o AA male diagnosed with HIV disease in April 2016 with risk factor of MSM. Initial CD4 nadir of 270 with viral load of 4.2 million.and suspected acute infection. Genotype with no significant resistance patterns. Entered care in CDC  Stage 2. [id number] negative. No history of opportunistic infection. Previous ART history with Genvoya and now Biktarvy   Subjective:    Chief Complaint  Patient presents with   Follow-up    B20     HPI:  Scott Baxter is a 26 y.o. adult with HIV disease, Type 1 diabetes and hypertension last seen on 12/09/21 with well controlled virus and poorly controlled Type 1 diabetes. Viral load was undetectable and CD4 count 736 with A1c 12.8. Renal function, electrolytes and liver function within normal limits. Here today for follow up.  [name] has been doing okay since last office visit and continues to take [employer] Corporation[employer]iktarvy, Levemir, and Novolog with no adverse side effects and no problems obtaining medication from the pharmacy. Feeling well with concern for continued elevated blood sugars at home. Condoms and STD testing offered. Declines vaccines.   Denies fevers, chills, night sweats, headaches, changes in vision, neck pain/stiffness, nausea, diarrhea, vomiting, lesions or rashes.    No Known Allergies    Outpatient Medications Prior to Visit  Medication Sig Dispense Refill   acetaminophen (TYLENOL) 500 MG tablet Take 1 tablet (500 mg total) by mouth every 6 (six) hours as needed. 30 tablet 0   albuterol (PROVENTIL HFA;VENTOLIN HFA) 108 (90 Base) MCG/ACT inhaler Inhale 2 puffs into the lungs every 4 (four) hours as needed for wheezing or shortness of breath. 1 Inhaler 0   Insulin Pen Needle (PEN NEEDLES) 31G X 5 MM MISC 1 Units by Does not apply route 4 (four) times daily -  before meals and at bedtime. 100 each 11   Lancets (ONETOUCH  ULTRASOFT) lancets Use as instructed 100 each 12   Lancets MISC 1 Units by Does not apply route 4 (four) times daily -  before meals and at bedtime. 200 each 11   bictegravir-emtricitabine-tenofovir AF (BIKTARVY) 50-200-25 MG TABS tablet Take 1 tablet by mouth daily. 30 tablet 5   insulin aspart (NOVOLOG FLEXPEN) 100 UNIT/ML FlexPen INJECT 15 UNITS UNDER THE SKIN THREE TIMES DAILY 15 mL 3   LEVEMIR FLEXTOUCH 100 UNIT/ML FlexTouch Pen INJECT 20 UNITS INTO THE SKIN AT BEDTIME 15 mL 3   lisinopril (ZESTRIL) 40 MG tablet Take 1 tablet (40 mg total) by mouth daily. 30 tablet 3   doxycycline (VIBRA-TABS) 100 MG tablet Take 1 tablet (100 mg total) by mouth 2 (two) times daily. (Patient not taking: Reported on 04/16/2022) 14 tablet 0   escitalopram (LEXAPRO) 10 MG tablet Take 1 tablet (10 mg total) by mouth daily. (Patient not taking: Reported on 04/16/2022) 30 tablet 3   fluticasone (FLONASE) 50 MCG/ACT nasal spray Place 1 spray into both nostrils daily. (Patient not taking: Reported on 04/16/2022) 16 g 2   ondansetron (ZOFRAN ODT) 4 MG disintegrating tablet Take 1 tablet (4 mg total) by mouth every 8 (eight) hours as needed for nausea or vomiting. (Patient not taking: Reported on 04/16/2022) 6 tablet 0   No facility-administered medications prior to visit.     Past Medical History:  Diagnosis Date   Asthma    Depression 02/24/2017   Diabetes mellitus  2010   HCAP (healthcare-associated pneumonia) 08/31/2014   HIV disease ([place]) 09/03/2014   Intrauterine drug exposure    IUGR (intrauterine growth restriction)    Obesity    Proctitis    Proctitis    Recurrent boils    MRSA and MSSA   Vision abnormalities      Past Surgical History:  Procedure Laterality Date   EXCISIONAL HEMORRHOIDECTOMY     FOOT SURGERY  1999   ingrown toenail removal      Review of Systems  Constitutional:  Negative for appetite change, chills, diaphoresis, fatigue, fever and unexpected weight change.  Eyes:         Negative for acute change in vision  Respiratory:  Negative for chest tightness, shortness of breath and wheezing.   Cardiovascular:  Negative for chest pain.  Gastrointestinal:  Negative for diarrhea, nausea and vomiting.  Genitourinary:  Negative for dysuria, pelvic pain and vaginal discharge.  Musculoskeletal:  Negative for neck pain and neck stiffness.  Skin:  Negative for rash.  Neurological:  Negative for seizures, syncope, weakness and headaches.  Hematological:  Negative for adenopathy. Does not bruise/bleed easily.  Psychiatric/Behavioral:  Negative for hallucinations.       Objective:    BP (!) 137/91   Pulse 81   Temp 97.7 F (36.5 C) (Oral)   Resp 16   Ht 5\' 9"  (1.753 m)   Wt 228 lb (103.4 kg)   SpO2 99%   BMI 33.67 kg/m  Nursing note and vital signs reviewed.  Physical Exam Constitutional:      General: PRINCETON CARNAHAN is not in acute distress.    Appearance: Scott Baxter is well-developed.  Eyes:     Conjunctiva/sclera: Conjunctivae normal.  Cardiovascular:     Rate and Rhythm: Normal rate and regular rhythm.     Heart sounds: Normal heart sounds. No murmur heard.    No friction rub. No gallop.  Pulmonary:     Effort: Pulmonary effort is normal. No respiratory distress.     Breath sounds: Normal breath sounds. No wheezing or rales.  Chest:     Chest wall: No tenderness.  Abdominal:     General: Bowel sounds are normal.     Palpations: Abdomen is soft.     Tenderness: There is no abdominal tenderness.  Musculoskeletal:     Cervical back: Neck supple.  Lymphadenopathy:     Cervical: No cervical adenopathy.  Skin:    General: Skin is warm and dry.     Findings: No rash.  Neurological:     Mental Status: OLEY Baxter is alert and oriented to person, place, and time.  Psychiatric:        Behavior: Behavior normal.        Thought Content: Thought content normal.        Judgment: Judgment normal.         04/16/2022   10:20 AM 12/09/2021    11:11 AM 01/15/2021   11:04 AM 12/24/2020   12:15 PM 04/10/2020    3:46 PM  Depression screen PHQ 2/9  Decreased Interest 0 0 3 3 2   Down, Depressed, Hopeless 1 2 2 3 3   PHQ - 2 Score 1 2 5 6 5   Altered sleeping  1 2 3 3   Tired, decreased energy  3 2 3 3   Change in appetite  2 2 3 2   Feeling bad or failure about yourself   1 2 3 1   Trouble concentrating  3  2 3 2   Moving slowly or fidgety/restless  2 2 1 2   Suicidal thoughts  1 3 1  0  PHQ-9 Score  15 20 23 18   Difficult doing work/chores   Somewhat difficult  Very difficult       Assessment & Plan:    Patient Active Problem List   Diagnosis Date Noted   Poor social situation 12/09/2021   Abnormal CT scan 01/15/2021   Abscess of pubic region 04/15/2020   Subungual hematoma of great toe 04/10/2020   Positive RPR test 04/10/2020   Healthcare maintenance 05/14/2019   Screening for STDs (sexually transmitted diseases) 11/14/2018   Depression 02/24/2017   Eczema 02/24/2017   Chlamydia infection 10/29/2016   Herpes labialis 11/26/2015   HIV disease ([place]) 09/03/2014   Gastroparesis 08/30/2014   Diabetic neuropathy ([place]) 08/30/2014   Asthma 08/30/2014   Tobacco abuse 08/17/2014   Type 1 diabetes mellitus with hyperglycemia ([place]) 09/01/2010   Goiter, unspecified 09/01/2010   Hypertension 09/01/2010     Problem List Items Addressed This Visit       Cardiovascular and Mediastinum   Hypertension    Blood pressure elevated above goal of 120/80 with current dose of lisinopril. No neurological/ophthalmologic signs/symptoms. Continue current dose of lisinopril. Monitor blood pressure at home as able. If continues to remain elevated may need to add additional agent.       Relevant Medications   lisinopril (ZESTRIL) 40 MG tablet     Endocrine   Type 1 diabetes mellitus with hyperglycemia ([place])    Lyric continues to have poorly controlled diabetes with most recent A1c of 12.8 and hyperglycemic readings at home. Will increase  levemir to 15 units twice daily and continue novolog at 15 units 3 times daily. Recommend we follow up with Endocrinology for assistance in management and will establish with [facility] for PCP.  Check A1c. Continue to monitor blood sugars at home. Follow up in 1 month or sooner if needed.       Relevant Medications   insulin aspart (NOVOLOG FLEXPEN) 100 UNIT/ML FlexPen   LEVEMIR FLEXTOUCH 100 UNIT/ML FlexTouch Pen   lisinopril (ZESTRIL) 40 MG tablet   Other Relevant Orders   HgB A1c     Other   HIV disease ([place]) - Primary    Damar continues to have well controlled virus with good adherence and tolerance to [employer]. Reviewed previous lab work and discussed plan of care. Continue current dose of Biktarvy. Check lab work today. Plan for follow up in 1 month or sooner if needed to renew financial assistance.       Relevant Medications   bictegravir-emtricitabine-tenofovir AF (BIKTARVY) 50-200-25 MG TABS tablet   Other Relevant Orders   COMPLETE METABOLIC PANEL WITH GFR   HIV-1 RNA quant-no reflex-bld   T-helper cells (CD4) count (not at [facility])   Healthcare maintenance    Discussed importance of safe sexual practice and condom use. Condoms and STD testing offered.  Influenza vaccination updated.       Other Visit Diagnoses     Need for immunization against influenza       Relevant Orders   Flu Vaccine QUAD 73mo+IM (Fluarix, Fluzone & Alfiuria Quad PF) (Completed)        I have discontinued Pleas Patricia R. Obar's fluticasone, ondansetron, escitalopram, and doxycycline. I have also changed Helmut Muster. Twaddell's Levemir FlexTouch. Additionally, I am having Daron Offer maintain Pleas Patricia R. Zucker's albuterol, acetaminophen, Lancets, Pen Needles, onetouch ultrasoft, Biktarvy, NovoLOG FlexPen, and  lisinopril.   Meds ordered this encounter  Medications   bictegravir-emtricitabine-tenofovir AF (BIKTARVY) 50-200-25 MG TABS tablet    Sig: Take 1 tablet by mouth daily.     Dispense:  30 tablet    Refill:  5    Order Specific Question:   Supervising Provider    Answer:   Drue Second, CYNTHIA [[id number]]   insulin aspart (NOVOLOG FLEXPEN) 100 UNIT/ML FlexPen    Sig: INJECT 15 UNITS UNDER THE SKIN THREE TIMES DAILY    Dispense:  15 mL    Refill:  3    Order Specific Question:   Supervising Provider    Answer:   SNIDER, CYNTHIA [[id number]]   LEVEMIR FLEXTOUCH 100 UNIT/ML FlexTouch Pen    Sig: Inject 15 Units into the skin 2 (two) times daily.    Dispense:  15 mL    Refill:  3    Order Specific Question:   Supervising Provider    Answer:   Drue Second, CYNTHIA [[id number]]   lisinopril (ZESTRIL) 40 MG tablet    Sig: Take 1 tablet (40 mg total) by mouth daily.    Dispense:  30 tablet    Refill:  3    Order Specific Question:   Supervising Provider    Answer:   Judyann Munson [[id number]]     Follow-up: Return in about 1 month (around 05/17/2022), or if symptoms worsen or fail to improve.   Marcos Eke, MSN, FNP-C Nurse Practitioner [facility] Main number: [phone]

## 2022-04-18 LAB — COMPLETE METABOLIC PANEL WITH GFR
AG Ratio: 1.5 (calc) (ref 1.0–2.5)
ALT: 15 U/L (ref 9–46)
AST: 15 U/L (ref 10–40)
Albumin: 4.5 g/dL (ref 3.6–5.1)
Alkaline phosphatase (APISO): 74 U/L (ref 36–130)
BUN: 20 mg/dL (ref 7–25)
CO2: 24 mmol/L (ref 20–32)
Calcium: 9.8 mg/dL (ref 8.6–10.3)
Chloride: 101 mmol/L (ref 98–110)
Creat: 1.16 mg/dL (ref 0.60–1.24)
Globulin: 3.1 g/dL (calc) (ref 1.9–3.7)
Glucose, Bld: 395 mg/dL — ABNORMAL HIGH (ref 65–99)
Potassium: 4.6 mmol/L (ref 3.5–5.3)
Sodium: 134 mmol/L — ABNORMAL LOW (ref 135–146)
Total Bilirubin: 0.5 mg/dL (ref 0.2–1.2)
Total Protein: 7.6 g/dL (ref 6.1–8.1)
eGFR: 89 mL/min/{1.73_m2} (ref >=60)

## 2022-04-18 LAB — HIV-1 RNA QUANT-NO REFLEX-BLD
HIV 1 RNA Quant: NOT DETECTED Copies/mL
HIV-1 RNA Quant, Log: NOT DETECTED Log cps/mL

## 2022-04-18 LAB — HEMOGLOBIN A1C
Hgb A1c MFr Bld: 12.4 % of total Hgb — ABNORMAL HIGH (ref <5.7)
Mean Plasma Glucose: 309 mg/dL
eAG (mmol/L): 17.1 mmol/L

## 2022-04-18 LAB — T-HELPER CELLS (CD4) COUNT (NOT AT ARMC)
Absolute CD4: 762 cells/uL (ref 490–1740)
CD4 T Helper %: 40 % (ref 30–61)
Total lymphocyte count: 1886 cells/uL (ref 850–3900)

## 2022-05-12 ENCOUNTER — Ambulatory Visit: Payer: Self-pay | Admitting: Family

## 2022-07-21 ENCOUNTER — Telehealth: Payer: Self-pay

## 2022-07-21 NOTE — Progress Notes (Signed)
Patient was referred to the pharmacist by a quality report for assistance in transitioning from Levemir to an alternative basal insulin as [employer] will no longer be Ec[employer].  Unable to reach patient. Left HIPAA compliant message for patient to return my call at their convenience.    Joseph Art, Pharm.D. PGY-2 Ambulatory Care Pharmacy Resident

## 2022-07-26 ENCOUNTER — Other Ambulatory Visit: Payer: Self-pay

## 2022-07-26 DIAGNOSIS — Z113 Encounter for screening for infections with a predominantly sexual mode of transmission: Secondary | ICD-10-CM

## 2022-07-26 DIAGNOSIS — B20 Human immunodeficiency virus [HIV] disease: Secondary | ICD-10-CM

## 2022-07-27 LAB — T-HELPER CELL (CD4) - (RCID CLINIC ONLY)
CD4 % Helper T Cell: 45 % (ref 33–65)
CD4 T Cell Abs: 842 /uL (ref 400–1790)

## 2022-07-27 LAB — URINE CYTOLOGY ANCILLARY ONLY
Chlamydia: NEGATIVE
Comment: NEGATIVE
Comment: NORMAL
Neisseria Gonorrhea: NEGATIVE

## 2022-07-29 LAB — RPR: RPR Ser Ql: NONREACTIVE

## 2022-07-29 LAB — LIPID PANEL
Cholesterol: 161 mg/dL (ref <200)
HDL: 54 mg/dL (ref >=40)
LDL Cholesterol (Calc): 92 mg/dL (calc)
Non-HDL Cholesterol (Calc): 107 mg/dL (calc) (ref <130)
Total CHOL/HDL Ratio: 3 (calc) (ref <5.0)
Triglycerides: 67 mg/dL (ref <150)

## 2022-07-29 LAB — HIV-1 RNA QUANT-NO REFLEX-BLD
HIV 1 RNA Quant: NOT DETECTED Copies/mL
HIV-1 RNA Quant, Log: NOT DETECTED Log cps/mL

## 2022-07-29 LAB — CBC WITH DIFFERENTIAL/PLATELET
Absolute Monocytes: 378 cells/uL (ref 200–950)
Basophils Absolute: 18 cells/uL (ref 0–200)
Basophils Relative: 0.4 %
Eosinophils Absolute: 59 cells/uL (ref 15–500)
Eosinophils Relative: 1.3 %
HCT: 46.5 % (ref 38.5–50.0)
Hemoglobin: 15 g/dL (ref 13.2–17.1)
Lymphs Abs: 2133 cells/uL (ref 850–3900)
MCH: 27.6 pg (ref 27.0–33.0)
MCHC: 32.3 g/dL (ref 32.0–36.0)
MCV: 85.6 fL (ref 80.0–100.0)
MPV: 12.5 fL (ref 7.5–12.5)
Monocytes Relative: 8.4 %
Neutro Abs: 1913 cells/uL (ref 1500–7800)
Neutrophils Relative %: 42.5 %
Platelets: 203 10*3/uL (ref 140–400)
RBC: 5.43 10*6/uL (ref 4.20–5.80)
RDW: 13.3 % (ref 11.0–15.0)
Total Lymphocyte: 47.4 %
WBC: 4.5 10*3/uL (ref 3.8–10.8)

## 2022-07-29 LAB — COMPLETE METABOLIC PANEL WITH GFR
AG Ratio: 1.6 (calc) (ref 1.0–2.5)
ALT: 15 U/L (ref 9–46)
AST: 13 U/L (ref 10–40)
Albumin: 4.4 g/dL (ref 3.6–5.1)
Alkaline phosphatase (APISO): 62 U/L (ref 36–130)
BUN: 13 mg/dL (ref 7–25)
CO2: 26 mmol/L (ref 20–32)
Calcium: 9.5 mg/dL (ref 8.6–10.3)
Chloride: 104 mmol/L (ref 98–110)
Creat: 0.98 mg/dL (ref 0.60–1.24)
Globulin: 2.7 g/dL (calc) (ref 1.9–3.7)
Glucose, Bld: 284 mg/dL — ABNORMAL HIGH (ref 65–99)
Potassium: 4.4 mmol/L (ref 3.5–5.3)
Sodium: 137 mmol/L (ref 135–146)
Total Bilirubin: 0.5 mg/dL (ref 0.2–1.2)
Total Protein: 7.1 g/dL (ref 6.1–8.1)
eGFR: 109 mL/min/{1.73_m2} (ref >=60)

## 2022-08-04 ENCOUNTER — Ambulatory Visit: Payer: Self-pay | Admitting: Family

## 2022-08-11 ENCOUNTER — Telehealth: Payer: Self-pay

## 2022-08-11 MED ORDER — INSULIN GLARGINE 100 UNIT/ML ~~LOC~~ SOLN: 30.0000 [IU] | Freq: Every day | SUBCUTANEOUS | 1 refills | Status: DC

## 2022-08-11 NOTE — Telephone Encounter (Signed)
Levemir discontinued and changed to Lantus 30 units daily.

## 2022-08-11 NOTE — Telephone Encounter (Signed)
Received call from [facility] to notify office that Levemir Flextouch is no longer covered by [employer]. Requested alternative is Horticulturist, commercial. Will route to provider.   Beryle Flock, RN

## 2022-08-11 NOTE — Addendum Note (Signed)
Addended by: Mauricio Po D on: 08/11/2022 12:08 PM   Modules accepted: Orders

## 2022-08-27 ENCOUNTER — Ambulatory Visit (INDEPENDENT_AMBULATORY_CARE_PROVIDER_SITE_OTHER): Payer: Self-pay | Admitting: Family

## 2022-08-27 ENCOUNTER — Encounter: Payer: Self-pay | Admitting: Family

## 2022-08-27 ENCOUNTER — Other Ambulatory Visit: Payer: Self-pay

## 2022-08-27 VITALS — BP 129/85 | HR 60 | Temp 97.5°F | Ht 69.0 in | Wt 237.0 lb

## 2022-08-27 DIAGNOSIS — Z0000 Encounter for general adult medical examination without abnormal findings: Secondary | ICD-10-CM

## 2022-08-27 DIAGNOSIS — Z113 Encounter for screening for infections with a predominantly sexual mode of transmission: Secondary | ICD-10-CM

## 2022-08-27 DIAGNOSIS — I10 Essential (primary) hypertension: Secondary | ICD-10-CM

## 2022-08-27 DIAGNOSIS — Z720 Tobacco use: Secondary | ICD-10-CM

## 2022-08-27 DIAGNOSIS — E1065 Type 1 diabetes mellitus with hyperglycemia: Secondary | ICD-10-CM

## 2022-08-27 DIAGNOSIS — B20 Human immunodeficiency virus [HIV] disease: Secondary | ICD-10-CM

## 2022-08-27 MED ORDER — INSULIN GLARGINE 100 UNIT/ML SOLOSTAR PEN: 30.0000 [IU] | PEN_INJECTOR | Freq: Every day | SUBCUTANEOUS | 11 refills | Status: DC

## 2022-08-27 MED ORDER — LISINOPRIL-HYDROCHLOROTHIAZIDE 20-25 MG PO TABS
1.0000 | ORAL_TABLET | Freq: Every day | ORAL | 3 refills | Status: DC
Start: 2022-08-27 — End: 2023-01-25

## 2022-08-27 NOTE — Assessment & Plan Note (Signed)
Scott Baxter continues to have well-controlled virus with good adherence and tolerance to [employer].  Reviewed previous lab work and discussed plan of care.  Continue current dose of Biktarvy. Meet with financial team to determine eligibility for Medicaid.  Plan for follow-up in 1 month or sooner if needed.

## 2022-08-27 NOTE — Assessment & Plan Note (Signed)
Scott Baxter continues to smoke some days. Discuss importance of tobacco cessation to reduce risk of cardiovascular, respiratory and malignant disease in the future. In the pre-contemplation stage of change and not ready to quit at this time.

## 2022-08-27 NOTE — Assessment & Plan Note (Signed)
Scott Baxter continues to have poorly controlled diabetes despite good adherence and tolerance to Levemir and Novolog. Insurance no longer covering levemir and will change to Lantus 18 units twice daily with adjustment of meal coverage as needed. Will likely need referral to Endocrinology for assistance in management. Check A1c. Plan for follow up in 1 month or sooner if needed.

## 2022-08-27 NOTE — Assessment & Plan Note (Signed)
Discussed importance of safe sexual practice and condom use. Condoms and STD testing offered.  Declines vaccines  Due for dental care and appointment information placed in AVS.

## 2022-08-27 NOTE — Patient Instructions (Addendum)
Nice to see you.  Continue to take your medication daily as prescribed.  Refills have been sent to the pharmacy.  Please call [facility]) to schedule/follow up on your dental care at [phone]  Plan for follow up in 3 months or sooner if needed with lab work 1-2 weeks prior to appointment.   Have a great day and stay safe!  Start at 36 units and increase 2-4 units every 3 days as needed for fasting blood sugar >150.  Novolog 5-15 per blood sugars.

## 2022-08-27 NOTE — Assessment & Plan Note (Signed)
Blood pressure elevated above goal 130/80.  Change lisinopril 40 mg to lisinopril-hydrochlorothiazide.  Encouraged to monitor blood pressures at home as able.  No neurologic or ophthalmologic signs/symptoms.  Plan for follow-up in 1 month or sooner.

## 2022-08-27 NOTE — Progress Notes (Signed)
**Note Scott-Identified via Obfuscation** Brief Narrative   Patient ID: DEMARR Baxter, adult    DOB: 12/03/1995, 27 y.o.   MRN: [id number]  Scott Baxter is a 27 y/o AA male diagnosed with HIV disease in April 2016 with risk factor of MSM. Initial CD4 nadir of 270 with viral load of 4.2 million.and suspected acute infection. Genotype with no significant resistance patterns. Entered care in CDC  Stage 2. [id number] negative. No history of opportunistic infection. Previous ART history with Genvoya and now Biktarvy   Subjective:    Chief Complaint  Patient presents with   Follow-up   HIV Positive/AIDS   Diabetes   Hypertension    HPI:  Scott Baxter is a 27 y.o. adult with HIV disease last seen on 04/16/22 with well controlled virus and good adherence and tolerance to [employer]. Viral load was undetectable and CD4 count 762. Most recent lab work completed on 07/26/22 with viral load remaining undetectable and CD4 count of 842. STD testing was negative. Diabetes remained poorly controlled. Here today for follow up.  Scott Baxter has been doing okay since his last office visit and continues to take his Biktarvy as prescribed with no adverse side effects or problems obtaining medication from the pharmacy. Continuing to use Levemir twice daily along with Novolog for meal coverage. Morning blood sugars are around 250. Eating 2 meals per day on average. No hypoglycemic readings. Denies numbness and tingling. Condoms and STD testing offered. Declines vaccinations.   Denies fevers, chills, night sweats, headaches, changes in vision, neck pain/stiffness, nausea, diarrhea, vomiting, lesions or rashes.    No Known Allergies    Outpatient Medications Prior to Visit  Medication Sig Dispense Refill   acetaminophen (TYLENOL) 500 MG tablet Take 1 tablet (500 mg total) by mouth every 6 (six) hours as needed. 30 tablet 0   albuterol (PROVENTIL HFA;VENTOLIN HFA) 108 (90 Base) MCG/ACT inhaler Inhale 2 puffs into the lungs every 4 (four) hours as needed  for wheezing or shortness of breath. 1 Inhaler 0   bictegravir-emtricitabine-tenofovir AF (BIKTARVY) 50-200-25 MG TABS tablet Take 1 tablet by mouth daily. 30 tablet 5   insulin aspart (NOVOLOG FLEXPEN) 100 UNIT/ML FlexPen INJECT 15 UNITS UNDER THE SKIN THREE TIMES DAILY 15 mL 3   Insulin Pen Needle (PEN NEEDLES) 31G X 5 MM MISC 1 Units by Does not apply route 4 (four) times daily -  before meals and at bedtime. 100 each 11   Lancets (ONETOUCH ULTRASOFT) lancets Use as instructed 100 each 12   Lancets MISC 1 Units by Does not apply route 4 (four) times daily -  before meals and at bedtime. 200 each 11   insulin glargine (LANTUS) 100 UNIT/ML injection Inject 0.3 mLs (30 Units total) into the skin daily. 10 mL 1   lisinopril (ZESTRIL) 40 MG tablet Take 1 tablet (40 mg total) by mouth daily. 30 tablet 3   No facility-administered medications prior to visit.     Past Medical History:  Diagnosis Date   Asthma    Depression 02/24/2017   Diabetes mellitus 2010   HCAP (healthcare-associated pneumonia) 08/31/2014   HIV disease 09/03/2014   Intrauterine drug exposure    IUGR (intrauterine growth restriction)    Obesity    Proctitis    Proctitis    Recurrent boils    MRSA and MSSA   Vision abnormalities      Past Surgical History:  Procedure Laterality Date   EXCISIONAL HEMORRHOIDECTOMY     FOOT SURGERY  1999  ingrown toenail removal      Review of Systems  Constitutional:  Negative for appetite change, chills, diaphoresis, fatigue, fever and unexpected weight change.  Eyes:        Negative for acute change in vision  Respiratory:  Negative for chest tightness, shortness of breath and wheezing.   Cardiovascular:  Negative for chest pain.  Gastrointestinal:  Negative for diarrhea, nausea and vomiting.  Genitourinary:  Negative for dysuria, pelvic pain and vaginal discharge.  Musculoskeletal:  Negative for neck pain and neck stiffness.  Skin:  Negative for rash.  Neurological:   Negative for seizures, syncope, weakness and headaches.  Hematological:  Negative for adenopathy. Does not bruise/bleed easily.  Psychiatric/Behavioral:  Negative for hallucinations.       Objective:    BP 129/85   Pulse 60   Temp (!) 97.5 F (36.4 C) (Oral)   Ht 5\' 9"  (1.753 m)   Wt 237 lb (107.5 kg)   SpO2 96%   BMI 35.00 kg/m  Nursing note and vital signs reviewed.   Physical Exam Constitutional:      General: Scott Baxter is not in acute distress.    Appearance: Scott Baxter is well-developed.  Eyes:     Conjunctiva/sclera: Conjunctivae normal.  Cardiovascular:     Rate and Rhythm: Normal rate and regular rhythm.     Heart sounds: Normal heart sounds. No murmur heard.    No friction rub. No gallop.  Pulmonary:     Effort: Pulmonary effort is normal. No respiratory distress.     Breath sounds: Normal breath sounds. No wheezing or rales.  Chest:     Chest wall: No tenderness.  Abdominal:     General: Bowel sounds are normal.     Palpations: Abdomen is soft.     Tenderness: There is no abdominal tenderness.  Musculoskeletal:     Cervical back: Neck supple.  Lymphadenopathy:     Cervical: No cervical adenopathy.  Skin:    General: Skin is warm and dry.     Findings: No rash.  Neurological:     Mental Status: Scott Baxter is alert and oriented to person, place, and time.  Psychiatric:        Behavior: Behavior normal.        Thought Content: Thought content normal.        Judgment: Judgment normal.         08/27/2022    8:28 AM 04/16/2022   10:20 AM 12/09/2021   11:11 AM 01/15/2021   11:04 AM 12/24/2020   12:15 PM  Depression screen PHQ 2/9  Decreased Interest 0 0 0 3 3  Down, Depressed, Hopeless 0 1 2 2 3   PHQ - 2 Score 0 1 2 5 6   Altered sleeping   1 2 3   Tired, decreased energy   3 2 3   Change in appetite   2 2 3   Feeling bad or failure about yourself    1 2 3   Trouble concentrating   3 2 3   Moving slowly or fidgety/restless   2 2 1    Suicidal thoughts   1 3 1   PHQ-9 Score   15 20 23   Difficult doing work/chores    Somewhat difficult        Assessment & Plan:    Patient Active Problem List   Diagnosis Date Noted   Poor social situation 12/09/2021   Abnormal CT scan 01/15/2021   Abscess of pubic region 04/15/2020  Subungual hematoma of great toe 04/10/2020   Positive RPR test 04/10/2020   Healthcare maintenance 05/14/2019   Screening for STDs (sexually transmitted diseases) 11/14/2018   Depression 02/24/2017   Eczema 02/24/2017   Chlamydia infection 10/29/2016   Herpes labialis 11/26/2015   HIV disease 09/03/2014   Gastroparesis 08/30/2014   Diabetic neuropathy 08/30/2014   Asthma 08/30/2014   Tobacco abuse 08/17/2014   Type 1 diabetes mellitus with hyperglycemia 09/01/2010   Goiter, unspecified 09/01/2010   Hypertension 09/01/2010     Problem List Items Addressed This Visit       Cardiovascular and Mediastinum   Hypertension    Blood pressure elevated above goal 130/80.  Change lisinopril 40 mg to lisinopril-hydrochlorothiazide.  Encouraged to monitor blood pressures at home as able.  No neurologic or ophthalmologic signs/symptoms.  Plan for follow-up in 1 month or sooner.      Relevant Medications   lisinopril-hydrochlorothiazide (ZESTORETIC) 20-25 MG tablet     Endocrine   Type 1 diabetes mellitus with hyperglycemia    Scott Baxter continues to have poorly controlled diabetes despite good adherence and tolerance to Levemir and Novolog. Insurance no longer covering levemir and will change to Lantus 18 units twice daily with adjustment of meal coverage as needed. Will likely need referral to Endocrinology for assistance in management. Check A1c. Plan for follow up in 1 month or sooner if needed.       Relevant Medications   insulin glargine (LANTUS) 100 UNIT/ML Solostar Pen   lisinopril-hydrochlorothiazide (ZESTORETIC) 20-25 MG tablet   Other Relevant Orders   RPR     Other   Tobacco abuse     Scott Baxter continues to smoke some days. Discuss importance of tobacco cessation to reduce risk of cardiovascular, respiratory and malignant disease in the future. In the pre-contemplation stage of change and not ready to quit at this time.       HIV disease - Primary    Scott Baxter continues to have well-controlled virus with good adherence and tolerance to [employer].  Reviewed previous lab work and discussed plan of care.  Continue current dose of Biktarvy. Meet with financial team to determine eligibility for Medicaid.  Plan for follow-up in 1 month or sooner if needed.      Screening for STDs (sexually transmitted diseases)   Relevant Orders   Cytology (oral, anal, urethral) ancillary only   Cytology (oral, anal, urethral) ancillary only   Urine cytology ancillary only   Healthcare maintenance    Discussed importance of safe sexual practice and condom use. Condoms and STD testing offered.  Declines vaccines  Due for dental care and appointment information placed in AVS.         I have discontinued Scott Baxter's lisinopril and insulin glargine. I am also having Scott Baxter start on insulin glargine and lisinopril-hydrochlorothiazide. Additionally, I am having Scott Baxter maintain Scott Baxter's albuterol, acetaminophen, Lancets, Pen Needles, onetouch ultrasoft, Biktarvy, and NovoLOG FlexPen.   Meds ordered this encounter  Medications   insulin glargine (LANTUS) 100 UNIT/ML Solostar Pen    Sig: Inject 30 Units into the skin daily.    Dispense:  15 mL    Refill:  11    Will pick up today; [facility]    Order Specific Question:   Supervising Provider    Answer:   Scott Baxter [[id number]]   lisinopril-hydrochlorothiazide (ZESTORETIC) 20-25 MG tablet    Sig: Take 1 tablet by mouth daily.    Dispense:  30 tablet  Refill:  3    Pick up today please    Order Specific Question:   Supervising Provider    Answer:   Scott Baxter [[id number]]     Follow-up: Return in about 3  months (around 11/26/2022), or if symptoms worsen or fail to improve.   Scott Eke, MSN, FNP-C Nurse Practitioner [facility] Main number: [phone]

## 2022-08-30 LAB — CYTOLOGY, (ORAL, ANAL, URETHRAL) ANCILLARY ONLY
Chlamydia: NEGATIVE
Chlamydia: NEGATIVE
Comment: NEGATIVE
Comment: NEGATIVE
Comment: NORMAL
Comment: NORMAL
Neisseria Gonorrhea: NEGATIVE
Neisseria Gonorrhea: NEGATIVE

## 2022-08-30 LAB — URINE CYTOLOGY ANCILLARY ONLY
Chlamydia: NEGATIVE
Comment: NEGATIVE
Comment: NORMAL
Neisseria Gonorrhea: NEGATIVE

## 2022-08-31 LAB — T PALLIDUM AB: T Pallidum Abs: POSITIVE — AB

## 2022-08-31 LAB — RPR TITER: RPR Titer: 1:1 {titer} — ABNORMAL HIGH

## 2022-08-31 LAB — RPR: RPR Ser Ql: REACTIVE — AB

## 2022-12-20 ENCOUNTER — Ambulatory Visit: Payer: Self-pay | Admitting: Family

## 2022-12-20 ENCOUNTER — Ambulatory Visit: Payer: Medicaid Other

## 2023-01-25 ENCOUNTER — Telehealth: Payer: Self-pay

## 2023-01-25 DIAGNOSIS — I10 Essential (primary) hypertension: Secondary | ICD-10-CM

## 2023-01-25 DIAGNOSIS — E1065 Type 1 diabetes mellitus with hyperglycemia: Secondary | ICD-10-CM

## 2023-01-25 MED ORDER — LISINOPRIL-HYDROCHLOROTHIAZIDE 20-25 MG PO TABS
1.0000 | ORAL_TABLET | Freq: Every day | ORAL | 5 refills | Status: DC
Start: 2023-01-25 — End: 2023-06-06

## 2023-01-25 MED ORDER — NOVOLOG FLEXPEN 100 UNIT/ML ~~LOC~~ SOPN
PEN_INJECTOR | SUBCUTANEOUS | 5 refills | Status: AC
Start: 2023-01-25 — End: ?

## 2023-01-25 NOTE — Telephone Encounter (Signed)
Spoke with patient and informed him refills have been sent to pharmacy.  Is asking if we could direct him to a PCP that sees uninsured patient's. Will send mychart message. Juanita Laster, RMA

## 2023-01-25 NOTE — Addendum Note (Signed)
Addended by: Juanita Laster on: 01/25/2023 10:45 AM   Modules accepted: Orders

## 2023-01-25 NOTE — Telephone Encounter (Signed)
Patient called office requesting refill on Novolog and Linsinopril. Would like refills sent to [facility] on [address]. Does not have a PCP who can manage these prescriptions. Requested patient establish care with PCP to help manage his BP and diabetes.  Verbalized understanding. Juanita Laster, RMA

## 2023-02-03 ENCOUNTER — Ambulatory Visit (HOSPITAL_COMMUNITY)
Admission: EM | Admit: 2023-02-03 | Discharge: 2023-02-03 | Disposition: A | Discharge status: Home / Self Care | Payer: Self-pay | Attending: Family Medicine | Admitting: Family Medicine

## 2023-02-03 ENCOUNTER — Other Ambulatory Visit: Payer: Self-pay

## 2023-02-03 ENCOUNTER — Encounter (HOSPITAL_COMMUNITY): Payer: Self-pay | Admitting: Emergency Medicine

## 2023-02-03 ENCOUNTER — Emergency Department (HOSPITAL_COMMUNITY)
Admission: EM | Admit: 2023-02-03 | Discharge: 2023-02-03 | Disposition: A | Discharge status: Home / Self Care | Payer: Self-pay | Attending: Emergency Medicine | Admitting: Emergency Medicine

## 2023-02-03 ENCOUNTER — Encounter (HOSPITAL_COMMUNITY): Payer: Self-pay

## 2023-02-03 ENCOUNTER — Ambulatory Visit (INDEPENDENT_AMBULATORY_CARE_PROVIDER_SITE_OTHER): Payer: Self-pay

## 2023-02-03 ENCOUNTER — Emergency Department (HOSPITAL_COMMUNITY): Payer: Medicaid Other

## 2023-02-03 DIAGNOSIS — E119 Type 2 diabetes mellitus without complications: Secondary | ICD-10-CM | POA: Insufficient documentation

## 2023-02-03 DIAGNOSIS — J45909 Unspecified asthma, uncomplicated: Secondary | ICD-10-CM | POA: Insufficient documentation

## 2023-02-03 DIAGNOSIS — R1031 Right lower quadrant pain: Secondary | ICD-10-CM | POA: Insufficient documentation

## 2023-02-03 DIAGNOSIS — R109 Unspecified abdominal pain: Secondary | ICD-10-CM

## 2023-02-03 DIAGNOSIS — Z794 Long term (current) use of insulin: Secondary | ICD-10-CM | POA: Insufficient documentation

## 2023-02-03 DIAGNOSIS — Z21 Asymptomatic human immunodeficiency virus [HIV] infection status: Secondary | ICD-10-CM | POA: Insufficient documentation

## 2023-02-03 DIAGNOSIS — R739 Hyperglycemia, unspecified: Secondary | ICD-10-CM

## 2023-02-03 LAB — COMPREHENSIVE METABOLIC PANEL
ALT: 22 U/L (ref 0–44)
AST: 25 U/L (ref 15–41)
Albumin: 4.3 g/dL (ref 3.5–5.0)
Alkaline Phosphatase: 64 U/L (ref 38–126)
Anion gap: 9 (ref 5–15)
BUN: 16 mg/dL (ref 6–20)
CO2: 24 mmol/L (ref 22–32)
Calcium: 8.8 mg/dL — ABNORMAL LOW (ref 8.9–10.3)
Chloride: 98 mmol/L (ref 98–111)
Creatinine, Ser: 1.16 mg/dL (ref 0.61–1.24)
GFR, Estimated: >60 mL/min (ref >60)
Glucose, Bld: 293 mg/dL — ABNORMAL HIGH (ref 70–99)
Potassium: 4.8 mmol/L (ref 3.5–5.1)
Sodium: 131 mmol/L — ABNORMAL LOW (ref 135–145)
Total Bilirubin: 1.2 mg/dL (ref 0.3–1.2)
Total Protein: 8.3 g/dL — ABNORMAL HIGH (ref 6.5–8.1)

## 2023-02-03 LAB — URINALYSIS, ROUTINE W REFLEX MICROSCOPIC
Bacteria, UA: NONE SEEN
Bilirubin Urine: NEGATIVE
Glucose, UA: >=500 mg/dL — AB
Ketones, ur: 20 mg/dL — AB
Leukocytes,Ua: NEGATIVE
Nitrite: NEGATIVE
Protein, ur: 30 mg/dL — AB
Specific Gravity, Urine: 1.028 (ref 1.005–1.030)
pH: 5 (ref 5.0–8.0)

## 2023-02-03 LAB — POCT URINALYSIS DIP (MANUAL ENTRY)
Bilirubin, UA: NEGATIVE
Glucose, UA: 100 mg/dL — AB
Ketones, POC UA: NEGATIVE mg/dL
Leukocytes, UA: NEGATIVE
Nitrite, UA: NEGATIVE
Protein Ur, POC: 100 mg/dL — AB
Spec Grav, UA: >=1.03 — AB (ref 1.010–1.025)
Urobilinogen, UA: 2 E.U./dL — AB
pH, UA: 6 (ref 5.0–8.0)

## 2023-02-03 LAB — CBC
HCT: 45.7 % (ref 39.0–52.0)
Hemoglobin: 14.6 g/dL (ref 13.0–17.0)
MCH: 27.3 pg (ref 26.0–34.0)
MCHC: 31.9 g/dL (ref 30.0–36.0)
MCV: 85.6 fL (ref 80.0–100.0)
Platelets: 198 10*3/uL (ref 150–400)
RBC: 5.34 MIL/uL (ref 4.22–5.81)
RDW: 13.5 % (ref 11.5–15.5)
WBC: 5.2 10*3/uL (ref 4.0–10.5)
nRBC: 0 % (ref 0.0–0.2)

## 2023-02-03 LAB — LIPASE, BLOOD: Lipase: 21 U/L (ref 11–51)

## 2023-02-03 MED ORDER — IOHEXOL 300 MG/ML  SOLN
100.0000 mL | Freq: Once | INTRAMUSCULAR | Status: AC | PRN
  Administered 2023-02-03: 100 mL via INTRAVENOUS

## 2023-02-03 MED ORDER — KETOROLAC TROMETHAMINE 15 MG/ML IJ SOLN
15.0000 mg | Freq: Once | INTRAMUSCULAR | Status: AC
  Administered 2023-02-03: 15 mg via INTRAVENOUS
  Filled 2023-02-03: qty 1

## 2023-02-03 MED ORDER — METHOCARBAMOL 500 MG PO TABS: 500.0000 mg | ORAL_TABLET | Freq: Two times a day (BID) | ORAL | 0 refills | Status: DC

## 2023-02-03 MED ORDER — SODIUM CHLORIDE 0.9 % IV BOLUS
1000.0000 mL | Freq: Once | INTRAVENOUS | Status: AC
  Administered 2023-02-03: 1000 mL via INTRAVENOUS

## 2023-02-03 NOTE — ED Notes (Addendum)
27 yr M c/o right sided abd pain and discomfort with nausea and vomiting. Seen at UC earlier today, which confirmed patient had blood present in urine. Denies any previous abdominal surgeries. Hx type I diabetes and gastroparesis. Currently rates pain 7/10 on pain scale. Currently denies vomiting, but does endorse feeling nauseous. LMB 02/02/23 and does report that it was soft. Denies any further urinary sx. Denies fevers. He is alert and oriented x 4. He has equal rise and fall of the chest wall with clear lung sounds. Pending ER provider assessment and orders. Call light in reach. Bed in lowest position.

## 2023-02-03 NOTE — ED Provider Notes (Signed)
Care assumed from Scott Hick, PA-C at shift change pending CT abdomen. See his note for full HPI.  In short, patient is a 27 year old male who presents to the ED due to right flank pain x 3 days that radiates to right side of abdomen.  Patient seen in urgent care and sent to the ED due to hematuria.  No history of kidney stones.  At shift change, CT abdomen pending.  Physical Exam  BP (!) 199/116 (BP Location: Right Arm)   Pulse 63   Temp 98.1 F (36.7 C) (Oral)   Resp 18   Ht 5\' 9"  (1.753 m)   Wt 101.2 kg   SpO2 100%   BMI 32.95 kg/m   Physical Exam Vitals and nursing note reviewed.  Constitutional:      General: Adyan is not in acute distress.    Appearance: Scott Baxter is not ill-appearing.  HENT:     Head: Normocephalic.  Eyes:     Pupils: Pupils are equal, round, and reactive to light.  Cardiovascular:     Rate and Rhythm: Normal rate and regular rhythm.     Pulses: Normal pulses.     Heart sounds: Normal heart sounds. No murmur heard.    No friction rub. No gallop.  Pulmonary:     Effort: Pulmonary effort is normal.     Breath sounds: Normal breath sounds.  Abdominal:     General: Abdomen is flat. There is no distension.     Palpations: Abdomen is soft.     Tenderness: There is no abdominal tenderness. There is no guarding or rebound.  Musculoskeletal:        General: Normal range of motion.     Cervical back: Neck supple.  Skin:    General: Skin is warm and dry.  Neurological:     General: No focal deficit present.     Mental Status: Harl is alert.  Psychiatric:        Mood and Affect: Mood normal.        Behavior: Behavior normal.     Procedures  Procedures  ED Course / MDM   Clinical Course as of 02/03/23 1651  Thu Feb 03, 2023  1333 Last bowel movement this morning at 3am. Sort of constipated. Day 1 had diarrhea. 3 days of RLQ abdominal pain radiating to right flank. No current nasuea or vomiting. No fevers. No CP, SOB.  [CG]  1334 Novolog  flexpen, lantus. Sugars not well controlled.  [CG]    Clinical Course User Index [CG] Al Decant, PA-C   Medical Decision Making Amount and/or Complexity of Data Reviewed Labs: ordered. Decision-making details documented in ED Course. Radiology: ordered and independent interpretation performed. Decision-making details documented in ED Course.  Risk Prescription drug management.   4:51 PM reassessed patient at bedside.  Patient admits to significant improvement in pain.  Patient able to tolerate p.o. at bedside.  CT abdomen personally reviewed and interpreted which is negative for any acute abnormalities.  Unclear etiology of pain.  Possible MSK etiology?  Patient does admit to heavy things at work.  No rash to suggest shingles.  UA negative for signs of infection.  Low suspicion for pyelonephritis.  CBC unremarkable.  No leukocytosis.  Normal hemoglobin.  CMP significant for hyperglycemia 293 which appears to be around patient's baseline.  No anion gap.  Low suspicion for DKA.  Patient has a scheduled PCP appointment on 10/1 for his diabetes.  Currently on insulin.  Normal renal function.  Patient discharged with symptomatic treatment.  Patient stable for discharge. Strict ED precautions discussed with patient. Patient states understanding and agrees to plan. Patient discharged home in no acute distress and stable vitals  Lives at home Hx HIV/DM Has PCP       Mannie Stabile, PA-C 02/03/23 1657    Elayne Snare K, DO 02/03/23 2333

## 2023-02-03 NOTE — ED Provider Notes (Signed)
[facility]    CSN: [id number] Arrival date & time: 02/03/23  8413      History   Chief Complaint Chief Complaint  Patient presents with   Abdominal Pain   Emesis    HPI EGOR KERSHNER is a 27 y.o. adult.    Abdominal Pain Associated symptoms: constipation, diarrhea, nausea and vomiting   Emesis Associated symptoms: abdominal pain and diarrhea    Patient is here for severe pain at the right side/flank.  7/10 in pain currently.  He have a h/o gastroparesis, but has never been this bad before.  Some vomiting the first day.  He is able to drink water, but not able to eat/drink much of anything.  No fevers/chills.  Mild diarrhea/constipation.  No urinary symptoms.        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   Healthcare maintenance 05/14/2019   Screening for STDs (sexually transmitted diseases) 11/14/2018   Depression 02/24/2017   Eczema 02/24/2017   Chlamydia infection 10/29/2016   Herpes labialis 11/26/2015   HIV disease (HCC) 09/03/2014   Gastroparesis 08/30/2014   Diabetic neuropathy (HCC) 08/30/2014   Asthma 08/30/2014   Tobacco abuse 08/17/2014   Type 1 diabetes mellitus with hyperglycemia (HCC) 09/01/2010   Goiter, unspecified 09/01/2010   Hypertension 09/01/2010    Past Surgical History:  Procedure Laterality Date   EXCISIONAL HEMORRHOIDECTOMY     FOOT SURGERY  1999   ingrown toenail  removal       Home Medications    Prior to Admission medications   Medication Sig Start Date End Date Taking? Authorizing Provider  albuterol (PROVENTIL HFA;VENTOLIN HFA) 108 (90 Base) MCG/ACT inhaler Inhale 2 puffs into the lungs every 4 (four) hours as needed for wheezing or shortness of breath. 05/11/15  Yes Pisciotta, Joni Reining, PA-C  bictegravir-emtricitabine-tenofovir AF (BIKTARVY) 50-200-25 MG TABS tablet Take 1 tablet by mouth daily. 04/16/22  Yes Veryl Speak, FNP  insulin aspart (NOVOLOG FLEXPEN) 100 UNIT/ML FlexPen INJECT 15 UNITS UNDER THE SKIN THREE TIMES DAILY 01/25/23  Yes Veryl Speak, FNP  insulin glargine (LANTUS) 100 UNIT/ML Solostar Pen Inject 30 Units into the skin daily. 08/27/22  Yes Veryl Speak, FNP  lisinopril-hydrochlorothiazide (ZESTORETIC) 20-25 MG tablet Take 1 tablet by mouth daily. 01/25/23  Yes Veryl Speak, FNP  acetaminophen (TYLENOL) 500 MG tablet Take 1 tablet (500 mg total) by mouth every 6 (six) hours as needed. 02/05/20   Fayrene Helper, PA-C  Insulin Pen Needle (PEN NEEDLES) 31G X 5 MM MISC 1 Units by Does not apply route 4 (four) times daily -  before meals and at bedtime. 12/25/20   Georganna Skeans, MD  Lancets [facility] ULTRASOFT) lancets Use as instructed 12/25/20   Georganna Skeans, MD  Lancets MISC 1 Units by Does not apply route 4 (four) times daily -  before meals and  at bedtime. 04/10/20   Yvette Rack, MD    Family History Family History  Problem Relation Age of Onset   Drug abuse Mother    Heart disease Mother    Alcohol abuse Father    Diabetes Paternal Aunt    Arthritis Paternal Grandmother    Stroke Paternal Grandfather    Diabetes Paternal Grandfather     Social History Social History   Tobacco Use   Smoking status: Some Days    Current packs/day: 0.50    Types: Cigarettes   Smokeless tobacco: Never  Vaping Use   Vaping status: Never Used  Substance Use Topics   Alcohol use: Not Currently    Comment: socially    Drug use: Yes    Frequency: 3.0 times per week    Types: Marijuana     Allergies   Patient has no known allergies.   Review of Systems Review of Systems  Constitutional: Negative.   HENT: Negative.    Respiratory: Negative.    Cardiovascular: Negative.   Gastrointestinal:  Positive for abdominal pain, constipation, diarrhea, nausea and vomiting.  Musculoskeletal: Negative.   Skin: Negative.   Psychiatric/Behavioral: Negative.       Physical Exam Triage Vital Signs ED Triage Vitals  Encounter Vitals Group     BP 02/03/23 0838 (!) 146/89     Systolic BP Percentile --      Diastolic BP Percentile --      Pulse Rate 02/03/23 0838 68     Resp 02/03/23 0838 16     Temp 02/03/23 0838 98.2 F (36.8 C)     Temp Source 02/03/23 0838 Oral     SpO2 02/03/23 0838 98 %     Weight 02/03/23 0838 223 lb (101.2 kg)     Height 02/03/23 0838 5\' 9"  (1.753 m)     Head Circumference --      Peak Flow --      Pain Score 02/03/23 0837 10     Pain Loc --      Pain Education --      Exclude from Growth Chart --    No data found.  Updated Vital Signs BP (!) 146/89 (BP Location: Right Arm)   Pulse 68   Temp 98.2 F (36.8 C) (Oral)   Resp 16   Ht 5\' 9"  (1.753 m)   Wt 101.2 kg   SpO2 98%   BMI 32.93 kg/m   Visual Acuity Right Eye Distance:   Left Eye Distance:   Bilateral Distance:    Right Eye Near:   Left Eye Near:    Bilateral Near:     Physical Exam Constitutional:      General: Daouda is not in acute distress.    Appearance: Normal appearance. Jaeven is not ill-appearing.  HENT:     Mouth/Throat:     Mouth: Mucous membranes are moist.  Cardiovascular:     Rate and Rhythm: Normal rate and regular rhythm.  Pulmonary:     Effort: Pulmonary effort is normal.     Breath sounds: Normal breath sounds.  Abdominal:     Palpations: Abdomen is soft.     Tenderness: There is abdominal tenderness. There is guarding. There is no right CVA tenderness, left CVA tenderness or  rebound.     Comments: Generalized tenderness throughout the abdomen;  More tender at the right mid/lower abdomen  Musculoskeletal:     Cervical back: Normal range of motion and neck supple.  Neurological:  General: No focal deficit present.     Mental Status: Tytus is alert.  Psychiatric:        Mood and Affect: Mood normal.      UC Treatments / Results  Labs (all labs ordered are listed, but only abnormal results are displayed) Labs Reviewed  POCT URINALYSIS DIP (MANUAL ENTRY) - Abnormal; Notable for the following components:      Result Value   Clarity, UA cloudy (*)    Glucose, UA =100 (*)    Spec Grav, UA >=1.030 (*)    Blood, UA moderate (*)    Protein Ur, POC =100 (*)    Urobilinogen, UA 2.0 (*)    All other components within normal limits    EKG   Radiology No results found.  Procedures Procedures (including critical care time)  Medications Ordered in UC Medications - No data to display  Initial Impression / Assessment and Plan / UC Course  I have reviewed the triage vital signs and the nursing notes.  Pertinent labs & imaging results that were available during my care of the patient were reviewed by me and considered in my medical decision making (see chart for details).   Final Clinical Impressions(s) / UC Diagnoses   Final diagnoses:  Abdominal pain, unspecified abdominal location     Discharge Instructions      You were seen today for abdominal pain.  Your xray did not show anything concerning.  Your urine does show moderate blood.  I recommend you go to the ER for further evaluation given your level of pain.     ED Prescriptions   None    PDMP not reviewed this encounter.   Jannifer Franklin, MD 02/03/23 [phone]

## 2023-02-03 NOTE — ED Triage Notes (Signed)
Pt arrived POV c/o RLQ pain that radiates to right side and flank x3 days. Pt was seen this morning at urgent care and sent here d/t blood detected in urine sample. Pt reports n/v on day 1 of pain but none since. Denies any other symptoms.

## 2023-02-03 NOTE — ED Notes (Signed)
Discharge instructions discussed with patient. Patient voices understanding of discharge instructions and denies any questions, needs, or concerns regarding discharge. Patient able to teach back prescription information. He is stable at discharge. He has steady and equal gait at discharge. Declines wheelchair. Vital signs stable at discharge.

## 2023-02-03 NOTE — ED Triage Notes (Signed)
Abdominal Pain ; Emesis x 3 days. No known sick exposure and no one with similar symptoms. No dietary or medication changes. History of diabetes and gastroparesis .

## 2023-02-03 NOTE — Discharge Instructions (Signed)
You were seen today for abdominal pain.  Your xray did not show anything concerning.  Your urine does show moderate blood.  I recommend you go to the ER for further evaluation given your level of pain.

## 2023-02-03 NOTE — ED Provider Notes (Signed)
[place] EMERGENCY DEPARTMENT AT [facility] Provider Note   CSN: [id number] Arrival date & time: 02/03/23  4098     History  Chief Complaint  Patient presents with   Abdominal Pain    Scott Baxter is a 27 y.o. adult with medical history of HIV, diabetes, asthma, intrauterine drug exposure, proctitis.  Patient presents to ED for evaluation of right lower quadrant abdominal pain and right flank pain.  Patient states that 3 days ago he developed right lower quadrant abdominal pain which radiates to his right flank.  States the pain has been constant since this time.  Denies any alleviating or elevating factors.  Denies dysuria, penile discharge, fevers.  He is endorsing nausea and vomiting 1 day but none today.  States he last had a bowel movement this morning at 3 AM which he reports he had to strain for.  States that he did have loose stool on day 1 of symptoms.  Denies history of kidney stones.  Denies chest pain or shortness of breath.  He was seen in urgent care prior to arrival and was redirected to the ED due to having blood in his urine.  States that currently his pain 7 out of 10.  Goes on to state that he has history of gastroparesis but denies any centralized epigastric abdominal tenderness.   Abdominal Pain Associated symptoms: nausea and vomiting   Associated symptoms: no chest pain, no dysuria, no fever and no shortness of breath        Home Medications Prior to Admission medications   Medication Sig Start Date End Date Taking? Authorizing Provider  albuterol (PROVENTIL HFA;VENTOLIN HFA) 108 (90 Base) MCG/ACT inhaler Inhale 2 puffs into the lungs every 4 (four) hours as needed for wheezing or shortness of breath. 05/11/15   Pisciotta, Joni Reining, PA-C  bictegravir-emtricitabine-tenofovir AF (BIKTARVY) 50-200-25 MG TABS tablet Take 1 tablet by mouth daily. 04/16/22   Veryl Speak, FNP  insulin aspart (NOVOLOG FLEXPEN) 100 UNIT/ML FlexPen INJECT 15 UNITS UNDER  THE SKIN THREE TIMES DAILY 01/25/23   Veryl Speak, FNP  insulin glargine (LANTUS) 100 UNIT/ML Solostar Pen Inject 30 Units into the skin daily. 08/27/22   Veryl Speak, FNP  Insulin Pen Needle (PEN NEEDLES) 31G X 5 MM MISC 1 Units by Does not apply route 4 (four) times daily -  before meals and at bedtime. 12/25/20   Georganna Skeans, MD  Lancets [facility] ULTRASOFT) lancets Use as instructed 12/25/20   Georganna Skeans, MD  Lancets MISC 1 Units by Does not apply route 4 (four) times daily -  before meals and at bedtime. 04/10/20   Yvette Rack, MD  lisinopril-hydrochlorothiazide (ZESTORETIC) 20-25 MG tablet Take 1 tablet by mouth daily. 01/25/23   Veryl Speak, FNP      Allergies    Patient has no known allergies.    Review of Systems   Review of Systems  Constitutional:  Negative for fever.  Respiratory:  Negative for shortness of breath.   Cardiovascular:  Negative for chest pain.  Gastrointestinal:  Positive for abdominal pain, nausea and vomiting.  Genitourinary:  Positive for flank pain. Negative for dysuria.  All other systems reviewed and are negative.   Physical Exam Updated Vital Signs BP (!) 161/106 (BP Location: Left Arm)   Pulse 75   Temp 98.1 F (36.7 C) (Oral)   Resp 19   Ht 5\' 9"  (1.753 m)   Wt 101.2 kg   SpO2 100%  BMI 32.95 kg/m  Physical Exam Vitals and nursing note reviewed.  Constitutional:      General: Ever is not in acute distress.    Appearance: Normal appearance. Marti is not ill-appearing, toxic-appearing or diaphoretic.  HENT:     Head: Normocephalic and atraumatic.     Nose: Nose normal.     Mouth/Throat:     Mouth: Mucous membranes are moist.     Pharynx: Oropharynx is clear.  Eyes:     Extraocular Movements: Extraocular movements intact.     Conjunctiva/sclera: Conjunctivae normal.     Pupils: Pupils are equal, round, and reactive to light.  Cardiovascular:     Rate and Rhythm: Normal rate and regular rhythm.  Pulmonary:      Effort: Pulmonary effort is normal.     Breath sounds: Normal breath sounds. No wheezing.  Abdominal:     General: Abdomen is flat. Bowel sounds are normal.     Palpations: Abdomen is soft.     Tenderness: There is abdominal tenderness.     Comments: Right lower quadrant tenderness without rebound or guarding.  Musculoskeletal:     Cervical back: Normal range of motion and neck supple. No tenderness.  Skin:    General: Skin is warm and dry.     Capillary Refill: Capillary refill takes less than 2 seconds.  Neurological:     Mental Status: Jodey is alert and oriented to person, place, and time.     ED Results / Procedures / Treatments   Labs (all labs ordered are listed, but only abnormal results are displayed) Labs Reviewed  COMPREHENSIVE METABOLIC PANEL - Abnormal; Notable for the following components:      Result Value   Sodium 131 (*)    Glucose, Bld 293 (*)    Calcium 8.8 (*)    Total Protein 8.3 (*)    All other components within normal limits  LIPASE, BLOOD  CBC  URINALYSIS, ROUTINE W REFLEX MICROSCOPIC    EKG None  Radiology DG Abd 1 View  Result Date: 02/03/2023 CLINICAL DATA:  Mid right abdominal pain, flank pain EXAM: ABDOMEN - 1 VIEW COMPARISON:  CT 12/09/2020 FINDINGS: The bowel gas pattern is normal. No radio-opaque calculi or other significant radiographic abnormality are seen. IMPRESSION: Negative. Electronically Signed   By: Charlett Nose M.D.   On: 02/03/2023 10:43    Procedures Procedures   Medications Ordered in ED Medications  sodium chloride 0.9 % bolus 1,000 mL (0 mLs Intravenous Stopped 02/03/23 1455)  ketorolac (TORADOL) 15 MG/ML injection 15 mg (15 mg Intravenous Given 02/03/23 1400)  iohexol (OMNIPAQUE) 300 MG/ML solution 100 mL (100 mLs Intravenous Contrast Given 02/03/23 1428)    ED Course/ Medical Decision Making/ A&P Clinical Course as of 02/03/23 1514  Thu Feb 03, 2023  1333 Last bowel movement this morning at 3am. Sort of  constipated. Day 1 had diarrhea. 3 days of RLQ abdominal pain radiating to right flank. No current nasuea or vomiting. No fevers. No CP, SOB.  [CG]  1334 Novolog flexpen, lantus. Sugars not well controlled.  [CG]    Clinical Course User Index [CG] Al Decant, PA-C   Medical Decision Making Amount and/or Complexity of Data Reviewed Labs: ordered. Radiology: ordered.  Risk Prescription drug management.   27 year old individual presents to ED for evaluation.  Please see HPI for further details.  On examination patient is afebrile and nontachycardic.  Lung sounds are good bilaterally, nonhypoxic.  Abdomen is soft and compressible throughout with tenderness  in the right lower quadrant without rebound or guarding.  Neurological examination at baseline.  CBC without leukocytosis or anemia.  Metabolic panel shows sodium 131, glucose 293.  No elevated anion gap, no elevated creatinine.  Patient reports glucoses typically out-of-control and has been since initial diagnosis of diabetes.  Lipase WNL.  Urinalysis pending this time.  CT imaging pending at this time.  Patient was given 1 L fluid and Toradol.  Patient signed out to oncoming provider Aberman PA-C.   Final Clinical Impression(s) / ED Diagnoses Final diagnoses:  Right lower quadrant abdominal pain    Rx / DC Orders ED Discharge Orders     None         Clent Ridges 02/03/23 1514    Linwood Dibbles, MD 02/04/23 [phone]

## 2023-02-03 NOTE — Discharge Instructions (Addendum)
It was a pleasure taking care of you today.  As discussed, your CT scan was normal.  Your labs are reassuring.  I am sending you home with a muscle relaxer.  Take as needed for pain.  Muscle relaxer can cause drowsiness so do not drive or operate machinery while on the medication.  Please follow-up with PCP for further evaluation.  Your glucose was elevated.  Please have a recheck by PCP.  Return to the ER for any worsening symptoms.

## 2023-02-04 ENCOUNTER — Other Ambulatory Visit: Payer: Self-pay

## 2023-02-04 ENCOUNTER — Encounter (HOSPITAL_COMMUNITY): Payer: Self-pay

## 2023-02-04 ENCOUNTER — Emergency Department (HOSPITAL_COMMUNITY)
Admission: EM | Admit: 2023-02-04 | Discharge: 2023-02-04 | Disposition: A | Discharge status: Home / Self Care | Payer: Medicaid Other | Attending: Emergency Medicine | Admitting: Emergency Medicine

## 2023-02-04 DIAGNOSIS — Z21 Asymptomatic human immunodeficiency virus [HIV] infection status: Secondary | ICD-10-CM | POA: Insufficient documentation

## 2023-02-04 DIAGNOSIS — R109 Unspecified abdominal pain: Secondary | ICD-10-CM | POA: Insufficient documentation

## 2023-02-04 DIAGNOSIS — Z794 Long term (current) use of insulin: Secondary | ICD-10-CM | POA: Insufficient documentation

## 2023-02-04 DIAGNOSIS — E119 Type 2 diabetes mellitus without complications: Secondary | ICD-10-CM | POA: Insufficient documentation

## 2023-02-04 DIAGNOSIS — M546 Pain in thoracic spine: Secondary | ICD-10-CM | POA: Insufficient documentation

## 2023-02-04 DIAGNOSIS — R739 Hyperglycemia, unspecified: Secondary | ICD-10-CM

## 2023-02-04 LAB — CBC WITH DIFFERENTIAL/PLATELET
Abs Immature Granulocytes: 0.02 10*3/uL (ref 0.00–0.07)
Basophils Absolute: 0 10*3/uL (ref 0.0–0.1)
Basophils Relative: 0 %
Eosinophils Absolute: 0 10*3/uL (ref 0.0–0.5)
Eosinophils Relative: 1 %
HCT: 46 % (ref 39.0–52.0)
Hemoglobin: 14.9 g/dL (ref 13.0–17.0)
Immature Granulocytes: 0 %
Lymphocytes Relative: 28 %
Lymphs Abs: 1.3 10*3/uL (ref 0.7–4.0)
MCH: 27.4 pg (ref 26.0–34.0)
MCHC: 32.4 g/dL (ref 30.0–36.0)
MCV: 84.6 fL (ref 80.0–100.0)
Monocytes Absolute: 0.3 10*3/uL (ref 0.1–1.0)
Monocytes Relative: 7 %
Neutro Abs: 3 10*3/uL (ref 1.7–7.7)
Neutrophils Relative %: 64 %
Platelets: 208 10*3/uL (ref 150–400)
RBC: 5.44 MIL/uL (ref 4.22–5.81)
RDW: 13.4 % (ref 11.5–15.5)
WBC: 4.7 10*3/uL (ref 4.0–10.5)
nRBC: 0 % (ref 0.0–0.2)

## 2023-02-04 LAB — URINALYSIS, W/ REFLEX TO CULTURE (INFECTION SUSPECTED)
Bacteria, UA: NONE SEEN
Bilirubin Urine: NEGATIVE
Glucose, UA: >=500 mg/dL — AB
Ketones, ur: 20 mg/dL — AB
Leukocytes,Ua: NEGATIVE
Nitrite: NEGATIVE
Protein, ur: 30 mg/dL — AB
Specific Gravity, Urine: 1.015 (ref 1.005–1.030)
pH: 5 (ref 5.0–8.0)

## 2023-02-04 LAB — COMPREHENSIVE METABOLIC PANEL
ALT: 21 U/L (ref 0–44)
AST: 22 U/L (ref 15–41)
Albumin: 4 g/dL (ref 3.5–5.0)
Alkaline Phosphatase: 58 U/L (ref 38–126)
Anion gap: 10 (ref 5–15)
BUN: 22 mg/dL — ABNORMAL HIGH (ref 6–20)
CO2: 23 mmol/L (ref 22–32)
Calcium: 9.1 mg/dL (ref 8.9–10.3)
Chloride: 99 mmol/L (ref 98–111)
Creatinine, Ser: 1.14 mg/dL (ref 0.61–1.24)
GFR, Estimated: >60 mL/min (ref >60)
Glucose, Bld: 303 mg/dL — ABNORMAL HIGH (ref 70–99)
Potassium: 4.4 mmol/L (ref 3.5–5.1)
Sodium: 132 mmol/L — ABNORMAL LOW (ref 135–145)
Total Bilirubin: 0.9 mg/dL (ref 0.3–1.2)
Total Protein: 8.2 g/dL — ABNORMAL HIGH (ref 6.5–8.1)

## 2023-02-04 MED ORDER — NAPROXEN 375 MG PO TABS: 375.0000 mg | ORAL_TABLET | Freq: Two times a day (BID) | ORAL | 0 refills | Status: DC

## 2023-02-04 MED ORDER — SODIUM CHLORIDE 0.9 % IV BOLUS
500.0000 mL | Freq: Once | INTRAVENOUS | Status: AC
  Administered 2023-02-04: 500 mL via INTRAVENOUS

## 2023-02-04 MED ORDER — ONDANSETRON HCL 4 MG/2ML IJ SOLN
4.0000 mg | Freq: Once | INTRAMUSCULAR | Status: AC
  Administered 2023-02-04: 4 mg via INTRAVENOUS
  Filled 2023-02-04: qty 2

## 2023-02-04 MED ORDER — KETOROLAC TROMETHAMINE 30 MG/ML IJ SOLN
30.0000 mg | Freq: Once | INTRAMUSCULAR | Status: AC
  Administered 2023-02-04: 30 mg via INTRAVENOUS
  Filled 2023-02-04: qty 1

## 2023-02-04 NOTE — Discharge Instructions (Signed)
Check your blood sugars frequently at home.  Take the insulin as discussed.  Follow-up with the primary care doctor as discussed.  Return to the emergency room if you have any worsening symptoms.

## 2023-02-04 NOTE — ED Notes (Signed)
CBG 250 

## 2023-02-04 NOTE — ED Triage Notes (Signed)
Pt states they were seen here last night and d/c. Continuing to have right sided flank pain that is worsening and c/o fatigue. Nausea, denies vomiting. C/o diarrhea.

## 2023-02-04 NOTE — ED Provider Notes (Signed)
[place] EMERGENCY DEPARTMENT AT [facility] Provider Note   CSN: [id number] Arrival date & time: 02/04/23  1006     History  Chief Complaint  Patient presents with   Flank Pain    Scott Baxter is a 27 y.o. adult.  Patient is a 27 year old male with a history of diabetes, gastroparesis, HIV who presents with pain in his right flank area.  It has been going on for about the last 3 days.  It stays in the 1 area.  It does not radiate around to his abdomen.  It is worse if he lays on that side but not necessarily worse with movement.  He has had some nausea but no vomiting.  He describes it as a burning type pain.  He has had some urinary frequency.  He denies any penile discharge.  No rectal pain.  No fevers.  No respiratory symptoms.  No radiation of pain down his legs.  He states that his viral load is currently undetectable and he is compliant with his medication.  He was seen here yesterday and had labs and a CT of his abdomen pelvis which did not show any acute abnormalities.  He was discharged with prescription for a muscle relaxer.  He said his pain got worse during the night.       Home Medications Prior to Admission medications   Medication Sig Start Date End Date Taking? Authorizing Provider  naproxen (NAPROSYN) 375 MG tablet Take 1 tablet (375 mg total) by mouth 2 (two) times daily. 02/04/23  Yes Rolan Bucco, MD  albuterol (PROVENTIL HFA;VENTOLIN HFA) 108 (90 Base) MCG/ACT inhaler Inhale 2 puffs into the lungs every 4 (four) hours as needed for wheezing or shortness of breath. 05/11/15   Pisciotta, Joni Reining, PA-C  bictegravir-emtricitabine-tenofovir AF (BIKTARVY) 50-200-25 MG TABS tablet Take 1 tablet by mouth daily. 04/16/22   Veryl Speak, FNP  insulin aspart (NOVOLOG FLEXPEN) 100 UNIT/ML FlexPen INJECT 15 UNITS UNDER THE SKIN THREE TIMES DAILY 01/25/23   Veryl Speak, FNP  insulin glargine (LANTUS) 100 UNIT/ML Solostar Pen Inject 30 Units into the skin  daily. 08/27/22   Veryl Speak, FNP  Insulin Pen Needle (PEN NEEDLES) 31G X 5 MM MISC 1 Units by Does not apply route 4 (four) times daily -  before meals and at bedtime. 12/25/20   Georganna Skeans, MD  Lancets [facility] ULTRASOFT) lancets Use as instructed 12/25/20   Georganna Skeans, MD  Lancets MISC 1 Units by Does not apply route 4 (four) times daily -  before meals and at bedtime. 04/10/20   Yvette Rack, MD  lisinopril-hydrochlorothiazide (ZESTORETIC) 20-25 MG tablet Take 1 tablet by mouth daily. 01/25/23   Veryl Speak, FNP  methocarbamol (ROBAXIN) 500 MG tablet Take 1 tablet (500 mg total) by mouth 2 (two) times daily. 02/03/23   Mannie Stabile, PA-C      Allergies    Patient has no known allergies.    Review of Systems   Review of Systems  Constitutional:  Negative for chills, diaphoresis, fatigue and fever.  HENT:  Negative for congestion, rhinorrhea and sneezing.   Eyes: Negative.   Respiratory:  Negative for cough, chest tightness and shortness of breath.   Cardiovascular:  Negative for chest pain and leg swelling.  Gastrointestinal:  Positive for nausea. Negative for abdominal pain, blood in stool, diarrhea and vomiting.  Genitourinary:  Positive for flank pain and frequency. Negative for difficulty urinating, hematuria, penile discharge, penile pain,  scrotal swelling and testicular pain.  Musculoskeletal:  Positive for back pain. Negative for arthralgias.  Skin:  Negative for rash.  Neurological:  Negative for dizziness, speech difficulty, weakness, numbness and headaches.    Physical Exam Updated Vital Signs BP (!) 169/94 (BP Location: Left Arm)   Pulse 65   Temp 97.7 F (36.5 C) (Oral)   Resp 16   Ht 5\' 9"  (1.753 m)   Wt 101.2 kg   SpO2 100%   BMI 32.93 kg/m  Physical Exam Constitutional:      Appearance: Scott Baxter is well-developed.  HENT:     Head: Normocephalic and atraumatic.  Eyes:     Pupils: Pupils are equal, round, and reactive to light.   Cardiovascular:     Rate and Rhythm: Normal rate and regular rhythm.     Heart sounds: Normal heart sounds.  Pulmonary:     Effort: Pulmonary effort is normal. No respiratory distress.     Breath sounds: Normal breath sounds. No wheezing or rales.  Chest:     Chest wall: No tenderness.  Abdominal:     General: Bowel sounds are normal.     Palpations: Abdomen is soft.     Tenderness: There is no abdominal tenderness. There is no guarding or rebound.  Musculoskeletal:        General: Normal range of motion.     Cervical back: Normal range of motion and neck supple.     Comments: Positive tenderness to the right mid back area.  There is no spinal tenderness.  No overlying rashes noted.  Lymphadenopathy:     Cervical: No cervical adenopathy.  Skin:    General: Skin is warm and dry.     Findings: No rash.  Neurological:     General: No focal deficit present.     Mental Status: Scott Baxter is alert and oriented to person, place, and time.     ED Results / Procedures / Treatments   Labs (all labs ordered are listed, but only abnormal results are displayed) Labs Reviewed  COMPREHENSIVE METABOLIC PANEL - Abnormal; Notable for the following components:      Result Value   Sodium 132 (*)    Glucose, Bld 303 (*)    BUN 22 (*)    Total Protein 8.2 (*)    All other components within normal limits  URINALYSIS, W/ REFLEX TO CULTURE (INFECTION SUSPECTED) - Abnormal; Notable for the following components:   Glucose, UA >=500 (*)    Hgb urine dipstick MODERATE (*)    Ketones, ur 20 (*)    Protein, ur 30 (*)    All other components within normal limits  CBC WITH DIFFERENTIAL/PLATELET  CBG MONITORING, ED    EKG None  Radiology CT ABDOMEN PELVIS W CONTRAST  Result Date: 02/03/2023 CLINICAL DATA:  Right lower quadrant abdominal pain radiating into the flank EXAM: CT ABDOMEN AND PELVIS WITH CONTRAST TECHNIQUE: Multidetector CT imaging of the abdomen and pelvis was performed using the  standard protocol following bolus administration of intravenous contrast. RADIATION DOSE REDUCTION: This exam was performed according to the departmental dose-optimization program which includes automated exposure control, adjustment of the mA and/or kV according to patient size and/or use of iterative reconstruction technique. CONTRAST:  OMNIPAQUE IOHEXOL 300 MG/ML  SOLN COMPARISON:  Prior CT scan of the abdomen and pelvis 12/09/2020 FINDINGS: Lower chest: No acute abnormality. Hepatobiliary: No focal liver abnormality is seen. No gallstones, gallbladder wall thickening, or biliary dilatation. Pancreas: Unremarkable. No pancreatic ductal  dilatation or surrounding inflammatory changes. Spleen: Normal in size without focal abnormality. Adrenals/Urinary Tract: Adrenal glands are unremarkable. Kidneys are normal, without renal calculi, focal lesion, or hydronephrosis. Bladder is unremarkable. Stomach/Bowel: Stomach is within normal limits. Appendix appears normal. No evidence of bowel wall thickening, distention, or inflammatory changes. Vascular/Lymphatic: No significant vascular findings are present. No enlarged abdominal or pelvic lymph nodes. Reproductive: Prostate is unremarkable. Other: No abdominal wall hernia or abnormality. No abdominopelvic ascites. Musculoskeletal: No acute or significant osseous findings. IMPRESSION: 1. No acute abnormality within the abdomen or pelvis. 2. Specifically, no evidence of hydronephrosis, nephrolithiasis or appendicitis. Electronically Signed   By: Malachy Moan M.D.   On: 02/03/2023 16:32   DG Abd 1 View  Result Date: 02/03/2023 CLINICAL DATA:  Mid right abdominal pain, flank pain EXAM: ABDOMEN - 1 VIEW COMPARISON:  CT 12/09/2020 FINDINGS: The bowel gas pattern is normal. No radio-opaque calculi or other significant radiographic abnormality are seen. IMPRESSION: Negative. Electronically Signed   By: Charlett Nose M.D.   On: 02/03/2023 10:43     Procedures Procedures    Medications Ordered in ED Medications  ketorolac (TORADOL) 30 MG/ML injection 30 mg (30 mg Intravenous Given 02/04/23 1119)  sodium chloride 0.9 % bolus 500 mL (0 mLs Intravenous Stopped 02/04/23 1402)  ondansetron (ZOFRAN) injection 4 mg (4 mg Intravenous Given 02/04/23 1119)  sodium chloride 0.9 % bolus 500 mL (0 mLs Intravenous Stopped 02/04/23 1402)    ED Course/ Medical Decision Making/ A&P                                 Medical Decision Making Amount and/or Complexity of Data Reviewed Labs: ordered.  Risk Prescription drug management.   Patient is a 27 year old male who presents with pain in his right flank/mid back area.  I not see any external lesions.  Describes it as a burning type pain.  There is no radicular symptoms.  No neurologic deficits.  He had a CT scan during his visit yesterday which did not show any acute abnormality.  No stone.  No signs of gallbladder disease.  He does not have any pain over his gallbladder area or abdomen.  No pain in his inguinal canal or testicular areas.  His urine tested positive for blood but there is no RBCs.  No evidence of UTI.  Other labs are nonconcerning other than his glucose is elevated at 300.  I suspect his pain could be musculoskeletal in nature given the burning type characteristic.  He does not have any respiratory symptoms that we more concerning for other etiologies such as PE.  No signs of pneumonia on imaging.  I discussed his elevated blood sugar with him.  He was given some IV fluids.  He apparently has been running out of his Lantus due to affordability.  I have consulted with social work who has given him some medication assistance and arranged an outpatient follow-up appointment with the PCP October 14.  He was discharged home in good condition.  He was given a prescription for Naprosyn to use for the pain.  Return precautions were given.  Final Clinical Impression(s) / ED Diagnoses Final  diagnoses:  Right flank pain    Rx / DC Orders ED Discharge Orders          Ordered    naproxen (NAPROSYN) 375 MG tablet  2 times daily        02/04/23 1421  Rolan Bucco, MD 02/04/23 1426

## 2023-02-04 NOTE — Care Management (Addendum)
Transition of Care [facility]) - Emergency Department Mini Assessment   Patient Details  Name: Scott Baxter MRN: [id number] Date of Birth: 15-Dec-1995  Transition of Care [facility]) CM/SW Contact:    Lavenia Atlas, RN Phone Number: 02/04/2023, 2:02 PM   Clinical Narrative: Jayme Cloud consult for PCP needs and medication assistance. This RNCM spoke with patient at bedside who reports he sees [facility] for HIV care however does not have a PCP or endocrinologist to manage his type 1 Diabetes. This RNCM scheduled a follow up appointment with Gwinda Passe, NP at [facility] on 02/21/23 at 1:30pm, as it is the earliest available appointment. Patient is eligible for the [facility] program and prefers [facility] [address] in [place]. Patient reports he can afford the $3 copay per medication. Patient reports he does not have a glucometer this RNCM encouraged patient to obtain at [facility], will notify EDP.   No additional TOC needs at this time.   ED Mini Assessment: What brought you to the Emergency Department? : patietn c/o fatigue, right sided flank pain that is worsening  Barriers to Discharge: No Barriers Identified  Barrier interventions: coordinate PCP appointment and medication assistance  Means of departure: Car  Interventions which prevented an admission or readmission: PCP Appointment Scheduled, Medication Review    Patient Contact and Communications        ,          Patient states their goals for this hospitalization and ongoing recovery are:: to get assistance with obtaining diabetic medications CMS Medicare.gov Compare Post Acute Care list provided to:: Patient Choice offered to / list presented to : Patient  Admission diagnosis:  return visit - lower back pain, diarrhea Patient Active Problem List   Diagnosis Date Noted   Poor social situation 12/09/2021   Abnormal CT scan 01/15/2021   Abscess of pubic region 04/15/2020   Subungual hematoma of great toe 04/10/2020    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 [place], [place] - [address] [place] [address] Phone: [phone] Fax: [phone]  [id number] - [place], [place] - [address] AT [facility] OF [address] [place] [address] Phone: [phone] Fax: [phone]  [facility] [address] [place] [address] Phone: [phone] Fax: [phone]  [facility] - [facility] [address], [address] [place] [address] Phone: [phone] Fax: [phone]

## 2023-02-04 NOTE — Care Management (Signed)
MATCH Medication Assistance Card *Pharmacies please call [phone] for claim processing assistance Name: Scott Baxter                                                                                                                                                                                                                               Relationship Code:  1 ID (MRN): [id number]                                                                                                                                                                                                                          Person Code:  [id number] Bin: [id number] RX Group: [id number] Discharge Date: 02/04/2023                                 RX PCN:  [id number] Expiration Date:02/11/2023                                           (must be filled within 7 days of discharge)     You have been approved to have the prescriptions written by your discharging physician filled through our [facility] (Medication Assistance Through [facility]) program. This program allows for a one-time (no refills) 34-day supply of  selected medications for a low copay amount.  The copay is $3.00 per prescription. For instance, if you have one prescription, you will pay $3.00; for two prescriptions, you pay $6.00; for three prescriptions, you pay $9.00; and so on.  Only certain pharmacies are participating in this program with [facility]. You will need to select one of the pharmacies from the attached list and take your prescriptions, this letter, and your photo ID to one of the [facility] Outpatient pharmacies.   We are excited that you are able to use the [facility] program to get your medications. These prescriptions must be filled within 7 days of hospital discharge or they will no longer be valid for the [facility] program. Should you have any problems with your prescriptions please contact your case management team member at [phone] for [place]  [facility].  Thank you,   [facility] Care Management

## 2023-02-07 LAB — CBG MONITORING, ED: Glucose-Capillary: 250 mg/dL — ABNORMAL HIGH (ref 70–99)

## 2023-02-08 ENCOUNTER — Ambulatory Visit (INDEPENDENT_AMBULATORY_CARE_PROVIDER_SITE_OTHER): Payer: Self-pay | Admitting: Family

## 2023-02-08 ENCOUNTER — Encounter: Payer: Self-pay | Admitting: Family

## 2023-02-08 ENCOUNTER — Other Ambulatory Visit: Payer: Self-pay

## 2023-02-08 ENCOUNTER — Ambulatory Visit: Payer: Self-pay

## 2023-02-08 VITALS — BP 155/96 | HR 94 | Temp 98.2°F | Wt 238.0 lb

## 2023-02-08 DIAGNOSIS — K3184 Gastroparesis: Secondary | ICD-10-CM

## 2023-02-08 DIAGNOSIS — E1065 Type 1 diabetes mellitus with hyperglycemia: Secondary | ICD-10-CM

## 2023-02-08 DIAGNOSIS — F17210 Nicotine dependence, cigarettes, uncomplicated: Secondary | ICD-10-CM

## 2023-02-08 DIAGNOSIS — Z113 Encounter for screening for infections with a predominantly sexual mode of transmission: Secondary | ICD-10-CM

## 2023-02-08 DIAGNOSIS — Z720 Tobacco use: Secondary | ICD-10-CM

## 2023-02-08 DIAGNOSIS — B20 Human immunodeficiency virus [HIV] disease: Secondary | ICD-10-CM

## 2023-02-08 DIAGNOSIS — Z23 Encounter for immunization: Secondary | ICD-10-CM

## 2023-02-08 DIAGNOSIS — Z0000 Encounter for general adult medical examination without abnormal findings: Secondary | ICD-10-CM

## 2023-02-08 DIAGNOSIS — I10 Essential (primary) hypertension: Secondary | ICD-10-CM

## 2023-02-08 MED ORDER — BIKTARVY 50-200-25 MG PO TABS
1.0000 | ORAL_TABLET | Freq: Every day | ORAL | 5 refills | Status: DC
Start: 2023-02-08 — End: 2023-06-06

## 2023-02-08 MED ORDER — AMLODIPINE BESYLATE 10 MG PO TABS: 10.0000 mg | ORAL_TABLET | Freq: Every day | ORAL | 11 refills | Status: DC

## 2023-02-08 MED ORDER — NOVOLOG FLEXPEN 100 UNIT/ML ~~LOC~~ SOPN: PEN_INJECTOR | SUBCUTANEOUS | 5 refills | Status: DC

## 2023-02-08 MED ORDER — INSULIN GLARGINE 100 UNIT/ML SOLOSTAR PEN
34.0000 [IU] | PEN_INJECTOR | Freq: Every day | SUBCUTANEOUS | 11 refills | Status: DC
Start: 2023-02-08 — End: 2023-06-09

## 2023-02-08 MED ORDER — METOCLOPRAMIDE HCL 10 MG PO TABS: 5.0000 mg | ORAL_TABLET | Freq: Three times a day (TID) | ORAL | 3 refills | Status: DC | PRN

## 2023-02-08 NOTE — Patient Instructions (Addendum)
Nice to see you.  We will check your lab work today.  Continue to take your medication daily as prescribed.  Refills have been sent to the pharmacy.  Plan for follow up in 3 months or sooner if needed.  Have a great day and stay safe!

## 2023-02-08 NOTE — Progress Notes (Unsigned)
Brief Narrative   Patient ID: Scott Baxter, adult    DOB: Apr 23, 1996, 27 y.o.   MRN: [id number]  Clearance is a 27 y/o AA adult diagnosed with HIV disease in April 2016 with risk factor of MSM. Initial CD4 nadir of 270 with viral load of 4.2 million.and suspected acute infection. Genotype with no significant resistance patterns. Entered care in [facility]  Stage 2. [id number] negative. No history of opportunistic infection. Previous ART history with Genvoya and now Biktarvy   Subjective:    Chief Complaint  Patient presents with   Follow-up    B20 - patient reports he was in the ED on 9/27 due to having back pain. Patient is still having right flank pain that radiates to abdomen. Hasn't had a bowel movement in 2 days.     HPI:  Scott Baxter is a 27 y.o. adult with HIV disease, diabetes, and hypertension  last seen on 08/27/22 with good adherence and tolerance to medications and well controlled HIV virus.  Viral load was undetectable with CD4 count 842. Diabetes was poorly controlled with last A1c of 12.4. Renal function, liver function and electrolytes within normal ranges. Here today for routine follow up.  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 Continues to take medications as prescribed with no adverse side effects or problems obtaining medication. Would like to restart on Reglan to help with the gastroparesis. Condoms and STD testing offered. Working on establishing with primary care/endocrinology.   Denies fevers, chills, night sweats, headaches, changes in vision, neck pain/stiffness, nausea, diarrhea, vomiting, lesions or rashes.  Lab Results  Component Value Date   CD4TCELL 45 07/26/2022   CD4TABS 842 07/26/2022   Lab Results  Component Value Date   HIV1RNAQUANT Not Detected 07/26/2022     No Known Allergies    Outpatient Medications Prior to Visit  Medication Sig Dispense Refill   albuterol  (PROVENTIL HFA;VENTOLIN HFA) 108 (90 Base) MCG/ACT inhaler Inhale 2 puffs into the lungs every 4 (four) hours as needed for wheezing or shortness of breath. 1 Inhaler 0   Insulin Pen Needle (PEN NEEDLES) 31G X 5 MM MISC 1 Units by Does not apply route 4 (four) times daily -  before meals and at bedtime. 100 each 11   Lancets (ONETOUCH ULTRASOFT) lancets Use as instructed 100 each 12   Lancets MISC 1 Units by Does not apply route 4 (four) times daily -  before meals and at bedtime. 200 each 11   lisinopril-hydrochlorothiazide (ZESTORETIC) 20-25 MG tablet Take 1 tablet by mouth daily. 30 tablet 5   methocarbamol (ROBAXIN) 500 MG tablet Take 1 tablet (500 mg total) by mouth 2 (two) times daily. 20 tablet 0   naproxen (NAPROSYN) 375 MG tablet Take 1 tablet (375 mg total) by mouth 2 (two) times daily. 20 tablet 0   bictegravir-emtricitabine-tenofovir AF (BIKTARVY) 50-200-25 MG TABS tablet Take 1 tablet by mouth daily. 30 tablet 5   insulin aspart (NOVOLOG FLEXPEN) 100 UNIT/ML FlexPen INJECT 15 UNITS UNDER THE SKIN THREE TIMES DAILY 15 mL 5   insulin glargine (LANTUS) 100 UNIT/ML Solostar Pen Inject 30 Units into the skin daily. 15 mL 11   No facility-administered medications prior to visit.     Past Medical History:  Diagnosis Date   Asthma    Depression 02/24/2017   Diabetes mellitus 2010   HCAP (healthcare-associated pneumonia) 08/31/2014   HIV disease (HCC) 09/03/2014  Intrauterine drug exposure    IUGR (intrauterine growth restriction)    Obesity    Proctitis    Proctitis    Recurrent boils    MRSA and MSSA   Vision abnormalities      Past Surgical History:  Procedure Laterality Date   EXCISIONAL HEMORRHOIDECTOMY     FOOT SURGERY  1999   ingrown toenail removal      Review of Systems  Constitutional:  Negative for appetite change, chills, diaphoresis, fatigue, fever and unexpected weight change.  Eyes:        Negative for acute change in vision  Respiratory:  Negative for  chest tightness, shortness of breath and wheezing.   Cardiovascular:  Negative for chest pain.  Gastrointestinal:  Negative for diarrhea, nausea and vomiting.  Genitourinary:  Negative for dysuria, pelvic pain and vaginal discharge.  Musculoskeletal:  Negative for neck pain and neck stiffness.  Skin:  Negative for rash.  Neurological:  Negative for seizures, syncope, weakness and headaches.  Hematological:  Negative for adenopathy. Does not bruise/bleed easily.  Psychiatric/Behavioral:  Negative for hallucinations.       Objective:    BP (!) 155/96   Pulse 94   Temp 98.2 F (36.8 C) (Temporal)   Wt 238 lb (108 kg)   SpO2 98%   BMI 35.15 kg/m  Nursing note and vital signs reviewed.  Physical Exam Constitutional:      General: Scott Baxter is not in acute distress.    Appearance: Scott Baxter is well-developed.  Eyes:     Conjunctiva/sclera: Conjunctivae normal.  Cardiovascular:     Rate and Rhythm: Normal rate and regular rhythm.     Heart sounds: Normal heart sounds. No murmur heard.    No friction rub. No gallop.  Pulmonary:     Effort: Pulmonary effort is normal. No respiratory distress.     Breath sounds: Normal breath sounds. No wheezing or rales.  Chest:     Chest wall: No tenderness.  Abdominal:     General: Bowel sounds are normal.     Palpations: Abdomen is soft.     Tenderness: There is no abdominal tenderness.  Musculoskeletal:     Cervical back: Neck supple.  Lymphadenopathy:     Cervical: No cervical adenopathy.  Skin:    General: Skin is warm and dry.     Findings: No rash.  Neurological:     Mental Status: Scott Baxter is alert and oriented to person, place, and time.  Psychiatric:        Behavior: Behavior normal.        Thought Content: Thought content normal.        Judgment: Judgment normal.         02/08/2023    3:49 PM 08/27/2022    8:28 AM 04/16/2022   10:20 AM 12/09/2021   11:11 AM 01/15/2021   11:04 AM  Depression screen PHQ 2/9  Decreased Interest  0 0 0 0 3  Down, Depressed, Hopeless 0 0 1 2 2   PHQ - 2 Score 0 0 1 2 5   Altered sleeping    1 2  Tired, decreased energy    3 2  Change in appetite    2 2  Feeling bad or failure about yourself     1 2  Trouble concentrating    3 2  Moving slowly or fidgety/restless    2 2  Suicidal thoughts    1 3  PHQ-9 Score    15 20  Difficult  doing work/chores     Somewhat difficult       Assessment & Plan:    Patient Active Problem List   Diagnosis Date Noted   Poor social situation 12/09/2021   Abnormal CT scan 01/15/2021   Abscess of pubic region 04/15/2020   Subungual hematoma of great toe 04/10/2020   Positive RPR test 04/10/2020   Healthcare maintenance 05/14/2019   Screening for STDs (sexually transmitted diseases) 11/14/2018   Depression 02/24/2017   Eczema 02/24/2017   Chlamydia infection 10/29/2016   Herpes labialis 11/26/2015   HIV disease (HCC) 09/03/2014   Gastroparesis 08/30/2014   Diabetic neuropathy (HCC) 08/30/2014   Asthma 08/30/2014   Tobacco abuse 08/17/2014   Type 1 diabetes mellitus with hyperglycemia (HCC) 09/01/2010   Goiter 09/01/2010   Hypertension 09/01/2010     Problem List Items Addressed This Visit       Cardiovascular and Mediastinum   Hypertension    Blood pressure poorly controlled with current regimen. No adverse side effects or hypotensive blood pressure readings. No neurological/ophthalmologic signs/symptoms. Continue current dose of lisinopril-hydrochlorothiazide and add amlodipine. Monitor blood pressure at home as able. Encouraged tobacco cessation.       Relevant Medications   amLODipine (NORVASC) 10 MG tablet     Digestive   Gastroparesis    Having increased symptoms periodically. Will restart Reglan with adjustments per Internal Medicine/Endocrinology as needed.         Endocrine   Type 1 diabetes mellitus with hyperglycemia (HCC)    Improved glucose readings at home just above 200. Will increase Lantus and continue current  dose of Novolog. Check A1c. Working to establish with Internal Medicine/Endocrinology.       Relevant Medications   insulin glargine (LANTUS) 100 UNIT/ML Solostar Pen   insulin aspart (NOVOLOG FLEXPEN) 100 UNIT/ML FlexPen   Other Relevant Orders   HgB A1c (Completed)     Other   Tobacco abuse    Scott Baxter continues to smoke. Discussed risks of continued smoking and counseled importance of stopping to prevent complications in the future. Not ready to quit at this time.       HIV disease (HCC)    Scott Baxter continues to have well controlled virus with good adherence and tolerance to [employer].  Reviewed lab work and discussed plan of care, U equals U, and family planning. Check lab work. Continue current dose of Biktarvy. Ensure financial assistance renewed. Plan for follow up in  3 months or sooner if needed with lab work on the same day.       Relevant Medications   bictegravir-emtricitabine-tenofovir AF (BIKTARVY) 50-200-25 MG TABS tablet   Other Relevant Orders   T-helper cell (CD4)- ([facility] clinic only)   HIV-1 RNA quant-no reflex-bld   Healthcare maintenance    Discussed importance of safe sexual practice and condom use. Condoms and STD testing offered.  Influenza vaccination updated.  Reminded to schedule routine dental care.       Other Visit Diagnoses     Screening examination for venereal disease    -  Primary   Relevant Orders   Cytology (oral, anal, urethral) ancillary only   Cytology (oral, anal, urethral) ancillary only   Urine cytology ancillary only   RPR   Encounter for immunization       Relevant Orders   Flu vaccine trivalent PF, 6mos and older(Flulaval,Afluria,Fluarix,Fluzone) (Completed)        I have changed Scott Baxter's insulin glargine. I am also having Scott Baxter start on metoCLOPramide  and amLODipine. Additionally, I am having Scott Baxter maintain Scott Baxter's albuterol, Lancets, Pen Needles, onetouch ultrasoft, lisinopril-hydrochlorothiazide,  methocarbamol, naproxen, Biktarvy, and NovoLOG FlexPen.   Meds ordered this encounter  Medications   insulin glargine (LANTUS) 100 UNIT/ML Solostar Pen    Sig: Inject 34 Units into the skin daily.    Dispense:  15 mL    Refill:  11    Will pick up today; [facility]MAP    Order Specific Question:   Supervising Provider    Answer:   Judyann Munson [[id number]]   bictegravir-emtricitabine-tenofovir AF (BIKTARVY) 50-200-25 MG TABS tablet    Sig: Take 1 tablet by mouth daily.    Dispense:  30 tablet    Refill:  5    Order Specific Question:   Supervising Provider    Answer:   Drue Second, CYNTHIA [[id number]]   insulin aspart (NOVOLOG FLEXPEN) 100 UNIT/ML FlexPen    Sig: INJECT 15 UNITS UNDER THE SKIN THREE TIMES DAILY    Dispense:  15 mL    Refill:  5    Order Specific Question:   Supervising Provider    Answer:   Drue Second, CYNTHIA [[id number]]   metoCLOPramide (REGLAN) 10 MG tablet    Sig: Take 0.5-1 tablets (5-10 mg total) by mouth every 8 (eight) hours as needed for nausea.    Dispense:  30 tablet    Refill:  3    Order Specific Question:   Supervising Provider    Answer:   Drue Second, CYNTHIA [[id number]]   amLODipine (NORVASC) 10 MG tablet    Sig: Take 1 tablet (10 mg total) by mouth daily.    Dispense:  30 tablet    Refill:  11    Order Specific Question:   Supervising Provider    Answer:   Judyann Munson [[id number]]     Follow-up: Return in about 3 months (around 05/11/2023). or sooner if needed.    Marcos Eke, MSN, FNP-C Nurse Practitioner [facility] Main number: [phone]

## 2023-02-09 ENCOUNTER — Encounter: Payer: Self-pay | Admitting: Family

## 2023-02-09 DIAGNOSIS — Z23 Encounter for immunization: Secondary | ICD-10-CM

## 2023-02-09 LAB — T-HELPER CELL (CD4) - (RCID CLINIC ONLY)
CD4 % Helper T Cell: 40 % (ref 33–65)
CD4 T Cell Abs: 732 /uL (ref 400–1790)

## 2023-02-09 NOTE — Assessment & Plan Note (Signed)
[place] continues to smoke. Discussed risks of continued smoking and counseled importance of stopping to prevent complications in the future. Not ready to quit at this time.

## 2023-02-09 NOTE — Assessment & Plan Note (Signed)
Blood pressure poorly controlled with current regimen. No adverse side effects or hypotensive blood pressure readings. No neurological/ophthalmologic signs/symptoms. Continue current dose of lisinopril-hydrochlorothiazide and add amlodipine. Monitor blood pressure at home as able. Encouraged tobacco cessation.

## 2023-02-09 NOTE — Assessment & Plan Note (Signed)
Discussed importance of safe sexual practice and condom use. Condoms and STD testing offered.  Influenza vaccination updated.  Reminded to schedule routine dental care.

## 2023-02-09 NOTE — Assessment & Plan Note (Signed)
Improved glucose readings at home just above 200. Will increase Lantus and continue current dose of Novolog. Check A1c. Working to establish with Internal Medicine/Endocrinology.

## 2023-02-09 NOTE — Assessment & Plan Note (Addendum)
Ramond continues to have well controlled virus with good adherence and tolerance to [employer].  Reviewed lab work and discussed plan of care, U equals U, and family planning. Check lab work. Continue current dose of Biktarvy. Ensure financial assistance renewed. Plan for follow up in  3 months or sooner if needed with lab work on the same day.

## 2023-02-09 NOTE — Assessment & Plan Note (Signed)
Having increased symptoms periodically. Will restart Reglan with adjustments per Internal Medicine/Endocrinology as needed.

## 2023-02-10 LAB — HIV-1 RNA QUANT-NO REFLEX-BLD
HIV 1 RNA Quant: <20 {copies}/mL — ABNORMAL HIGH
HIV-1 RNA Quant, Log: <1.3 {Log} — ABNORMAL HIGH

## 2023-02-10 LAB — HEMOGLOBIN A1C
Hgb A1c MFr Bld: 10.4 %{Hb} — ABNORMAL HIGH (ref <5.7)
Mean Plasma Glucose: 252 mg/dL
eAG (mmol/L): 13.9 mmol/L

## 2023-02-10 LAB — RPR: RPR Ser Ql: NONREACTIVE

## 2023-02-14 ENCOUNTER — Telehealth: Payer: Self-pay

## 2023-02-14 LAB — URINE CYTOLOGY ANCILLARY ONLY
Chlamydia: NEGATIVE
Comment: NEGATIVE
Comment: NORMAL
Neisseria Gonorrhea: NEGATIVE

## 2023-02-14 LAB — CYTOLOGY, (ORAL, ANAL, URETHRAL) ANCILLARY ONLY
Chlamydia: NEGATIVE
Chlamydia: NEGATIVE
Comment: NEGATIVE
Comment: NEGATIVE
Comment: NORMAL
Comment: NORMAL
Neisseria Gonorrhea: NEGATIVE
Neisseria Gonorrhea: POSITIVE — AB

## 2023-02-14 NOTE — Telephone Encounter (Signed)
-----   Message from Jeanine Luz sent at 02/14/2023  3:45 PM EDT ----- Please schedule Scott Baxter for 500 mg Ceftriaxone IM once for gonorrhea treatment. Thanks!

## 2023-02-14 NOTE — Telephone Encounter (Signed)
Called Oscar to relay results and schedule treatment, no answer. Left HIPAA compliant voicemail requesting callback.   Sandie Ano, RN

## 2023-02-17 ENCOUNTER — Other Ambulatory Visit: Payer: Self-pay

## 2023-02-17 ENCOUNTER — Ambulatory Visit (INDEPENDENT_AMBULATORY_CARE_PROVIDER_SITE_OTHER): Payer: Self-pay

## 2023-02-17 DIAGNOSIS — A549 Gonococcal infection, unspecified: Secondary | ICD-10-CM

## 2023-02-17 MED ORDER — CEFTRIAXONE SODIUM 500 MG IJ SOLR
500.0000 mg | Freq: Once | INTRAMUSCULAR | Status: AC
Start: 2023-02-17 — End: 2023-02-17
  Administered 2023-02-17: 500 mg via INTRAMUSCULAR

## 2023-02-17 NOTE — Progress Notes (Signed)
Nurse Visit  Scott Baxter 02/29/1996   No Known Allergies   Reviewed allergies with patient.   Medications administered: ceftriaxone 500 mg IM   Immunizations administered: none  Patient tolerated well.   Advised patient no sex until treatment is completed plus an additional 7 days and instructed to notify sexual partners for testing and treatment. Patient verbalized understanding and has no further questions.   Sandie Ano, RN

## 2023-02-21 ENCOUNTER — Inpatient Hospital Stay (INDEPENDENT_AMBULATORY_CARE_PROVIDER_SITE_OTHER): Payer: Medicaid Other | Admitting: Primary Care

## 2023-03-01 ENCOUNTER — Inpatient Hospital Stay (INDEPENDENT_AMBULATORY_CARE_PROVIDER_SITE_OTHER): Payer: Medicaid Other | Admitting: Primary Care

## 2023-03-21 ENCOUNTER — Inpatient Hospital Stay (INDEPENDENT_AMBULATORY_CARE_PROVIDER_SITE_OTHER): Payer: Medicaid Other | Admitting: Primary Care

## 2023-03-21 ENCOUNTER — Encounter (INDEPENDENT_AMBULATORY_CARE_PROVIDER_SITE_OTHER): Payer: Self-pay

## 2023-05-14 ENCOUNTER — Encounter (HOSPITAL_COMMUNITY): Payer: Self-pay

## 2023-05-14 ENCOUNTER — Ambulatory Visit (HOSPITAL_COMMUNITY)
Admission: EM | Admit: 2023-05-14 | Discharge: 2023-05-14 | Disposition: A | Discharge status: Home / Self Care | Payer: Medicaid Other

## 2023-05-14 DIAGNOSIS — J069 Acute upper respiratory infection, unspecified: Secondary | ICD-10-CM

## 2023-05-14 NOTE — ED Provider Notes (Signed)
 [facility]    CSN: [id number] Arrival date & time: 05/14/23  1018      History   Chief Complaint Chief Complaint  Patient presents with   Cough    HPI Scott Baxter is a 28 y.o. adult.   Pt complains of a cough and congestion.  Pt reports he has had symptoms for a week.  Pt reports his employer is requiring him to have a work note.  Pt denies any fever.  No cough   The history is provided by the patient. No language interpreter was used.  Cough Cough characteristics:  Non-productive Sputum characteristics:  Nondescript Severity:  Moderate Onset quality:  Gradual Timing:  Constant Progression:  Worsening Chronicity:  New Relieved by:  Nothing Worsened by:  Nothing Ineffective treatments:  None tried Associated symptoms: sinus congestion   Associated symptoms: no fever     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   Healthcare maintenance 05/14/2019   Screening for STDs (sexually transmitted diseases) 11/14/2018   Depression 02/24/2017   Eczema 02/24/2017   Chlamydia infection 10/29/2016   Herpes labialis 11/26/2015   HIV disease (HCC) 09/03/2014   Gastroparesis 08/30/2014   Diabetic neuropathy (HCC) 08/30/2014   Asthma 08/30/2014   Tobacco abuse 08/17/2014   Type 1 diabetes mellitus with hyperglycemia (HCC) 09/01/2010   Goiter 09/01/2010   Hypertension 09/01/2010    Past Surgical History:  Procedure  Laterality Date   EXCISIONAL HEMORRHOIDECTOMY     FOOT SURGERY  1999   ingrown toenail removal       Home Medications    Prior to Admission medications   Medication Sig Start Date End Date Taking? Authorizing Provider  albuterol  (PROVENTIL  HFA;VENTOLIN  HFA) 108 (90 Base) MCG/ACT inhaler Inhale 2 puffs into the lungs every 4 (four) hours as needed for wheezing or shortness of breath. 05/11/15   Pisciotta, Nat, PA-C  amLODipine  (NORVASC ) 10 MG tablet Take 1 tablet (10 mg total) by mouth daily. 02/08/23   Calone, Gregory D, FNP  bictegravir-emtricitabine-tenofovir AF (BIKTARVY ) 50-200-25 MG TABS tablet Take 1 tablet by mouth daily. 02/08/23   Calone, Gregory D, FNP  insulin  aspart (NOVOLOG  FLEXPEN) 100 UNIT/ML FlexPen INJECT 15 UNITS UNDER THE SKIN THREE TIMES DAILY 02/08/23   Calone, Gregory D, FNP  insulin  glargine (LANTUS ) 100 UNIT/ML Solostar Pen Inject 34 Units into the skin daily. 02/08/23   Calone, Gregory D, FNP  Insulin  Pen Needle (PEN NEEDLES) 31G X 5 MM MISC 1 Units by Does not apply route 4 (four) times daily -  before meals and at bedtime. 12/25/20   Tanda Bleacher, MD  Lancets [facility] ULTRASOFT) lancets Use as instructed 12/25/20   Tanda Bleacher, MD  Lancets MISC 1 Units by Does not apply route 4 (four) times daily -  before meals and at bedtime. 04/10/20   Agyei, Obed K, MD  lisinopril -hydrochlorothiazide  (  ZESTORETIC ) 20-25 MG tablet Take 1 tablet by mouth daily. 01/25/23   Philemon Cordella BIRCH, FNP    Family History Family History  Problem Relation Age of Onset   Drug abuse Mother    Heart disease Mother    Alcohol abuse Father    Diabetes Paternal Aunt    Arthritis Paternal Grandmother    Stroke Paternal Grandfather    Diabetes Paternal Grandfather     Social History Social History   Tobacco Use   Smoking status: Some Days    Current packs/day: 0.50    Types: Cigarettes   Smokeless tobacco: Never   Tobacco comments:    Has cut back to 2 cigarettes a day.   Vaping  Use   Vaping status: Never Used  Substance Use Topics   Alcohol use: Not Currently    Comment: socially   Drug use: Yes    Frequency: 3.0 times per week    Types: Marijuana     Allergies   Patient has no known allergies.   Review of Systems Review of Systems  Constitutional:  Negative for fever.  Respiratory:  Positive for cough.   All other systems reviewed and are negative.    Physical Exam Triage Vital Signs ED Triage Vitals  Encounter Vitals Group     BP 05/14/23 1140 (!) 147/105     Systolic BP Percentile --      Diastolic BP Percentile --      Pulse Rate 05/14/23 1140 87     Resp 05/14/23 1140 16     Temp 05/14/23 1140 98.4 F (36.9 C)     Temp Source 05/14/23 1140 Oral     SpO2 05/14/23 1140 97 %     Weight 05/14/23 1139 234 lb (106.1 kg)     Height 05/14/23 1139 5' 10 (1.778 m)     Head Circumference --      Peak Flow --      Pain Score 05/14/23 1139 0     Pain Loc --      Pain Education --      Exclude from Growth Chart --    No data found.  Updated Vital Signs BP (!) 147/105 (BP Location: Left Arm)   Pulse 87   Temp 98.4 F (36.9 C) (Oral)   Resp 16   Ht 5' 10 (1.778 m)   Wt 106.1 kg   SpO2 97%   BMI 33.58 kg/m   Visual Acuity Right Eye Distance:   Left Eye Distance:   Bilateral Distance:    Right Eye Near:   Left Eye Near:    Bilateral Near:     Physical Exam Vitals and nursing note reviewed.  Constitutional:      Appearance: Scott Baxter is well-developed.  HENT:     Head: Normocephalic.     Right Ear: Tympanic membrane normal.     Left Ear: Tympanic membrane normal.     Nose: Nose normal.     Mouth/Throat:     Mouth: Mucous membranes are moist.  Cardiovascular:     Rate and Rhythm: Normal rate.  Pulmonary:     Effort: Pulmonary effort is normal.  Abdominal:     General: There is no distension.  Musculoskeletal:        General: Normal range of motion.     Cervical back: Normal range of motion.  Skin:    General: Skin is  warm.  Neurological:     General: No focal deficit present.  Mental Status: Scott Baxter is alert and oriented to person, place, and time.  Psychiatric:        Mood and Affect: Mood normal.      UC Treatments / Results  Labs (all labs ordered are listed, but only abnormal results are displayed) Labs Reviewed - No data to display  EKG   Radiology No results found.  Procedures Procedures (including critical care time)  Medications Ordered in UC Medications - No data to display  Initial Impression / Assessment and Plan / UC Course  I have reviewed the triage vital signs and the nursing notes.  Pertinent labs & imaging results that were available during my care of the patient were reviewed by me and considered in my medical decision making (see chart for details).     MDM: Patient is afebrile he is not short of breath.  Patient has a past medical history of HIV but been undetectable for 6 years.  Suspect patient has had a viral illness.  Patient is advised to follow-up with his physician for recheck.  Patient is given a note for his employer. Final Clinical Impressions(s) / UC Diagnoses   Final diagnoses:  Viral URI with cough   Discharge Instructions   None    ED Prescriptions   None    PDMP not reviewed this encounter. An After Visit Summary was printed and given to the patient.    Flint Sonny POUR, PA-C 05/14/23 1210

## 2023-05-14 NOTE — ED Triage Notes (Signed)
 Patient here today with c/o nasal drainage, productive cough, ST, fatigue, nausea, sweats, and chills X 1 week. He has been taking Theraflu with no relief and Sudafed with some relief. His dad has also been sick.

## 2023-06-06 ENCOUNTER — Encounter: Payer: Self-pay | Admitting: Family

## 2023-06-06 ENCOUNTER — Other Ambulatory Visit: Payer: Self-pay

## 2023-06-06 ENCOUNTER — Ambulatory Visit (INDEPENDENT_AMBULATORY_CARE_PROVIDER_SITE_OTHER): Payer: Self-pay | Admitting: Family

## 2023-06-06 VITALS — BP 138/90 | HR 71 | Temp 98.0°F | Ht 69.0 in | Wt 232.0 lb

## 2023-06-06 DIAGNOSIS — Z113 Encounter for screening for infections with a predominantly sexual mode of transmission: Secondary | ICD-10-CM

## 2023-06-06 DIAGNOSIS — F17210 Nicotine dependence, cigarettes, uncomplicated: Secondary | ICD-10-CM

## 2023-06-06 DIAGNOSIS — Z609 Problem related to social environment, unspecified: Secondary | ICD-10-CM

## 2023-06-06 DIAGNOSIS — E1065 Type 1 diabetes mellitus with hyperglycemia: Secondary | ICD-10-CM | POA: Diagnosis not present

## 2023-06-06 DIAGNOSIS — B20 Human immunodeficiency virus [HIV] disease: Secondary | ICD-10-CM

## 2023-06-06 DIAGNOSIS — I10 Essential (primary) hypertension: Secondary | ICD-10-CM

## 2023-06-06 DIAGNOSIS — Z720 Tobacco use: Secondary | ICD-10-CM

## 2023-06-06 DIAGNOSIS — Z0000 Encounter for general adult medical examination without abnormal findings: Secondary | ICD-10-CM

## 2023-06-06 MED ORDER — BIKTARVY 50-200-25 MG PO TABS: 1.0000 | ORAL_TABLET | Freq: Every day | ORAL | 6 refills | Status: DC

## 2023-06-06 NOTE — Patient Instructions (Addendum)
Nice to see you.  We will check your lab work today.  Continue to take your medication daily as prescribed.  Refills have been sent to the pharmacy.  Plan for follow up in 4 months or sooner if needed with lab work on the same day.  Have a great day and stay safe!

## 2023-06-06 NOTE — Assessment & Plan Note (Signed)
Scott Baxter is working on tobacco cessation and continues to cut back use. Encouraged continued progression and assistance available with patches, gums, or medications.

## 2023-06-06 NOTE — Progress Notes (Signed)
Brief Narrative   Patient ID: Scott Baxter, adult    DOB: November 30, 1995, 28 y.o.   MRN: [id number]  Scott Baxter is a 28 y/o AA adult diagnosed with HIV disease in April 2016 with risk factor of MSM. Initial CD4 nadir of 270 with viral load of 4.2 million.and suspected acute infection. Genotype with no significant resistance patterns. Entered care in CDC  Stage 2. [id number] negative. No history of opportunistic infection. Previous ART history with Genvoya and now Biktarvy   Subjective:    Chief Complaint  Patient presents with   Follow-up    HPI:  Scott Baxter is a 28 y.o. adult with HIV disease last seen on 02/08/2023 with well-controlled virus and good adherence and tolerance to [employer].  Viral load was undetectable with CD4 count 732.  Blood pressure noted to be poorly controlled and continued lisinopril-hydrochlorothiazide with addition of amlodipine.  Tobacco cessation was encouraged.  Diabetes followed by endocrinology.  Here today for routine follow-up.  Scott Baxter has been doing well since last office visit and continues to take [place] as prescribed with no adverse side effects or problems obtaining medication from the pharmacy.  Subsequently stopped taking lisinopril-hydrochlorothiazide and is only taking amlodipine.  No adverse side effects from amlodipine.  Has continued to take insulin as prescribed with fasting blood sugars slightly greater than 200.  Will be obtaining insurance shortly and should be able to follow-up with endocrinology for management of diabetes.  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  Reconnected with his sister and working on obtaining housing.  Cutting back on tobacco use.  Condoms and site-specific STD testing offered.  Vaccinations reviewed.  Denies fevers, chills, night sweats, headaches, changes in vision, neck pain/stiffness, nausea, diarrhea, vomiting, lesions or rashes.  Lab Results  Component Value Date   CD4TCELL  40 02/08/2023   CD4TABS 732 02/08/2023   Lab Results  Component Value Date   HIV1RNAQUANT <20 (H) 02/08/2023     No Known Allergies    Outpatient Medications Prior to Visit  Medication Sig Dispense Refill   albuterol (PROVENTIL HFA;VENTOLIN HFA) 108 (90 Base) MCG/ACT inhaler Inhale 2 puffs into the lungs every 4 (four) hours as needed for wheezing or shortness of breath. 1 Inhaler 0   amLODipine (NORVASC) 10 MG tablet Take 1 tablet (10 mg total) by mouth daily. 30 tablet 11   insulin aspart (NOVOLOG FLEXPEN) 100 UNIT/ML FlexPen INJECT 15 UNITS UNDER THE SKIN THREE TIMES DAILY 15 mL 5   insulin glargine (LANTUS) 100 UNIT/ML Solostar Pen Inject 34 Units into the skin daily. 15 mL 11   Insulin Pen Needle (PEN NEEDLES) 31G X 5 MM MISC 1 Units by Does not apply route 4 (four) times daily -  before meals and at bedtime. 100 each 11   Lancets (ONETOUCH ULTRASOFT) lancets Use as instructed 100 each 12   Lancets MISC 1 Units by Does not apply route 4 (four) times daily -  before meals and at bedtime. 200 each 11   bictegravir-emtricitabine-tenofovir AF (BIKTARVY) 50-200-25 MG TABS tablet Take 1 tablet by mouth daily. 30 tablet 5   lisinopril-hydrochlorothiazide (ZESTORETIC) 20-25 MG tablet Take 1 tablet by mouth daily. (Patient not taking: Reported on 06/06/2023) 30 tablet 5   No facility-administered medications prior to visit.     Past Medical History:  Diagnosis Date   Asthma    Depression 02/24/2017   Diabetes mellitus 2010   HCAP (healthcare-associated pneumonia) 08/31/2014   HIV  disease (HCC) 09/03/2014   Intrauterine drug exposure    IUGR (intrauterine growth restriction)    Obesity    Proctitis    Proctitis    Recurrent boils    MRSA and MSSA   Vision abnormalities      Past Surgical History:  Procedure Laterality Date   EXCISIONAL HEMORRHOIDECTOMY     FOOT SURGERY  1999   ingrown toenail removal      Review of Systems  Constitutional:  Negative for appetite  change, chills, diaphoresis, fatigue, fever and unexpected weight change.  Eyes:        Negative for acute change in vision  Respiratory:  Negative for chest tightness, shortness of breath and wheezing.   Cardiovascular:  Negative for chest pain.  Gastrointestinal:  Negative for diarrhea, nausea and vomiting.  Genitourinary:  Negative for dysuria, pelvic pain and vaginal discharge.  Musculoskeletal:  Negative for neck pain and neck stiffness.  Skin:  Negative for rash.  Neurological:  Negative for seizures, syncope, weakness and headaches.  Hematological:  Negative for adenopathy. Does not bruise/bleed easily.  Psychiatric/Behavioral:  Negative for hallucinations.       Objective:    BP (!) 138/90   Pulse 71   Temp 98 F (36.7 C) (Temporal)   Ht 5\' 9"  (1.753 m)   Wt 232 lb (105.2 kg)   SpO2 99%   BMI 34.26 kg/m  Nursing note and vital signs reviewed.  BP Readings from Last 3 Encounters:  06/06/23 (!) 138/90  05/14/23 (!) 147/105  02/08/23 (!) 155/96    Physical Exam Constitutional:      General: Scott Baxter is not in acute distress.    Appearance: Scott Baxter is well-developed.  Eyes:     Conjunctiva/sclera: Conjunctivae normal.  Cardiovascular:     Rate and Rhythm: Normal rate and regular rhythm.     Heart sounds: Normal heart sounds. No murmur heard.    No friction rub. No gallop.  Pulmonary:     Effort: Pulmonary effort is normal. No respiratory distress.     Breath sounds: Normal breath sounds. No wheezing or rales.  Chest:     Chest wall: No tenderness.  Abdominal:     General: Bowel sounds are normal.     Palpations: Abdomen is soft.     Tenderness: There is no abdominal tenderness.  Musculoskeletal:     Cervical back: Neck supple.  Lymphadenopathy:     Cervical: No cervical adenopathy.  Skin:    General: Skin is warm and dry.     Findings: No rash.  Neurological:     Mental Status: Scott Baxter is alert and oriented to person, place, and time.  Psychiatric:         Behavior: Behavior normal.        Thought Content: Thought content normal.        Judgment: Judgment normal.         06/06/2023   10:46 AM 02/08/2023    3:49 PM 08/27/2022    8:28 AM 04/16/2022   10:20 AM 12/09/2021   11:11 AM  Depression screen PHQ 2/9  Decreased Interest 0 0 0 0 0  Down, Depressed, Hopeless 0 0 0 1 2  PHQ - 2 Score 0 0 0 1 2  Altered sleeping     1  Tired, decreased energy     3  Change in appetite     2  Feeling bad or failure about yourself      1  Trouble concentrating  3  Moving slowly or fidgety/restless     2  Suicidal thoughts     1  PHQ-9 Score     15       Assessment & Plan:    Patient Active Problem List   Diagnosis Date Noted   Poor social situation 12/09/2021   Abnormal CT scan 01/15/2021   Abscess of pubic region 04/15/2020   Subungual hematoma of great toe 04/10/2020   Positive RPR test 04/10/2020   Healthcare maintenance 05/14/2019   Screening for STDs (sexually transmitted diseases) 11/14/2018   Depression 02/24/2017   Eczema 02/24/2017   Chlamydia infection 10/29/2016   Herpes labialis 11/26/2015   HIV disease (HCC) 09/03/2014   Gastroparesis 08/30/2014   Diabetic neuropathy (HCC) 08/30/2014   Asthma 08/30/2014   Tobacco abuse 08/17/2014   Type 1 diabetes mellitus with hyperglycemia (HCC) 09/01/2010   Goiter 09/01/2010   Hypertension 09/01/2010     Problem List Items Addressed This Visit       Cardiovascular and Mediastinum   Hypertension   Blood pressure improved with amlodipine despite stopping lisinopril-hydrochlorothiazide. No adverse side effects. Encouraged to monitor blood pressure at home as able and continue with low sodium diet. Working on tobacco cessation which will help as well. Continue current dose of amlodipine.       Relevant Orders   COMPLETE METABOLIC PANEL WITH GFR   HIV-1 RNA quant-no reflex-bld   T-helper cell (CD4)- ([facility] clinic only)     Endocrine   Type 1 diabetes mellitus with  hyperglycemia (HCC)   Type 1 diabetes with fasting blood sugars around 200. Will increase lantus 2 units every 2-3 days to titrate blood sugars with goal of 150 or until he is seen by Endocrinology. Continue current dose of Novolog. Due for diabetic eye exam, foot exam and urinary screen.         Other   Tobacco abuse   Nicolai is working on tobacco cessation and continues to cut back use. Encouraged continued progression and assistance available with patches, gums, or medications.       HIV disease (HCC) - Primary   Jahquez continues to have well-controlled virus with good adherence and tolerance to [employer].  Reviewed previous lab work and discussed plan of care and U equals U.  Check blood work.  Continue current dose of Biktarvy.  Advised to follow-up once insurance is in place for possible need of co-pay card.  Plan for follow-up in 4 months or sooner if needed with lab work on the same day.      Relevant Medications   bictegravir-emtricitabine-tenofovir AF (BIKTARVY) 50-200-25 MG TABS tablet   Screening for STDs (sexually transmitted diseases)   Relevant Orders   RPR   Urine cytology ancillary only   Cytology (oral, anal, urethral) ancillary only   Cytology (oral, anal, urethral) ancillary only   Healthcare maintenance   Discussed importance of safe sexual practice and condom use. Condoms and site specific STD testing offered.  Vaccinations reviewed and declined.  Due for routine dental care which will be scheduled independently.        Poor social situation   Social situation has vastly improved with stable housing and access to food. Now working two jobs and looking to purchase a home.         I have discontinued Scherrie Merritts. Mattie's lisinopril-hydrochlorothiazide. I am also having Akia maintain Terryn's albuterol, Lancets, Pen Needles, onetouch ultrasoft, insulin glargine, NovoLOG FlexPen, amLODipine, and Biktarvy.   Meds ordered this  encounter  Medications    bictegravir-emtricitabine-tenofovir AF (BIKTARVY) 50-200-25 MG TABS tablet    Sig: Take 1 tablet by mouth daily.    Dispense:  30 tablet    Refill:  6    Supervising Provider:   Judyann Munson [phone]    Prescription Type::   Renewal     Follow-up: Return in about 4 months (around 10/04/2023). or sooner if needed.    Marcos Eke, MSN, FNP-C Nurse Practitioner [facility]ious Disease [facility] Main number: [phone]

## 2023-06-06 NOTE — Assessment & Plan Note (Signed)
Discussed importance of safe sexual practice and condom use. Condoms and site specific STD testing offered.  Vaccinations reviewed and declined.  Due for routine dental care which will be scheduled independently.

## 2023-06-06 NOTE — Assessment & Plan Note (Signed)
Scott Baxter continues to have well-controlled virus with good adherence and tolerance to [employer].  Reviewed previous lab work and discussed plan of care and U equals U.  Check blood work.  Continue current dose of Biktarvy.  Advised to follow-up once insurance is in place for possible need of co-pay card.  Plan for follow-up in 4 months or sooner if needed with lab work on the same day.

## 2023-06-06 NOTE — Assessment & Plan Note (Signed)
Blood pressure improved with amlodipine despite stopping lisinopril-hydrochlorothiazide. No adverse side effects. Encouraged to monitor blood pressure at home as able and continue with low sodium diet. Working on tobacco cessation which will help as well. Continue current dose of amlodipine.

## 2023-06-06 NOTE — Assessment & Plan Note (Signed)
Social situation has vastly improved with stable housing and access to food. Now working two jobs and looking to purchase a home.

## 2023-06-06 NOTE — Assessment & Plan Note (Signed)
Type 1 diabetes with fasting blood sugars around 200. Will increase lantus 2 units every 2-3 days to titrate blood sugars with goal of 150 or until he is seen by Endocrinology. Continue current dose of Novolog. Due for diabetic eye exam, foot exam and urinary screen.

## 2023-06-07 LAB — T-HELPER CELL (CD4) - (RCID CLINIC ONLY)
CD4 % Helper T Cell: 41 % (ref 33–65)
CD4 T Cell Abs: 647 /uL (ref 400–1790)

## 2023-06-07 LAB — CYTOLOGY, (ORAL, ANAL, URETHRAL) ANCILLARY ONLY
Chlamydia: NEGATIVE
Chlamydia: POSITIVE — AB
Comment: NEGATIVE
Comment: NEGATIVE
Comment: NORMAL
Comment: NORMAL
Neisseria Gonorrhea: NEGATIVE
Neisseria Gonorrhea: NEGATIVE

## 2023-06-07 LAB — URINE CYTOLOGY ANCILLARY ONLY
Chlamydia: NEGATIVE
Comment: NEGATIVE
Comment: NORMAL
Neisseria Gonorrhea: NEGATIVE

## 2023-06-08 DIAGNOSIS — E1065 Type 1 diabetes mellitus with hyperglycemia: Secondary | ICD-10-CM

## 2023-06-08 LAB — COMPLETE METABOLIC PANEL WITH GFR
AG Ratio: 1.1 (calc) (ref 1.0–2.5)
ALT: 25 U/L (ref 9–46)
AST: 20 U/L (ref 10–40)
Albumin: 4.2 g/dL (ref 3.6–5.1)
Alkaline phosphatase (APISO): 70 U/L (ref 36–130)
BUN: 13 mg/dL (ref 7–25)
CO2: 25 mmol/L (ref 20–32)
Calcium: 9.6 mg/dL (ref 8.6–10.3)
Chloride: 104 mmol/L (ref 98–110)
Creat: 1.12 mg/dL (ref 0.60–1.24)
Globulin: 3.9 g/dL — ABNORMAL HIGH (ref 1.9–3.7)
Glucose, Bld: 284 mg/dL — ABNORMAL HIGH (ref 65–99)
Potassium: 4.2 mmol/L (ref 3.5–5.3)
Sodium: 137 mmol/L (ref 135–146)
Total Bilirubin: 0.4 mg/dL (ref 0.2–1.2)
Total Protein: 8.1 g/dL (ref 6.1–8.1)
eGFR: 92 mL/min/{1.73_m2} (ref >=60)

## 2023-06-08 LAB — RPR TITER: RPR Titer: 1:1 {titer} — ABNORMAL HIGH

## 2023-06-08 LAB — T PALLIDUM AB: T Pallidum Abs: POSITIVE — AB

## 2023-06-08 LAB — RPR: RPR Ser Ql: REACTIVE — AB

## 2023-06-08 LAB — HIV-1 RNA QUANT-NO REFLEX-BLD
HIV 1 RNA Quant: 690 {copies}/mL — ABNORMAL HIGH
HIV-1 RNA Quant, Log: 2.84 {Log} — ABNORMAL HIGH

## 2023-06-08 MED ORDER — DOXYCYCLINE HYCLATE 100 MG PO TABS: 100.0000 mg | ORAL_TABLET | Freq: Two times a day (BID) | ORAL | 0 refills | Status: DC

## 2023-06-08 NOTE — Telephone Encounter (Signed)
Patient stated he received my chart message. Aware to pick up RX for Doxycycline 100 mg 1 tablet BID x 7 days. Patient aware to abstain from sexual intercourse now and 7 to 10 days after completing tx. Also to notify any sexual partners that they need to be tested and treated  as well.   Gretta Samons Lesli Albee, CMA

## 2023-06-08 NOTE — Addendum Note (Signed)
Addended by: Jeanine Luz D on: 06/08/2023 11:20 AM   Modules accepted: Orders

## 2023-06-08 NOTE — Telephone Encounter (Signed)
-----   Message from Jeanine Luz sent at 06/08/2023 11:19 AM EST ----- Please inform Scott Baxter that he was positive for chlamydia and confirm that he received the message to obtain doxycycline for treatment. Thanks!

## 2023-06-09 MED ORDER — BASAGLAR KWIKPEN 100 UNIT/ML ~~LOC~~ SOPN: 34.0000 [IU] | PEN_INJECTOR | Freq: Every day | SUBCUTANEOUS | 3 refills | Status: DC

## 2023-06-09 NOTE — Addendum Note (Signed)
Addended by: Jeanine Luz D on: 06/09/2023 08:35 AM   Modules accepted: Orders

## 2023-06-20 NOTE — Addendum Note (Signed)
 Addended by: Jacqualyn Sedgwick D on: 06/20/2023 05:03 PM   Modules accepted: Orders

## 2023-06-21 ENCOUNTER — Telehealth: Payer: Self-pay

## 2023-06-21 NOTE — Telephone Encounter (Signed)
PA for Novolog submitted via covermymeds. Awaiting response.   Key: Derrick Ravel, RN

## 2023-06-22 NOTE — Telephone Encounter (Signed)
PA approved. Approval faxed to pharmacy.   Sandie Ano, RN

## 2023-09-15 ENCOUNTER — Ambulatory Visit: Payer: Commercial Managed Care - HMO | Admitting: Endocrinology

## 2023-09-22 NOTE — Progress Notes (Deleted)
   HPI: Scott Baxter is a 28 y.o. adult who presents to the [facility] clinic today for STI testing.  Patient Active Problem List   Diagnosis Date Noted   Poor social situation 12/09/2021   Abnormal CT scan 01/15/2021   Abscess of pubic region 04/15/2020   Subungual hematoma of great toe 04/10/2020   Positive RPR test 04/10/2020   Healthcare maintenance 05/14/2019   Screening for STDs (sexually transmitted diseases) 11/14/2018   Depression 02/24/2017   Eczema 02/24/2017   Chlamydia infection 10/29/2016   Herpes labialis 11/26/2015   HIV disease (HCC) 09/03/2014   Gastroparesis 08/30/2014   Diabetic neuropathy (HCC) 08/30/2014   Asthma 08/30/2014   Tobacco abuse 08/17/2014   Type 1 diabetes mellitus with hyperglycemia (HCC) 09/01/2010   Goiter 09/01/2010   Hypertension 09/01/2010    Patient's Medications  New Prescriptions   No medications on file  Previous Medications   ALBUTEROL  (PROVENTIL  HFA;VENTOLIN  HFA) 108 (90 BASE) MCG/ACT INHALER    Inhale 2 puffs into the lungs every 4 (four) hours as needed for wheezing or shortness of breath.   AMLODIPINE  (NORVASC ) 10 MG TABLET    Take 1 tablet (10 mg total) by mouth daily.   BICTEGRAVIR-EMTRICITABINE-TENOFOVIR AF (BIKTARVY ) 50-200-25 MG TABS TABLET    Take 1 tablet by mouth daily.   DOXYCYCLINE  (VIBRA -TABS) 100 MG TABLET    Take 1 tablet (100 mg total) by mouth 2 (two) times daily.   INSULIN  ASPART (NOVOLOG  FLEXPEN) 100 UNIT/ML FLEXPEN    INJECT 15 UNITS UNDER THE SKIN THREE TIMES DAILY   INSULIN  GLARGINE (BASAGLAR  KWIKPEN) 100 UNIT/ML    Inject 34 Units into the skin daily.   INSULIN  PEN NEEDLE (PEN NEEDLES) 31G X 5 MM MISC    1 Units by Does not apply route 4 (four) times daily -  before meals and at bedtime.   LANCETS (ONETOUCH ULTRASOFT) LANCETS    Use as instructed   LANCETS MISC    1 Units by Does not apply route 4 (four) times daily -  before meals and at bedtime.  Modified Medications   No medications on file  Discontinued  Medications   No medications on file    Assessment: Scott Baxter presents today for STI testing. Last saw Erla Haw on 06/06/23 for HIV currently treated with Biktarvy . HIV RNA was 690 and CD4 count was 647. Rectal cytology was positive for chlamydia and he was treated with doxycycline . Of note patient has a history of syphilis that was treated in 2021 and last RPR titer was stable in January.  STI screening HIV RNA?? Adherence to Biktarvy ? Discuss doxyPEP?  Appears eligible for Menveo booster, Shringrix, and HepB vaccine dose 2/2 today.  Plan: - STI screening: HIV antibody, RPR, urine/rectal/pharyngeal GC/CT swabs for cytology today - HIV RNA today *** - F/u results to see if treatment is needed - HIV follow up with Erla Haw on 10/17/23  Georga Killings, PharmD PGY-1 Pharmacy Resident

## 2023-09-23 ENCOUNTER — Ambulatory Visit: Admitting: Pharmacist

## 2023-09-23 DIAGNOSIS — Z113 Encounter for screening for infections with a predominantly sexual mode of transmission: Secondary | ICD-10-CM

## 2023-09-23 DIAGNOSIS — B20 Human immunodeficiency virus [HIV] disease: Secondary | ICD-10-CM

## 2023-10-17 ENCOUNTER — Ambulatory Visit: Payer: Medicaid Other | Admitting: Family

## 2023-10-25 ENCOUNTER — Ambulatory Visit

## 2023-11-07 ENCOUNTER — Other Ambulatory Visit: Payer: Self-pay

## 2023-11-07 ENCOUNTER — Emergency Department (HOSPITAL_COMMUNITY)

## 2023-11-07 ENCOUNTER — Emergency Department (HOSPITAL_COMMUNITY)
Admission: EM | Admit: 2023-11-07 | Discharge: 2023-11-07 | Disposition: A | Discharge status: Home / Self Care | Attending: Emergency Medicine | Admitting: Emergency Medicine

## 2023-11-07 ENCOUNTER — Encounter (HOSPITAL_COMMUNITY): Payer: Self-pay

## 2023-11-07 DIAGNOSIS — E1065 Type 1 diabetes mellitus with hyperglycemia: Secondary | ICD-10-CM

## 2023-11-07 DIAGNOSIS — E871 Hypo-osmolality and hyponatremia: Secondary | ICD-10-CM | POA: Diagnosis not present

## 2023-11-07 DIAGNOSIS — Z21 Asymptomatic human immunodeficiency virus [HIV] infection status: Secondary | ICD-10-CM | POA: Diagnosis not present

## 2023-11-07 DIAGNOSIS — D72829 Elevated white blood cell count, unspecified: Secondary | ICD-10-CM | POA: Insufficient documentation

## 2023-11-07 DIAGNOSIS — R059 Cough, unspecified: Secondary | ICD-10-CM | POA: Diagnosis present

## 2023-11-07 DIAGNOSIS — J111 Influenza due to unidentified influenza virus with other respiratory manifestations: Secondary | ICD-10-CM | POA: Diagnosis not present

## 2023-11-07 DIAGNOSIS — Z794 Long term (current) use of insulin: Secondary | ICD-10-CM | POA: Diagnosis not present

## 2023-11-07 LAB — CBC
HCT: 43 % (ref 39.0–52.0)
Hemoglobin: 14.4 g/dL (ref 13.0–17.0)
MCH: 28.7 pg (ref 26.0–34.0)
MCHC: 33.5 g/dL (ref 30.0–36.0)
MCV: 85.7 fL (ref 80.0–100.0)
Platelets: 209 10*3/uL (ref 150–400)
RBC: 5.02 MIL/uL (ref 4.22–5.81)
RDW: 14 % (ref 11.5–15.5)
WBC: 11.8 10*3/uL — ABNORMAL HIGH (ref 4.0–10.5)
nRBC: 0 % (ref 0.0–0.2)

## 2023-11-07 LAB — BASIC METABOLIC PANEL WITH GFR
Anion gap: 10 (ref 5–15)
BUN: 9 mg/dL (ref 6–20)
CO2: 20 mmol/L — ABNORMAL LOW (ref 22–32)
Calcium: 9 mg/dL (ref 8.9–10.3)
Chloride: 104 mmol/L (ref 98–111)
Creatinine, Ser: 1.49 mg/dL — ABNORMAL HIGH (ref 0.61–1.24)
GFR, Estimated: >60 mL/min (ref >60)
Glucose, Bld: 238 mg/dL — ABNORMAL HIGH (ref 70–99)
Potassium: 3.7 mmol/L (ref 3.5–5.1)
Sodium: 134 mmol/L — ABNORMAL LOW (ref 135–145)

## 2023-11-07 LAB — CBG MONITORING, ED: Glucose-Capillary: 241 mg/dL — ABNORMAL HIGH (ref 70–99)

## 2023-11-07 LAB — TROPONIN I (HIGH SENSITIVITY)
Troponin I (High Sensitivity): 3 ng/L (ref <18)
Troponin I (High Sensitivity): 3 ng/L (ref <18)

## 2023-11-07 MED ORDER — IPRATROPIUM BROMIDE HFA 17 MCG/ACT IN AERS
2.0000 | INHALATION_SPRAY | Freq: Four times a day (QID) | RESPIRATORY_TRACT | Status: DC
  Administered 2023-11-07: 2 via RESPIRATORY_TRACT
  Filled 2023-11-07: qty 12.9

## 2023-11-07 MED ORDER — NOVOLOG FLEXPEN 100 UNIT/ML ~~LOC~~ SOPN: PEN_INJECTOR | SUBCUTANEOUS | 5 refills | Status: DC

## 2023-11-07 NOTE — ED Triage Notes (Signed)
 Pt reports living out of car at the moment

## 2023-11-07 NOTE — ED Provider Notes (Signed)
 [facility] EMERGENCY DEPARTMENT AT [facility] Provider Note   CSN: [id number] Arrival date & time: 11/07/23  9480     Patient presents with: Chest Pain   LIBAN GUEDES is a 28 y.o. adult.   Patient with 3-day history of flulike symptoms.  Patient had some chest pressure cough feels like maybe he is wheezing cough has been productive.  Associated with some chills body aches no nausea vomiting or diarrhea.  Patient's past medical history is significant that he is followed by infectious disease for HIV.  Patient is on antivirals.  Patient normally has an inhaler but he is out of it.       Prior to Admission medications   Medication Sig Start Date End Date Taking? Authorizing Provider  albuterol  (PROVENTIL  HFA;VENTOLIN  HFA) 108 (90 Base) MCG/ACT inhaler Inhale 2 puffs into the lungs every 4 (four) hours as needed for wheezing or shortness of breath. 05/11/15   Pisciotta, Nat, PA-C  amLODipine  (NORVASC ) 10 MG tablet Take 1 tablet (10 mg total) by mouth daily. 02/08/23   Calone, Gregory D, FNP  bictegravir-emtricitabine-tenofovir AF (BIKTARVY ) 50-200-25 MG TABS tablet Take 1 tablet by mouth daily. 06/06/23   Calone, Gregory D, FNP  doxycycline  (VIBRA -TABS) 100 MG tablet Take 1 tablet (100 mg total) by mouth 2 (two) times daily. 06/08/23   Calone, Gregory D, FNP  insulin  aspart (NOVOLOG  FLEXPEN) 100 UNIT/ML FlexPen INJECT 15 UNITS UNDER THE SKIN THREE TIMES DAILY 02/08/23   Calone, Gregory D, FNP  Insulin  Glargine (BASAGLAR  KWIKPEN) 100 UNIT/ML Inject 34 Units into the skin daily. 06/09/23   Calone, Gregory D, FNP  Insulin  Pen Needle (PEN NEEDLES) 31G X 5 MM MISC 1 Units by Does not apply route 4 (four) times daily -  before meals and at bedtime. 12/25/20   Tanda Bleacher, MD  Lancets [facility] ULTRASOFT) lancets Use as instructed 12/25/20   Tanda Bleacher, MD  Lancets MISC 1 Units by Does not apply route 4 (four) times daily -  before meals and at bedtime. 04/10/20   Agyei, Obed K, MD     Allergies: Patient has no known allergies.    Review of Systems  Constitutional:  Positive for chills. Negative for fever.  HENT:  Positive for congestion. Negative for ear pain and sore throat.   Eyes:  Negative for pain and visual disturbance.  Respiratory:  Positive for cough. Negative for shortness of breath.   Cardiovascular:  Positive for chest pain. Negative for palpitations.  Gastrointestinal:  Negative for abdominal pain and vomiting.  Genitourinary:  Negative for dysuria and hematuria.  Musculoskeletal:  Positive for myalgias. Negative for arthralgias and back pain.  Skin:  Negative for color change and rash.  Neurological:  Negative for seizures and syncope.  All other systems reviewed and are negative.   Updated Vital Signs BP (!) 137/93   Pulse (!) 101   Temp 98.3 F (36.8 C)   Resp 17   Ht 1.753 m (5' 9)   Wt 99.8 kg   SpO2 99%   BMI 32.49 kg/m   Physical Exam Vitals and nursing note reviewed.  Constitutional:      General: Vernor is not in acute distress.    Appearance: Normal appearance. Markevion is well-developed.  HENT:     Head: Normocephalic and atraumatic.     Mouth/Throat:     Mouth: Mucous membranes are moist.   Eyes:     Extraocular Movements: Extraocular movements intact.     Conjunctiva/sclera: Conjunctivae normal.  Pupils: Pupils are equal, round, and reactive to light.    Cardiovascular:     Rate and Rhythm: Normal rate and regular rhythm.     Heart sounds: No murmur heard. Pulmonary:     Effort: Pulmonary effort is normal. No respiratory distress.     Breath sounds: Wheezing present.  Abdominal:     Palpations: Abdomen is soft.     Tenderness: There is no abdominal tenderness.   Musculoskeletal:        General: No swelling.     Cervical back: Neck supple.   Skin:    General: Skin is warm and dry.     Capillary Refill: Capillary refill takes less than 2 seconds.   Neurological:     General: No focal deficit present.      Mental Status: Naeem is alert and oriented to person, place, and time.   Psychiatric:        Mood and Affect: Mood normal.     (all labs ordered are listed, but only abnormal results are displayed) Labs Reviewed  BASIC METABOLIC PANEL WITH GFR - Abnormal; Notable for the following components:      Result Value   Sodium 134 (*)    CO2 20 (*)    Glucose, Bld 238 (*)    Creatinine, Ser 1.49 (*)    All other components within normal limits  CBC - Abnormal; Notable for the following components:   WBC 11.8 (*)    All other components within normal limits  CBG MONITORING, ED - Abnormal; Notable for the following components:   Glucose-Capillary 241 (*)    All other components within normal limits  TROPONIN I (HIGH SENSITIVITY)  TROPONIN I (HIGH SENSITIVITY)    EKG: EKG Interpretation Date/Time:  Monday November 07 2023 05:58:55 EDT Ventricular Rate:  101 PR Interval:  126 QRS Duration:  76 QT Interval:  314 QTC Calculation: 407 R Axis:   39  Text Interpretation: Sinus tachycardia Nonspecific T wave abnormality Abnormal ECG When compared with ECG of 14-Jan-2018 07:48, PREVIOUS ECG IS PRESENT No significant change since last tracing Confirmed by Callen Vancuren [phone]) on 11/07/2023 8:11:14 AM  Radiology: [facility] Chest 2 View Result Date: 11/07/2023 CLINICAL DATA:  Chest pain EXAM: CHEST - 2 VIEW COMPARISON:  01/14/2018 FINDINGS: The heart size and mediastinal contours are within normal limits. Both lungs are clear. The visualized skeletal structures are unremarkable. IMPRESSION: No active cardiopulmonary disease. Electronically Signed   By: Camellia Candle M.D.   On: 11/07/2023 06:18     Procedures   Medications Ordered in the ED  ipratropium (ATROVENT  HFA) inhaler 2 puff (has no administration in time range)                                    Medical Decision Making Amount and/or Complexity of Data Reviewed Labs: ordered. Radiology: ordered.  Risk Prescription drug  management.   Chest x-ray negative.  Patient metabolic panel blood sugar 238 GFR normal at greater than 60.  Creatinine up some at 1.49.  CBC white count 11.8 hemoglobin 14.4 platelets 209 troponin 3 does not need delta troponin.  EKG was sinus tachycardia otherwise no significant change from previous EKG  Final diagnoses:  Influenza-like illness    ED Discharge Orders     None          Geraldene Hamilton, MD 11/07/23 [phone]

## 2023-11-07 NOTE — Discharge Instructions (Addendum)
 Use the albuterol  inhaler 2 puffs every 6 hours.  Also would recommend Mucinex  DM to help suppress the cough and clear the phlegm.  Work note provided.  We have also renewed your NovoLog  prescription.

## 2023-11-07 NOTE — ED Triage Notes (Signed)
 Pt coming in from home reporting chest pressure with difficulty breathing. Pt reporting feeling feverish and having chills. Pt reporting body aches. Pt reports productive cough.

## 2023-11-10 ENCOUNTER — Other Ambulatory Visit: Payer: Self-pay

## 2023-11-10 DIAGNOSIS — Z79899 Other long term (current) drug therapy: Secondary | ICD-10-CM

## 2023-11-10 DIAGNOSIS — Z113 Encounter for screening for infections with a predominantly sexual mode of transmission: Secondary | ICD-10-CM

## 2023-11-10 DIAGNOSIS — B20 Human immunodeficiency virus [HIV] disease: Secondary | ICD-10-CM

## 2023-11-14 ENCOUNTER — Other Ambulatory Visit (HOSPITAL_COMMUNITY)
Admission: RE | Admit: 2023-11-14 | Discharge: 2023-11-14 | Disposition: A | Discharge status: Home / Self Care | Source: Ambulatory Visit | Attending: Family | Admitting: Family

## 2023-11-14 ENCOUNTER — Other Ambulatory Visit

## 2023-11-14 ENCOUNTER — Other Ambulatory Visit: Payer: Self-pay

## 2023-11-14 DIAGNOSIS — B20 Human immunodeficiency virus [HIV] disease: Secondary | ICD-10-CM

## 2023-11-14 DIAGNOSIS — Z113 Encounter for screening for infections with a predominantly sexual mode of transmission: Secondary | ICD-10-CM

## 2023-11-14 DIAGNOSIS — Z79899 Other long term (current) drug therapy: Secondary | ICD-10-CM

## 2023-11-14 NOTE — Addendum Note (Signed)
 Addended by: FLORENE BOUCHARD D on: 11/14/2023 10:24 AM   Modules accepted: Orders

## 2023-11-15 LAB — URINE CYTOLOGY ANCILLARY ONLY
Chlamydia: NEGATIVE
Comment: NEGATIVE
Comment: NORMAL
Neisseria Gonorrhea: NEGATIVE

## 2023-11-15 LAB — T-HELPER CELL (CD4) - (RCID CLINIC ONLY)
CD4 % Helper T Cell: 36 % (ref 33–65)
CD4 T Cell Abs: 576 /uL (ref 400–1790)

## 2023-11-16 LAB — LIPID PANEL
Cholesterol: 155 mg/dL (ref <200)
HDL: 41 mg/dL (ref >=40)
LDL Cholesterol (Calc): 98 mg/dL
Non-HDL Cholesterol (Calc): 114 mg/dL (ref <130)
Total CHOL/HDL Ratio: 3.8 (calc) (ref <5.0)
Triglycerides: 70 mg/dL (ref <150)

## 2023-11-16 LAB — CBC WITH DIFFERENTIAL/PLATELET
Absolute Lymphocytes: 1801 {cells}/uL (ref 850–3900)
Absolute Monocytes: 429 {cells}/uL (ref 200–950)
Basophils Absolute: 33 {cells}/uL (ref 0–200)
Basophils Relative: 0.5 %
Eosinophils Absolute: 39 {cells}/uL (ref 15–500)
Eosinophils Relative: 0.6 %
HCT: 43.4 % (ref 38.5–50.0)
Hemoglobin: 13.9 g/dL (ref 13.2–17.1)
MCH: 28.2 pg (ref 27.0–33.0)
MCHC: 32 g/dL (ref 32.0–36.0)
MCV: 88 fL (ref 80.0–100.0)
MPV: 11.7 fL (ref 7.5–12.5)
Monocytes Relative: 6.6 %
Neutro Abs: 4199 {cells}/uL (ref 1500–7800)
Neutrophils Relative %: 64.6 %
Platelets: 284 Thousand/uL (ref 140–400)
RBC: 4.93 Million/uL (ref 4.20–5.80)
RDW: 13.6 % (ref 11.0–15.0)
Total Lymphocyte: 27.7 %
WBC: 6.5 Thousand/uL (ref 3.8–10.8)

## 2023-11-16 LAB — COMPLETE METABOLIC PANEL WITHOUT GFR
AG Ratio: 1.1 (calc) (ref 1.0–2.5)
ALT: 12 U/L (ref 9–46)
AST: 15 U/L (ref 10–40)
Albumin: 3.8 g/dL (ref 3.6–5.1)
Alkaline phosphatase (APISO): 66 U/L (ref 36–130)
BUN/Creatinine Ratio: 10 (calc) (ref 6–22)
BUN: 14 mg/dL (ref 7–25)
CO2: 24 mmol/L (ref 20–32)
Calcium: 9 mg/dL (ref 8.6–10.3)
Chloride: 102 mmol/L (ref 98–110)
Creat: 1.34 mg/dL — ABNORMAL HIGH (ref 0.60–1.24)
Globulin: 3.4 g/dL (ref 1.9–3.7)
Glucose, Bld: 222 mg/dL — ABNORMAL HIGH (ref 65–99)
Potassium: 4.2 mmol/L (ref 3.5–5.3)
Sodium: 135 mmol/L (ref 135–146)
Total Bilirubin: 0.4 mg/dL (ref 0.2–1.2)
Total Protein: 7.2 g/dL (ref 6.1–8.1)

## 2023-11-16 LAB — RPR: RPR Ser Ql: NONREACTIVE

## 2023-11-16 LAB — HIV-1 RNA QUANT-NO REFLEX-BLD
HIV 1 RNA Quant: <20 {copies}/mL — AB
HIV-1 RNA Quant, Log: <1.3 {Log_copies}/mL — AB

## 2023-11-22 ENCOUNTER — Ambulatory Visit: Payer: Self-pay | Admitting: Family

## 2023-11-28 ENCOUNTER — Telehealth: Payer: Self-pay

## 2023-11-28 ENCOUNTER — Ambulatory Visit

## 2023-11-28 NOTE — Telephone Encounter (Signed)
 Patient called requesting to review lab results.   Reviewed that viral load undetectable and CD4 healthy at over 500. Syphilis and urine screening for gonorrhea and chlamydia negative. Discussed that blood sugar was elevated at 222 and creatinine elevated, but less than prior value in June.   Updated patient's phone number and email, he will call IT support to regain access to MyChart. Reminded him of upcoming appointment.   Liese Dizdarevic, BSN, RN

## 2023-12-20 ENCOUNTER — Other Ambulatory Visit (HOSPITAL_COMMUNITY): Payer: Self-pay

## 2023-12-20 ENCOUNTER — Ambulatory Visit: Admitting: Family

## 2023-12-20 ENCOUNTER — Encounter: Payer: Self-pay | Admitting: Family

## 2023-12-20 ENCOUNTER — Other Ambulatory Visit: Payer: Self-pay

## 2023-12-20 VITALS — BP 138/85 | HR 82 | Temp 97.6°F | Ht 70.0 in | Wt 210.0 lb

## 2023-12-20 DIAGNOSIS — Z794 Long term (current) use of insulin: Secondary | ICD-10-CM | POA: Diagnosis not present

## 2023-12-20 DIAGNOSIS — B20 Human immunodeficiency virus [HIV] disease: Secondary | ICD-10-CM

## 2023-12-20 DIAGNOSIS — I10 Essential (primary) hypertension: Secondary | ICD-10-CM | POA: Diagnosis not present

## 2023-12-20 DIAGNOSIS — E1065 Type 1 diabetes mellitus with hyperglycemia: Secondary | ICD-10-CM | POA: Diagnosis not present

## 2023-12-20 MED ORDER — BIKTARVY 50-200-25 MG PO TABS: 1.0000 | ORAL_TABLET | Freq: Every day | ORAL | 6 refills | Status: AC

## 2023-12-20 MED ORDER — INSULIN PEN NEEDLE 31G X 5 MM MISC: 2 refills | Status: AC

## 2023-12-20 MED ORDER — AMLODIPINE BESYLATE 10 MG PO TABS: 10.0000 mg | ORAL_TABLET | Freq: Every day | ORAL | 5 refills | Status: AC

## 2023-12-20 MED ORDER — LANTUS SOLOSTAR 100 UNIT/ML ~~LOC~~ SOPN: 34.0000 [IU] | PEN_INJECTOR | Freq: Every day | SUBCUTANEOUS | 2 refills | Status: DC

## 2023-12-20 MED ORDER — NOVOLOG FLEXPEN 100 UNIT/ML ~~LOC~~ SOPN: PEN_INJECTOR | SUBCUTANEOUS | 5 refills | Status: DC

## 2023-12-20 NOTE — Assessment & Plan Note (Signed)
 Scott Baxter continues to have poorly controlled diabetes and symptoms of fatigue, headaches and vertigo are likely related to poorly controlled diabetes. It is unclear how is taking the prescribed regimen as he has not refilled medication since December. Discussed importance of insulin  to maintain blood sugars and counseled on the different types of insulin . New referral placed to Endocrinology for assistance with insulin  dosing. For now will continue current dose of Lantus /Basaglar  at 34 units increasing 1-2 units every 2-3 days as needed until blood sugars are <200 up to a max dose of 47. Continue meal coverage insulin  at 15 units 3x daily and checking blood sugars fasting and before meals and at bedtime. Suspect improved insulin  and diabetes control will resolve majority of his current symptoms.

## 2023-12-20 NOTE — Assessment & Plan Note (Signed)
 Scott Baxter continues to have well controlled virus with good adherence and tolerance to Biktarvy . Reviewed recent lab work and discussed plan of care and U equals U. Met with financial counselor to determine status of Medicaid. No lab work today. Social determinants of health reviewed with no interventions indicated. Continue current dose of Biktarvy . Plan for follow up in 1 month or sooner if needed.

## 2023-12-20 NOTE — Progress Notes (Signed)
 Brief Narrative   Patient ID: Scott Baxter, adult    DOB: 09-28-1995, 28 y.o.   MRN: [id number]  Scott Baxter is a 28 y/o AA adult diagnosed with HIV disease in April 2016 with risk factor of MSM. Initial CD4 nadir of 270 with viral load of 4.2 million.and suspected acute infection. Genotype with no significant resistance patterns. Entered care in CDC  Stage 2. [id number] negative. No history of opportunistic infection. Previous ART history with Genvoya and now Biktarvy    Subjective:   Chief Complaint  Patient presents with   Follow-up    Fatigue, uncontrolled diabetes    HPI:  Scott Baxter is a 28 y.o. adult with HIV disease last seen on 06/06/2023 with less than adequately controlled virus and good tolerance to Biktarvy .  Viral load was 690 with CD4 count 647.  Found to have chlamydia status posttreatment with doxycycline .  Most recent lab work completed on 11/14/2023 with viral load that is undetectable and CD4 count 576.  RPR was nonreactive.  Urine was negative for gonorrhea and chlamydia.  Kidney function, liver function, electrolytes within normal ranges.  Glucose elevated at 222.  Here today for routine follow-up.  Scott Baxter has not been doing so well since his last office visit and is present today with his mother, Tommye. Continues to take Biktarvy  as prescribed with no adverse side effects. Has concerns increasing stress and difficulties especially related to his ability to work which is effecting his livelihood and increasing overall stress. Cannot stand for long periods of time; having increased vertigo, severe fatigue, and overall decreased endurance. Has been having issues with insurance and obtaining his insulin . Using a glucose meter that a family member gave him and has been having to use other insulins. Has Medicaid but may have other insurance from previous employer. Has not been able to get up with Endocrinology. Considering applying for SSDI. Diabetes has been poorly controlled  with morning sugars in the 300's and currently prescribed Basaglar  34 units daily and Novolog  15 units 3 times per day. Only eating about 1 meal per day right now. Continues to lose weight unintentionally.   Denies fevers, chills, night sweats, headaches, changes in vision, neck pain/stiffness, nausea, diarrhea, vomiting, lesions or rashes.  Lab Results  Component Value Date   CD4TCELL 36 11/14/2023   CD4TABS 576 11/14/2023   Lab Results  Component Value Date   HIV1RNAQUANT <20 DETECTED (A) 11/14/2023     No Known Allergies    Outpatient Medications Prior to Visit  Medication Sig Dispense Refill   albuterol  (PROVENTIL  HFA;VENTOLIN  HFA) 108 (90 Base) MCG/ACT inhaler Inhale 2 puffs into the lungs every 4 (four) hours as needed for wheezing or shortness of breath. 1 Inhaler 0   Lancets (ONETOUCH ULTRASOFT) lancets Use as instructed 100 each 12   Lancets MISC 1 Units by Does not apply route 4 (four) times daily -  before meals and at bedtime. 200 each 11   amLODipine  (NORVASC ) 10 MG tablet Take 1 tablet (10 mg total) by mouth daily. 30 tablet 11   bictegravir-emtricitabine-tenofovir AF (BIKTARVY ) 50-200-25 MG TABS tablet Take 1 tablet by mouth daily. 30 tablet 6   insulin  aspart (NOVOLOG  FLEXPEN) 100 UNIT/ML FlexPen INJECT 15 UNITS UNDER THE SKIN THREE TIMES DAILY 15 mL 5   Insulin  Glargine (BASAGLAR  KWIKPEN) 100 UNIT/ML Inject 34 Units into the skin daily. 10.2 mL 3   Insulin  Pen Needle (PEN NEEDLES) 31G X 5 MM MISC 1 Units by Does not apply  route 4 (four) times daily -  before meals and at bedtime. 100 each 11   doxycycline  (VIBRA -TABS) 100 MG tablet Take 1 tablet (100 mg total) by mouth 2 (two) times daily. (Patient not taking: Reported on 12/20/2023) 14 tablet 0   No facility-administered medications prior to visit.     Past Medical History:  Diagnosis Date   Asthma    Depression 02/24/2017   Diabetes mellitus 2010   HCAP (healthcare-associated pneumonia) 08/31/2014   HIV  disease (HCC) 09/03/2014   Intrauterine drug exposure    IUGR (intrauterine growth restriction)    Obesity    Proctitis    Proctitis    Recurrent boils    MRSA and MSSA   Vision abnormalities      Past Surgical History:  Procedure Laterality Date   EXCISIONAL HEMORRHOIDECTOMY     FOOT SURGERY  1999   ingrown toenail removal        Review of Systems  Constitutional:  Positive for appetite change, fatigue and unexpected weight change. Negative for chills, diaphoresis and fever.  Eyes:        Negative for acute change in vision  Respiratory:  Negative for chest tightness, shortness of breath and wheezing.   Cardiovascular:  Negative for chest pain.  Gastrointestinal:  Negative for diarrhea, nausea and vomiting.  Genitourinary:  Negative for dysuria, pelvic pain and vaginal discharge.  Musculoskeletal:  Negative for neck pain and neck stiffness.  Skin:  Negative for rash.  Neurological:  Positive for headaches. Negative for seizures, syncope and weakness.  Hematological:  Negative for adenopathy. Does not bruise/bleed easily.  Psychiatric/Behavioral:  Positive for dysphoric mood. Negative for hallucinations.      Objective:   BP 138/85   Pulse 82   Temp 97.6 F (36.4 C) (Temporal)   Ht 5' 10 (1.778 m)   Wt 210 lb (95.3 kg)   SpO2 98%   BMI 30.13 kg/m  Nursing note and vital signs reviewed.  Physical Exam Constitutional:      General: Scott Baxter is not in acute distress.    Appearance: Scott Baxter is well-developed.  Eyes:     Conjunctiva/sclera: Conjunctivae normal.  Cardiovascular:     Rate and Rhythm: Normal rate and regular rhythm.     Heart sounds: Normal heart sounds. No murmur heard.    No friction rub. No gallop.  Pulmonary:     Effort: Pulmonary effort is normal. No respiratory distress.     Breath sounds: Normal breath sounds. No wheezing or rales.  Chest:     Chest wall: No tenderness.  Abdominal:     General: Bowel sounds are normal.      Palpations: Abdomen is soft.     Tenderness: There is no abdominal tenderness.  Musculoskeletal:     Cervical back: Neck supple.  Lymphadenopathy:     Cervical: No cervical adenopathy.  Skin:    General: Skin is warm and dry.     Findings: No rash.  Neurological:     Mental Status: Scott Baxter is alert and oriented to person, place, and time.  Psychiatric:        Behavior: Behavior normal.        Thought Content: Thought content normal.        Judgment: Judgment normal.          12/20/2023    3:19 PM 06/06/2023   10:46 AM 02/08/2023    3:49 PM 08/27/2022    8:28 AM 04/16/2022   10:20 AM  Depression  screen PHQ 2/9  Decreased Interest 3 0 0 0 0  Down, Depressed, Hopeless 2 0 0 0 1  PHQ - 2 Score 5 0 0 0 1  Altered sleeping 1      Tired, decreased energy 3      Change in appetite 3      Feeling bad or failure about yourself  1      Trouble concentrating 1      Moving slowly or fidgety/restless 3      Suicidal thoughts 0      PHQ-9 Score 17      Difficult doing work/chores Extremely dIfficult            12/20/2023    3:19 PM 12/24/2020   12:15 PM  [name] 7 : Generalized Anxiety Score  Nervous, Anxious, on Edge 1 3  Control/stop worrying 3 3  Worry too much - different things 3 3  Trouble relaxing 3 3  Restless 1 1  Easily annoyed or irritable 1 3  Afraid - awful might happen 3 3  Total [name] 7 Score 15 19  Anxiety Difficulty Extremely difficult      The ASCVD Risk score (Arnett DK, et al., 2019) failed to calculate for the following reasons:   The 2019 ASCVD risk score is only valid for ages 58 to 32      Assessment & Plan:    Patient Active Problem List   Diagnosis Date Noted   Poor social situation 12/09/2021   Abnormal CT scan 01/15/2021   Abscess of pubic region 04/15/2020   Subungual hematoma of great toe 04/10/2020   Positive RPR test 04/10/2020   Healthcare maintenance 05/14/2019   Screening for STDs (sexually transmitted diseases) 11/14/2018   Depression  02/24/2017   Eczema 02/24/2017   Chlamydia infection 10/29/2016   Herpes labialis 11/26/2015   HIV disease (HCC) 09/03/2014   Gastroparesis 08/30/2014   Diabetic neuropathy (HCC) 08/30/2014   Asthma 08/30/2014   Tobacco abuse 08/17/2014   Type 1 diabetes mellitus with hyperglycemia (HCC) 09/01/2010   Goiter 09/01/2010   Hypertension 09/01/2010     Problem List Items Addressed This Visit       Cardiovascular and Mediastinum   Hypertension   Blood pressure adequately controlled with current dose of amlodipine . Does have headaches but likely related to poorly controlled diabetes with adequately controlled blood pressure. Continue current dose of amlodipine .       Relevant Medications   amLODipine  (NORVASC ) 10 MG tablet     Endocrine   Type 1 diabetes mellitus with hyperglycemia [facility])   Scott Baxter continues to have poorly controlled diabetes and symptoms of fatigue, headaches and vertigo are likely related to poorly controlled diabetes. It is unclear how is taking the prescribed regimen as he has not refilled medication since December. Discussed importance of insulin  to maintain blood sugars and counseled on the different types of insulin . New referral placed to Endocrinology for assistance with insulin  dosing. For now will continue current dose of Lantus /Basaglar  at 34 units increasing 1-2 units every 2-3 days as needed until blood sugars are <200 up to a max dose of 47. Continue meal coverage insulin  at 15 units 3x daily and checking blood sugars fasting and before meals and at bedtime. Suspect improved insulin  and diabetes control will resolve majority of his current symptoms.       Relevant Medications   insulin  aspart (NOVOLOG  FLEXPEN) 100 UNIT/ML FlexPen   insulin  glargine (LANTUS  SOLOSTAR) 100 UNIT/ML Solostar Pen  Other Relevant Orders   Ambulatory referral to Endocrinology     Other   HIV disease (HCC) - Primary   Scott Baxter continues to have well controlled virus with good  adherence and tolerance to Biktarvy . Reviewed recent lab work and discussed plan of care and U equals U. Met with financial counselor to determine status of Medicaid. No lab work today. Social determinants of health reviewed with no interventions indicated. Continue current dose of Biktarvy . Plan for follow up in 1 month or sooner if needed.       Relevant Medications   bictegravir-emtricitabine-tenofovir AF (BIKTARVY ) 50-200-25 MG TABS tablet     I have discontinued Scott Baxter Pen Needles and Basaglar  KwikPen. I am also having Scott Baxter start on Lantus  SoloStar and Insulin  Pen Needle. Additionally, I am having Scott Baxter maintain Scott Baxter's albuterol , Lancets, onetouch ultrasoft, doxycycline , NovoLOG  FlexPen, Biktarvy , and amLODipine .   Meds ordered this encounter  Medications   insulin  aspart (NOVOLOG  FLEXPEN) 100 UNIT/ML FlexPen    Sig: INJECT 15 UNITS UNDER THE SKIN THREE TIMES DAILY    Dispense:  15 mL    Refill:  5    Supervising Provider:   LUIZ Baxter [[id number]]   insulin  glargine (LANTUS  SOLOSTAR) 100 UNIT/ML Solostar Pen    Sig: Inject 34 Units into the skin daily.    Dispense:  15 mL    Refill:  2    Supervising Provider:   SNIDER, CYNTHIA [[id number]]   bictegravir-emtricitabine-tenofovir AF (BIKTARVY ) 50-200-25 MG TABS tablet    Sig: Take 1 tablet by mouth daily.    Dispense:  30 tablet    Refill:  6    Supervising Provider:   SNIDER, CYNTHIA [phone]    Prescription Type::   Renewal   amLODipine  (NORVASC ) 10 MG tablet    Sig: Take 1 tablet (10 mg total) by mouth daily.    Dispense:  30 tablet    Refill:  5    Supervising Provider:   SNIDER, CYNTHIA [[id number]]   Insulin  Pen Needle 31G X 5 MM MISC    Sig: Use 1 pen needle per injection. Do not reuse.    Dispense:  100 each    Refill:  2    Supervising Provider:   LUIZ Baxter [[id number]]     Follow-up: Return in about 1 month (around 01/20/2024). or sooner if needed.    Cathlyn July, MSN, FNP-C Nurse  Practitioner [facility] for Infectious Disease [facility] Main number: [phone]

## 2023-12-20 NOTE — Patient Instructions (Addendum)
 Nice to see you.  Continue to take your medication daily as prescribed.  Refills have been sent to the pharmacy.  Plan for follow up in 1 months or sooner if needed with lab work on the same day.  Have a great day and stay safe!   Smoking Cessation: QuitlineNC 1-800-QUIT-NOW [phone]); Espaol: 1-855-Djelo-Ya (1-(713) 804-4731) [email or web address]

## 2023-12-20 NOTE — Assessment & Plan Note (Signed)
 Blood pressure adequately controlled with current dose of amlodipine . Does have headaches but likely related to poorly controlled diabetes with adequately controlled blood pressure. Continue current dose of amlodipine .

## 2024-03-16 ENCOUNTER — Other Ambulatory Visit: Payer: Self-pay | Admitting: Family

## 2024-03-16 DIAGNOSIS — E1065 Type 1 diabetes mellitus with hyperglycemia: Secondary | ICD-10-CM

## 2024-03-20 ENCOUNTER — Telehealth: Payer: Self-pay

## 2024-03-20 ENCOUNTER — Other Ambulatory Visit (HOSPITAL_COMMUNITY): Payer: Self-pay

## 2024-03-20 NOTE — Telephone Encounter (Signed)
 Pharmacy Patient Advocate Encounter   Received notification from [employer] that prior authorization for NOVOLOG  is required/requested.   Insurance verification completed.   The patient is insured through [employer] [facility] [facility] MEDICAID.   Per test claim: PA required; PA submitted to above mentioned insurance via Lat[employer] Key/confirmation #/EOC [id number] Status is pending

## 2024-03-21 NOTE — Telephone Encounter (Addendum)
 Pharmacy Patient Advocate Encounter  Received notification from [employer] that Prior Authorization for  has been CANCELLED DUE TO IT NOT BEING NEEDED PATIENT MUST FILL GENERIC

## 2024-05-21 ENCOUNTER — Ambulatory Visit: Admitting: Endocrinology

## 2024-05-21 ENCOUNTER — Ambulatory Visit: Payer: Self-pay | Admitting: Endocrinology

## 2024-05-21 ENCOUNTER — Encounter: Payer: Self-pay | Admitting: Endocrinology

## 2024-05-21 VITALS — BP 164/86 | HR 84 | Resp 18 | Ht 70.0 in | Wt 208.0 lb

## 2024-05-21 DIAGNOSIS — E1069 Type 1 diabetes mellitus with other specified complication: Secondary | ICD-10-CM

## 2024-05-21 LAB — POCT GLYCOSYLATED HEMOGLOBIN (HGB A1C): Hemoglobin A1C: 13 % — AB (ref 4.0–5.6)

## 2024-05-21 MED ORDER — NOVOLOG FLEXPEN 100 UNIT/ML ~~LOC~~ SOPN: PEN_INJECTOR | SUBCUTANEOUS | 5 refills | Status: AC

## 2024-05-21 MED ORDER — FREESTYLE LIBRE 3 PLUS SENSOR MISC: 1.0000 | 3 refills | Status: AC

## 2024-05-21 MED ORDER — LANTUS SOLOSTAR 100 UNIT/ML ~~LOC~~ SOPN: 25.0000 [IU] | PEN_INJECTOR | Freq: Every day | SUBCUTANEOUS | 2 refills | Status: AC

## 2024-05-21 NOTE — Progress Notes (Signed)
 "  Outpatient Endocrinology Note Scott Vogelgesang, MD   Patient's Name: Scott Baxter    DOB: August 29, 1995    MRN: [id number]                                                    REASON OF VISIT: New consult for type 1 diabetes mellitus  REFERRING PROVIDER: Philemon Cordella BIRCH, FNP   PCP: Pcp, No  HISTORY OF PRESENT ILLNESS:   Scott Baxter is a 29 y.o. old adult with past medical history listed below, is here for new consult / follow up for type 1 diabetes mellitus.   Pertinent Diabetes History: Patient is referred to endocrinology for further evaluation and management of uncontrolled type 1 diabetes mellitus.  Initial consult on May 21, 2024.  Patient was diagnosed with type 1 diabetes mellitus in 2010, has been on multidose insulin  regimen.  Patient has uncontrolled type 1 diabetes mellitus with mostly hemoglobin A1c in the range of 10 to 14%.  History of DKA or diabetes related hospitalizations: yes  Previous diabetes education: Yes ?   Latest Reference Range & Units 04/14/09 17:24 04/18/09 17:52  Insulin  Antibodies, Human <0.4 U/mL  0.1  Glutamic Acid Decarb Ab <=1.0 U/mL  >30.0 (H)  Pancreatic Islet Cell Antibody <5 JDF Units  >80 !  C-Peptide 0.80 - 3.90 ng/mL 0.32 (L)   (H): Data is abnormally high !: Data is abnormal (L): Data is abnormally low  # Patient has HIV, on treatment, following with infectious disease.  Chronic Diabetes Complications : Retinopathy: unknown. Last ophthalmology exam was done on ?, following with ophthalmology regularly.  Nephropathy: no Peripheral neuropathy: no Coronary artery disease: no Stroke: no  Relevant comorbidities and cardiovascular risk factors: Obesity: no Body mass index is 29.84 kg/m.  Hypertension: no Hyperlipidemia : no  Current / Home Diabetic regimen includes:  Lantus  15 units at bedtime, two times a day. Novolog  12 units with 1-4 times a day.   Prior diabetic medications:  Glycemic data:   Glucometer data  review, checking blood sugar occasionally some of the blood sugar 223, 413, 449.  Hypoglycemia: Patient has denied hypoglycemic episodes. Patient has hypoglycemia awareness.  Factors modifying glucose control: 1.  Diabetic diet assessment: Usually 1-2 times a day.  Eating soda regular.   2.  Staying active or exercising:   3.  Medication compliance: compliant most of the time.  Interval history  Initial visit today.  Hemoglobin A1c worsening 13%.  Diabetes regimen is reviewed and noted above, not fully compliant.  Reports occasional hypoglycemic symptoms.  Has been checking blood sugars with glucometer occasionally.  No other complaints today.  REVIEW OF SYSTEMS As per history of present illness.   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)    IUGR (intrauterine growth restriction)    Obesity    Proctitis    Proctitis    Recurrent boils    MRSA and MSSA   Vision abnormalities     PAST SURGICAL HISTORY: Past Surgical History:  Procedure Laterality Date   EXCISIONAL HEMORRHOIDECTOMY     FOOT SURGERY  1999   ingrown toenail removal  ALLERGIES: Allergies[1]  FAMILY HISTORY:  Family History  Problem Relation Age of Onset   Drug abuse Mother    Heart disease Mother    Alcohol abuse Father    Diabetes Paternal Aunt    Arthritis Paternal Grandmother    Stroke Paternal Grandfather    Diabetes Paternal Grandfather     SOCIAL HISTORY: Social History   Socioeconomic History   Marital status: Single    Spouse name: Not on file   Number of children: Not on file   Years of education: Not on file   Highest education level: Not on file  Occupational History   Not on file  Tobacco Use   Smoking status: Some Days    Current packs/day: 0.50    Types: Cigarettes   Smokeless  tobacco: Never  Vaping Use   Vaping status: Never Used  Substance and Sexual Activity   Alcohol use: Not Currently   Drug use: Yes    Frequency: 3.0 times per week    Types: Marijuana   Sexual activity: Yes    Partners: Male  Other Topics Concern   Not on file  Social History Narrative   Grandmother has custody. 12th grade at [employer]. Accepted to [employer] for the fall. Wants to major in Nursing.    Social Drivers of Health   Tobacco Use: High Risk (05/21/2024)   Patient History    Smoking Tobacco Use: Some Days    Smokeless Tobacco Use: Never    Passive Exposure: Not on file  Financial Resource Strain: Not on file  Food Insecurity: Not on file  Transportation Needs: Not on file  Physical Activity: Not on file  Stress: Not on file  Social Connections: Not on file  Depression (PHQ2-9): High Risk (12/20/2023)   Depression (PHQ2-9)    PHQ-2 Score: 17  Alcohol Screen: Not on file  Housing: Not on file  Utilities: Not on file  Health Literacy: Not on file    MEDICATIONS:  Current Outpatient Medications  Medication Sig Dispense Refill   albuterol  (PROVENTIL  HFA;VENTOLIN  HFA) 108 (90 Base) MCG/ACT inhaler Inhale 2 puffs into the lungs every 4 (four) hours as needed for wheezing or shortness of breath. 1 Inhaler 0   amLODipine  (NORVASC ) 10 MG tablet Take 1 tablet (10 mg total) by mouth daily. 30 tablet 5   azithromycin  (ZITHROMAX ) 250 MG tablet Take 250 mg by mouth as directed.     bictegravir-emtricitabine-tenofovir AF (BIKTARVY ) 50-200-25 MG TABS tablet Take 1 tablet by mouth daily. 30 tablet 6   Continuous Glucose Sensor (FREESTYLE LIBRE 3 PLUS SENSOR) MISC 1 each by Does not apply route continuous. Change every 15 days. 6 each 3   Insulin  Pen Needle 31G X 5 MM MISC Use 1 pen needle per injection. Do not reuse. 100 each 2   Lancets (ONETOUCH ULTRASOFT) lancets Use as instructed 100 each 12   Lancets MISC 1 Units by Does not apply route 4 (four) times daily -  before  meals and at bedtime. 200 each 11   insulin  aspart (NOVOLOG  FLEXPEN) 100 UNIT/ML FlexPen INJECT 5-10 UNITS plus sliding scale UNDER THE SKIN THREE TIMES DAILY, maximum 40 units/day. 15 mL 5   insulin  glargine (LANTUS  SOLOSTAR) 100 UNIT/ML Solostar Pen Inject 25 Units into the skin daily. 15 mL 2   methylPREDNISolone  (MEDROL  DOSEPAK) 4 MG TBPK tablet Take by mouth as directed. (Patient not taking: Reported on 05/21/2024)     No current facility-administered medications for this visit.  PHYSICAL EXAM: Vitals:   05/21/24 1003 05/21/24 1007  BP: (!) 160/80 (!) 164/86  Pulse: 84   Resp: 18   SpO2: 99%   Weight: 208 lb (94.3 kg)   Height: 5' 10 (1.778 m)    Body mass index is 29.84 kg/m.  Wt Readings from Last 3 Encounters:  05/21/24 208 lb (94.3 kg)  12/20/23 210 lb (95.3 kg)  11/07/23 220 lb (99.8 kg)    General: Well developed, well nourished adult in no apparent distress.  HEENT: AT/[place], no external lesions.  Eyes: Conjunctiva clear and no icterus. Neck: Neck supple  Lungs: Respirations not labored Neurologic: Alert, oriented, normal speech Extremities / Skin: Dry.  Psychiatric: Does not appear depressed or anxious  Diabetic Foot Exam - Simple   No data filed     LABS Reviewed Lab Results  Component Value Date   HGBA1C 13.0 (A) 05/21/2024   HGBA1C 10.4 (H) 02/08/2023   HGBA1C 12.4 (H) 04/16/2022   No results found for: FRUCTOSAMINE Lab Results  Component Value Date   CHOL 155 11/14/2023   HDL 41 11/14/2023   LDLCALC 98 11/14/2023   TRIG 70 11/14/2023   CHOLHDL 3.8 11/14/2023   Lab Results  Component Value Date   MICRALBCREAT 88 (H) 12/09/2021   MICRALBCREAT 7.0 07/22/2012   Lab Results  Component Value Date   CREATININE 1.34 (H) 11/14/2023   No results found for: GFR  ASSESSMENT / PLAN  1. Type 1 diabetes mellitus with other specified complication (HCC)     Diabetes Mellitus type 1, complicated by microalbuminuria/hyperglycemia. - Diabetic  status / severity: Uncontrolled, worsening.  Lab Results  Component Value Date   HGBA1C 13.0 (A) 05/21/2024    - Hemoglobin A1c goal : <6.5%  Patient was diagnosed with type 1 diabetes mellitus in 2010, has remained uncontrolled for several years.  Not fully compliant with insulin  regimen.  No glucose data to review.  Discussed about potential chronic diabetic complications including diabetic retinopathy, neuropathy and nephropathy.  Discussed about compliance with insulin  regimen with all the meals.    Adjusted insulin  regimen as follows.  - Medications: See below  I) stay on Lantus  25 units at bedtime.  Patient is currently taking 15 units 2 times a day for some of the time not taking second dose due to concern of hypoglycemia. II) adjust NovoLog  5 to 10 units plus sliding scale up to 3 times a day.  Advised to take NovoLog  before eating.  Currently taking after eating.  Sliding Scale Blood Glucose        Insulin  60-150                     None 151-200                   None 201-250                   2 units 251-300                   4 units 301-350                   6 units 351-400                   8 units      >400                        9 units and  call provider   Will address chronic diabetic complications after reasonable improvement of diabetes control.  - Home glucose testing: Sent prescription for freestyle libre 3+ CGM and check blood sugar as needed before meals and at bedtime ask to bring glucometer in the follow-up visit.  - Discussed/ Gave Hypoglycemia treatment plan.  # Consult : not required at this time.   # Annual urine for microalbuminuria/ creatinine ratio, + microalbuminuria currently.  Will continue to monitor, expect to improve with improving diabetes control. Last  Lab Results  Component Value Date   MICRALBCREAT 88 (H) 12/09/2021    # Foot check nightly.  # Annual dilated diabetic eye exams.   - Diet: Make healthy diabetic food choices,  advised to avoid regular sodas. - Life style / activity / exercise: Discussed.  2. Blood pressure  -  BP Readings from Last 1 Encounters:  05/21/24 (!) 164/86    - Control is not in target. Asymptomatic.  Monitor at home and follow-up with primary care provider.  3. Lipid status / Hyperlipidemia - Last  Lab Results  Component Value Date   LDLCALC 98 11/14/2023   - No indication of statin at this time.  Jereld was seen today for establish care.  Diagnoses and all orders for this visit:  Type 1 diabetes mellitus with other specified complication (HCC) -     POCT glycosylated hemoglobin (Hb A1C) -     Continuous Glucose Sensor (FREESTYLE LIBRE 3 PLUS SENSOR) MISC; 1 each by Does not apply route continuous. Change every 15 days. -     insulin  glargine (LANTUS  SOLOSTAR) 100 UNIT/ML Solostar Pen; Inject 25 Units into the skin daily. -     insulin  aspart (NOVOLOG  FLEXPEN) 100 UNIT/ML FlexPen; INJECT 5-10 UNITS plus sliding scale UNDER THE SKIN THREE TIMES DAILY, maximum 40 units/day.    DISPOSITION Follow up in clinic in 6 weeks  suggested.   All questions answered and patient verbalized understanding of the plan.  Shavanna Furnari, MD [facility] [address] [place], [address] [place], [place] [address] Phone # [phone]  At least part of this note was generated using voice recognition software. Inadvertent word errors may have occurred, which were not recognized during the proofreading process.     [1] No Known Allergies  "

## 2024-05-21 NOTE — Patient Instructions (Signed)
 Diabetes regimen:  Lantus  25 units at bedtime.  Novolog  5-10 units plus sliding scale 3 times a day. Before eating.   Sliding Scale Blood Glucose        Insulin  60-150                     None 151-200                   None 201-250                   2 units 251-300                   4 units 301-350                   6 units 351-400                   8 units      >400                        9 units and call provider   Libre CGM

## 2024-06-04 ENCOUNTER — Telehealth: Payer: Self-pay | Admitting: Pharmacy Technician

## 2024-06-04 ENCOUNTER — Other Ambulatory Visit (HOSPITAL_COMMUNITY): Payer: Self-pay

## 2024-06-04 NOTE — Telephone Encounter (Signed)
 Pharmacy Patient Advocate Encounter   Received notification from [facility] KEY that prior authorization for FreeStyle Libre 3 Plus Sensor  is required/requested.   Insurance verification completed.   The patient is insured through [facility] MEDICAID.   Per test claim: PA required; PA submitted to above mentioned insurance via Latent Key/confirmation #/EOC [id number] Status is pending

## 2024-06-04 NOTE — Telephone Encounter (Signed)
 Pharmacy Patient Advocate Encounter  Received notification from [facility] MEDICAID that Prior Authorization for FreeStyle Libre 3 Plus Sensor  has been APPROVED from 06/04/24 to 12/02/24. Ran test claim, Copay is $0.00. This test claim was processed through [facility]- copay amounts may vary at other pharmacies due to pharmacy/plan contracts, or as the patient moves through the different stages of their insurance plan.   PA #/Case ID/Reference #: [id number]

## 2024-07-09 ENCOUNTER — Ambulatory Visit: Admitting: Endocrinology
